# Patient Record
Sex: Female | Born: 1960 | Race: White | Hispanic: No | Marital: Married | Smoking: Never smoker
Health system: Southern US, Community
[De-identification: ages and names within clinical notes are randomized; demographics above are authoritative.]

## PROBLEM LIST (undated history)

## (undated) DIAGNOSIS — H60543 Acute eczematoid otitis externa, bilateral: Secondary | ICD-10-CM

## (undated) DIAGNOSIS — J45909 Unspecified asthma, uncomplicated: Secondary | ICD-10-CM

## (undated) DIAGNOSIS — E119 Type 2 diabetes mellitus without complications: Secondary | ICD-10-CM

## (undated) DIAGNOSIS — F419 Anxiety disorder, unspecified: Secondary | ICD-10-CM

## (undated) DIAGNOSIS — R112 Nausea with vomiting, unspecified: Secondary | ICD-10-CM

## (undated) DIAGNOSIS — H9190 Unspecified hearing loss, unspecified ear: Secondary | ICD-10-CM

## (undated) DIAGNOSIS — E039 Hypothyroidism, unspecified: Secondary | ICD-10-CM

## (undated) DIAGNOSIS — I472 Ventricular tachycardia: Secondary | ICD-10-CM

## (undated) DIAGNOSIS — E785 Hyperlipidemia, unspecified: Secondary | ICD-10-CM

## (undated) DIAGNOSIS — N3090 Cystitis, unspecified without hematuria: Secondary | ICD-10-CM

## (undated) DIAGNOSIS — R000 Tachycardia, unspecified: Secondary | ICD-10-CM

## (undated) DIAGNOSIS — J45991 Cough variant asthma: Principal | ICD-10-CM

## (undated) DIAGNOSIS — N3010 Interstitial cystitis (chronic) without hematuria: Secondary | ICD-10-CM

## (undated) DIAGNOSIS — K219 Gastro-esophageal reflux disease without esophagitis: Secondary | ICD-10-CM

## (undated) DIAGNOSIS — K559 Vascular disorder of intestine, unspecified: Secondary | ICD-10-CM

## (undated) DIAGNOSIS — F329 Major depressive disorder, single episode, unspecified: Secondary | ICD-10-CM

## (undated) DIAGNOSIS — M109 Gout, unspecified: Secondary | ICD-10-CM

## (undated) DIAGNOSIS — N809 Endometriosis, unspecified: Secondary | ICD-10-CM

## (undated) DIAGNOSIS — R0600 Dyspnea, unspecified: Secondary | ICD-10-CM

## (undated) DIAGNOSIS — M8580 Other specified disorders of bone density and structure, unspecified site: Secondary | ICD-10-CM

## (undated) DIAGNOSIS — M797 Fibromyalgia: Secondary | ICD-10-CM

## (undated) DIAGNOSIS — N939 Abnormal uterine and vaginal bleeding, unspecified: Secondary | ICD-10-CM

## (undated) DIAGNOSIS — Z9889 Other specified postprocedural states: Secondary | ICD-10-CM

## (undated) DIAGNOSIS — H269 Unspecified cataract: Secondary | ICD-10-CM

## (undated) DIAGNOSIS — M3500 Sicca syndrome, unspecified: Secondary | ICD-10-CM

## (undated) DIAGNOSIS — E7800 Pure hypercholesterolemia, unspecified: Secondary | ICD-10-CM

## (undated) DIAGNOSIS — H8109 Meniere's disease, unspecified ear: Secondary | ICD-10-CM

## (undated) DIAGNOSIS — Z974 Presence of external hearing-aid: Secondary | ICD-10-CM

## (undated) DIAGNOSIS — I493 Ventricular premature depolarization: Secondary | ICD-10-CM

## (undated) DIAGNOSIS — J189 Pneumonia, unspecified organism: Secondary | ICD-10-CM

## (undated) DIAGNOSIS — I499 Cardiac arrhythmia, unspecified: Secondary | ICD-10-CM

## (undated) DIAGNOSIS — I4729 Other ventricular tachycardia: Secondary | ICD-10-CM

## (undated) DIAGNOSIS — I10 Essential (primary) hypertension: Secondary | ICD-10-CM

## (undated) DIAGNOSIS — H9319 Tinnitus, unspecified ear: Secondary | ICD-10-CM

## (undated) DIAGNOSIS — K589 Irritable bowel syndrome without diarrhea: Secondary | ICD-10-CM

## (undated) DIAGNOSIS — E079 Disorder of thyroid, unspecified: Secondary | ICD-10-CM

## (undated) DIAGNOSIS — E240 Pituitary-dependent Cushing's disease: Secondary | ICD-10-CM

## (undated) DIAGNOSIS — M1990 Unspecified osteoarthritis, unspecified site: Secondary | ICD-10-CM

## (undated) DIAGNOSIS — J302 Other seasonal allergic rhinitis: Secondary | ICD-10-CM

## (undated) DIAGNOSIS — F32A Depression, unspecified: Secondary | ICD-10-CM

## (undated) HISTORY — DX: Disorder of thyroid, unspecified: E07.9

## (undated) HISTORY — PX: ENDOMETRIAL BIOPSY: SHX622

## (undated) HISTORY — PX: DILATION AND CURETTAGE OF UTERUS: SHX78

## (undated) HISTORY — PX: EYE SURGERY: SHX253

## (undated) HISTORY — PX: STAPEDECTOMY: SHX2435

## (undated) HISTORY — PX: OTHER SURGICAL HISTORY: SHX169

## (undated) HISTORY — PX: NASAL SEPTUM SURGERY: SHX37

## (undated) HISTORY — PX: TUBAL LIGATION: SHX77

## (undated) HISTORY — PX: CYSTOSCOPY: SUR368

## (undated) HISTORY — DX: Depression, unspecified: F32.A

## (undated) HISTORY — DX: Endometriosis, unspecified: N80.9

## (undated) HISTORY — DX: Unspecified asthma, uncomplicated: J45.909

## (undated) HISTORY — PX: TONSILLECTOMY AND ADENOIDECTOMY: SUR1326

## (undated) HISTORY — PX: DIAGNOSTIC LAPAROSCOPY: SUR761

## (undated) HISTORY — DX: Irritable bowel syndrome, unspecified: K58.9

## (undated) HISTORY — DX: Cough variant asthma: J45.991

## (undated) HISTORY — DX: Abnormal uterine and vaginal bleeding, unspecified: N93.9

## (undated) HISTORY — PX: TONSILLECTOMY: SUR1361

## (undated) HISTORY — PX: WISDOM TOOTH EXTRACTION: SHX21

## (undated) HISTORY — DX: Major depressive disorder, single episode, unspecified: F32.9

## (undated) HISTORY — DX: Unspecified osteoarthritis, unspecified site: M19.90

## (undated) HISTORY — DX: Hyperlipidemia, unspecified: E78.5

## (undated) HISTORY — DX: Morbid (severe) obesity due to excess calories: E66.01

## (undated) HISTORY — DX: Anxiety disorder, unspecified: F41.9

## (undated) HISTORY — DX: Essential (primary) hypertension: I10

---

## 1982-05-23 DIAGNOSIS — M350A Sjogren syndrome with glomerular disease: Secondary | ICD-10-CM | POA: Insufficient documentation

## 1983-12-24 HISTORY — PX: MYELOGRAM: SHX5347

## 1993-12-23 DIAGNOSIS — R000 Tachycardia, unspecified: Secondary | ICD-10-CM | POA: Insufficient documentation

## 1998-04-27 ENCOUNTER — Other Ambulatory Visit: Admission: RE | Admit: 1998-04-27 | Discharge: 1998-04-27 | Payer: Self-pay | Admitting: *Deleted

## 1999-05-10 ENCOUNTER — Other Ambulatory Visit: Admission: RE | Admit: 1999-05-10 | Discharge: 1999-05-10 | Payer: Self-pay | Admitting: *Deleted

## 2000-05-26 ENCOUNTER — Other Ambulatory Visit: Admission: RE | Admit: 2000-05-26 | Discharge: 2000-05-26 | Payer: Self-pay | Admitting: *Deleted

## 2000-10-07 ENCOUNTER — Ambulatory Visit (HOSPITAL_COMMUNITY): Admission: RE | Admit: 2000-10-07 | Discharge: 2000-10-07 | Payer: Self-pay | Admitting: Emergency Medicine

## 2001-02-13 ENCOUNTER — Encounter: Payer: Self-pay | Admitting: Internal Medicine

## 2001-02-13 ENCOUNTER — Inpatient Hospital Stay (HOSPITAL_COMMUNITY): Admission: EM | Admit: 2001-02-13 | Discharge: 2001-02-14 | Payer: Self-pay | Admitting: Emergency Medicine

## 2001-02-13 ENCOUNTER — Encounter: Payer: Self-pay | Admitting: Emergency Medicine

## 2001-05-13 ENCOUNTER — Other Ambulatory Visit: Admission: RE | Admit: 2001-05-13 | Discharge: 2001-05-13 | Payer: Self-pay | Admitting: *Deleted

## 2001-07-31 ENCOUNTER — Encounter (INDEPENDENT_AMBULATORY_CARE_PROVIDER_SITE_OTHER): Payer: Self-pay | Admitting: Specialist

## 2001-07-31 ENCOUNTER — Other Ambulatory Visit: Admission: RE | Admit: 2001-07-31 | Discharge: 2001-07-31 | Payer: Self-pay | Admitting: Otolaryngology

## 2002-01-28 ENCOUNTER — Encounter: Admission: RE | Admit: 2002-01-28 | Discharge: 2002-01-28 | Payer: Self-pay | Admitting: Emergency Medicine

## 2002-01-28 ENCOUNTER — Encounter: Payer: Self-pay | Admitting: Emergency Medicine

## 2002-04-14 ENCOUNTER — Other Ambulatory Visit: Admission: RE | Admit: 2002-04-14 | Discharge: 2002-04-14 | Payer: Self-pay | Admitting: *Deleted

## 2003-04-06 ENCOUNTER — Other Ambulatory Visit: Admission: RE | Admit: 2003-04-06 | Discharge: 2003-04-06 | Payer: Self-pay | Admitting: *Deleted

## 2004-05-03 ENCOUNTER — Other Ambulatory Visit: Admission: RE | Admit: 2004-05-03 | Discharge: 2004-05-03 | Payer: Self-pay | Admitting: *Deleted

## 2005-05-28 ENCOUNTER — Other Ambulatory Visit: Admission: RE | Admit: 2005-05-28 | Discharge: 2005-05-28 | Payer: Self-pay | Admitting: *Deleted

## 2006-07-16 ENCOUNTER — Other Ambulatory Visit: Admission: RE | Admit: 2006-07-16 | Discharge: 2006-07-16 | Payer: Self-pay | Admitting: *Deleted

## 2007-06-05 ENCOUNTER — Other Ambulatory Visit: Admission: RE | Admit: 2007-06-05 | Discharge: 2007-06-05 | Payer: Self-pay | Admitting: *Deleted

## 2008-06-06 ENCOUNTER — Other Ambulatory Visit: Admission: RE | Admit: 2008-06-06 | Discharge: 2008-06-06 | Payer: Self-pay | Admitting: Obstetrics and Gynecology

## 2008-12-23 DIAGNOSIS — H9313 Tinnitus, bilateral: Secondary | ICD-10-CM | POA: Insufficient documentation

## 2009-06-08 ENCOUNTER — Other Ambulatory Visit: Admission: RE | Admit: 2009-06-08 | Discharge: 2009-06-08 | Payer: Self-pay | Admitting: Obstetrics and Gynecology

## 2010-06-05 ENCOUNTER — Other Ambulatory Visit: Admission: RE | Admit: 2010-06-05 | Discharge: 2010-06-05 | Payer: Self-pay | Admitting: Obstetrics and Gynecology

## 2011-06-06 ENCOUNTER — Other Ambulatory Visit: Payer: Self-pay | Admitting: Obstetrics and Gynecology

## 2011-06-06 ENCOUNTER — Other Ambulatory Visit (HOSPITAL_COMMUNITY)
Admission: RE | Admit: 2011-06-06 | Discharge: 2011-06-06 | Disposition: A | Discharge status: Home / Self Care | Payer: PRIVATE HEALTH INSURANCE | Source: Ambulatory Visit | Attending: Obstetrics and Gynecology | Admitting: Obstetrics and Gynecology

## 2011-06-06 DIAGNOSIS — Z01419 Encounter for gynecological examination (general) (routine) without abnormal findings: Secondary | ICD-10-CM | POA: Insufficient documentation

## 2012-05-13 DIAGNOSIS — N3010 Interstitial cystitis (chronic) without hematuria: Secondary | ICD-10-CM | POA: Insufficient documentation

## 2012-05-13 DIAGNOSIS — R109 Unspecified abdominal pain: Secondary | ICD-10-CM | POA: Insufficient documentation

## 2012-05-13 DIAGNOSIS — R350 Frequency of micturition: Secondary | ICD-10-CM | POA: Insufficient documentation

## 2012-05-13 DIAGNOSIS — N3020 Other chronic cystitis without hematuria: Secondary | ICD-10-CM | POA: Insufficient documentation

## 2012-05-13 DIAGNOSIS — K589 Irritable bowel syndrome without diarrhea: Secondary | ICD-10-CM | POA: Insufficient documentation

## 2012-05-13 DIAGNOSIS — IMO0001 Reserved for inherently not codable concepts without codable children: Secondary | ICD-10-CM | POA: Insufficient documentation

## 2012-05-13 DIAGNOSIS — R300 Dysuria: Secondary | ICD-10-CM | POA: Insufficient documentation

## 2012-05-13 DIAGNOSIS — R3915 Urgency of urination: Secondary | ICD-10-CM | POA: Insufficient documentation

## 2012-06-08 ENCOUNTER — Other Ambulatory Visit: Payer: Self-pay | Admitting: Obstetrics and Gynecology

## 2012-06-08 ENCOUNTER — Other Ambulatory Visit (HOSPITAL_COMMUNITY)
Admission: RE | Admit: 2012-06-08 | Discharge: 2012-06-08 | Disposition: A | Discharge status: Home / Self Care | Payer: PRIVATE HEALTH INSURANCE | Source: Ambulatory Visit | Attending: Obstetrics and Gynecology | Admitting: Obstetrics and Gynecology

## 2012-06-08 DIAGNOSIS — Z1159 Encounter for screening for other viral diseases: Secondary | ICD-10-CM | POA: Insufficient documentation

## 2012-06-08 DIAGNOSIS — Z01419 Encounter for gynecological examination (general) (routine) without abnormal findings: Secondary | ICD-10-CM | POA: Insufficient documentation

## 2012-07-08 DIAGNOSIS — H109 Unspecified conjunctivitis: Secondary | ICD-10-CM | POA: Insufficient documentation

## 2012-07-08 DIAGNOSIS — IMO0002 Reserved for concepts with insufficient information to code with codable children: Secondary | ICD-10-CM | POA: Insufficient documentation

## 2012-08-12 DIAGNOSIS — H04123 Dry eye syndrome of bilateral lacrimal glands: Secondary | ICD-10-CM | POA: Insufficient documentation

## 2012-09-25 ENCOUNTER — Other Ambulatory Visit: Payer: Self-pay | Admitting: Otolaryngology

## 2012-09-25 DIAGNOSIS — H905 Unspecified sensorineural hearing loss: Secondary | ICD-10-CM

## 2012-09-25 DIAGNOSIS — H908 Mixed conductive and sensorineural hearing loss, unspecified: Secondary | ICD-10-CM

## 2012-10-06 ENCOUNTER — Ambulatory Visit
Admission: RE | Admit: 2012-10-06 | Discharge: 2012-10-06 | Disposition: A | Discharge status: Home / Self Care | Payer: BC Managed Care – PPO | Source: Ambulatory Visit | Attending: Otolaryngology | Admitting: Otolaryngology

## 2012-10-06 DIAGNOSIS — H905 Unspecified sensorineural hearing loss: Secondary | ICD-10-CM

## 2012-10-06 DIAGNOSIS — H908 Mixed conductive and sensorineural hearing loss, unspecified: Secondary | ICD-10-CM

## 2012-10-06 MED ORDER — GADOBENATE DIMEGLUMINE 529 MG/ML IV SOLN
20.0000 mL | Freq: Once | INTRAVENOUS | Status: AC | PRN
  Administered 2012-10-06: 20 mL via INTRAVENOUS

## 2012-12-23 HISTORY — PX: OTHER SURGICAL HISTORY: SHX169

## 2013-06-10 ENCOUNTER — Other Ambulatory Visit: Payer: Self-pay | Admitting: Obstetrics and Gynecology

## 2013-06-10 ENCOUNTER — Other Ambulatory Visit (HOSPITAL_COMMUNITY)
Admission: RE | Admit: 2013-06-10 | Discharge: 2013-06-10 | Disposition: A | Discharge status: Home / Self Care | Payer: BC Managed Care – PPO | Source: Ambulatory Visit | Attending: Obstetrics and Gynecology | Admitting: Obstetrics and Gynecology

## 2013-06-10 DIAGNOSIS — Z01419 Encounter for gynecological examination (general) (routine) without abnormal findings: Secondary | ICD-10-CM | POA: Insufficient documentation

## 2013-12-23 DIAGNOSIS — E119 Type 2 diabetes mellitus without complications: Secondary | ICD-10-CM | POA: Insufficient documentation

## 2014-06-07 ENCOUNTER — Encounter: Payer: BC Managed Care – PPO | Attending: Family Medicine

## 2014-06-07 VITALS — Ht 66.0 in | Wt 353.9 lb

## 2014-06-07 DIAGNOSIS — E119 Type 2 diabetes mellitus without complications: Secondary | ICD-10-CM | POA: Diagnosis present

## 2014-06-07 DIAGNOSIS — Z713 Dietary counseling and surveillance: Secondary | ICD-10-CM | POA: Diagnosis not present

## 2014-06-13 ENCOUNTER — Other Ambulatory Visit: Payer: Self-pay | Admitting: Obstetrics and Gynecology

## 2014-06-13 ENCOUNTER — Other Ambulatory Visit (HOSPITAL_COMMUNITY)
Admission: RE | Admit: 2014-06-13 | Discharge: 2014-06-13 | Disposition: A | Discharge status: Home / Self Care | Payer: BC Managed Care – PPO | Source: Ambulatory Visit | Attending: Obstetrics and Gynecology | Admitting: Obstetrics and Gynecology

## 2014-06-13 DIAGNOSIS — Z01419 Encounter for gynecological examination (general) (routine) without abnormal findings: Secondary | ICD-10-CM | POA: Insufficient documentation

## 2014-06-14 DIAGNOSIS — E119 Type 2 diabetes mellitus without complications: Secondary | ICD-10-CM | POA: Diagnosis not present

## 2014-06-14 LAB — CYTOLOGY - PAP

## 2014-06-14 NOTE — Progress Notes (Signed)
Patient was seen on 06/07/14 for the first of a series of three diabetes self-management courses at the [facility].  Current HbA1c: 6.6%  The following learning objectives were met by the patient during this class:  Describe diabetes  State some common risk factors for diabetes  Defines the role of glucose and insulin  Identifies type of diabetes and pathophysiology  Describe the relationship between diabetes and cardiovascular risk  State the members of the Healthcare Team  States the rationale for glucose monitoring  State when to test glucose  State their individual Target Range  State the importance of logging glucose readings  Describe how to interpret glucose readings  Identifies A1C target  Explain the correlation between A1c and eAG values  State symptoms and treatment of high blood glucose  State symptoms and treatment of low blood glucose  Explain proper technique for glucose testing  Identifies proper sharps disposal  Handouts given during class include:  Living Well with Diabetes book  Carb Counting and Meal Planning book  Meal Plan Card  Carbohydrate guide  Meal planning worksheet  Low Sodium Flavoring Tips  The diabetes portion plate  A1c to eAG Conversion Chart  Diabetes Medications  Diabetes Recommended Care Schedule  Support Group  Diabetes Success Plan  Core Class Satisfaction Survey  Follow-Up Plan:  Attend core 2    

## 2014-06-14 NOTE — Progress Notes (Signed)

## 2014-06-21 DIAGNOSIS — E119 Type 2 diabetes mellitus without complications: Secondary | ICD-10-CM | POA: Diagnosis not present

## 2014-06-21 NOTE — Progress Notes (Signed)
Patient was seen on 06/21/14 for the third of a series of three diabetes self-management courses at the [facility]. The following learning objectives were met by the patient during this class:    State the amount of activity recommended for healthy living   Describe activities suitable for individual needs   Identify ways to regularly incorporate activity into daily life   Identify barriers to activity and ways to over come these barriers  Identify diabetes medications being personally used and their primary action for lowering glucose and possible side effects   Describe role of stress on blood glucose and develop strategies to address psychosocial issues   Identify diabetes complications and ways to prevent them  Explain how to manage diabetes during illness   Evaluate success in meeting personal goal   Establish 2-3 goals that they will plan to diligently work on until they return for the  57-monthfollow-up visit  Goals:  Follow Diabetes Meal Plan as instructed  Aim for 15-30 mins of physical activity daily as tolerated  Bring food record and glucose log to your follow up visit  Your patient has established the following 4 month goals in their individualized success plan: I will increase my activity level at least 3 days a week I will take my diabetes medications as scheduled, Metformin after meals instead of before  Your patient has identified these potential barriers to change:  Osteoarthritis in knees is obstacle for exercise  Your patient has identified their diabetes self-care support plan as  [facility]  Doctor, co-workers and husband  Plan:  Attend Core 4 in 4 months

## 2014-10-24 ENCOUNTER — Encounter: Payer: BC Managed Care – PPO | Attending: Family Medicine

## 2014-10-24 DIAGNOSIS — Z713 Dietary counseling and surveillance: Secondary | ICD-10-CM | POA: Insufficient documentation

## 2014-10-24 DIAGNOSIS — E119 Type 2 diabetes mellitus without complications: Secondary | ICD-10-CM | POA: Diagnosis present

## 2014-10-24 NOTE — Progress Notes (Signed)
Appt start time: 1730 end time:  1830.  Patient was seen on 10/24/14 for a review of the series of three diabetes self-management courses at the [facility]. The following learning objectives were met by the patient during this class:  . Reviewed blood glucose monitoring and interpretation including the recommended target ranges and Hgb A1c.  . Reviewed on carb counting, importance of regularly scheduled meals/snacks, and meal planning.  . Reviewed the effects of physical activity on glucose levels and long-term glucose control.  Recommended goal of 150 minutes of physical activity/week. . Reviewed patient medications and discussed role of medication on blood glucose and possible side effects. . Discussed strategies to manage stress, psychosocial issues, and other obstacles to diabetes management. . Encouraged moderate weight reduction to improve glucose levels.   . Reviewed short-term complications: hyper- and hypo-glycemia.  Discussed causes, symptoms, and treatment options. . Reviewed prevention, detection, and treatment of long-term complications.  Discussed the role of prolonged elevated glucose levels on body systems.  Goals:  Follow Diabetes Meal Plan as instructed  Eat 3 meals and 2 snacks, every 3-5 hrs  Limit carbohydrate intake to 45 grams carbohydrate/meal Limit carbohydrate intake to 15 grams carbohydrate/snack Add lean protein foods to meals/snacks  Monitor glucose levels as instructed by your doctor  Aim for goal of 15-30 mins of physical activity daily as tolerated  Bring food record and glucose log to your next nutrition visit   

## 2015-06-14 ENCOUNTER — Other Ambulatory Visit: Payer: Self-pay | Admitting: Obstetrics and Gynecology

## 2015-06-14 ENCOUNTER — Other Ambulatory Visit (HOSPITAL_COMMUNITY)
Admission: RE | Admit: 2015-06-14 | Discharge: 2015-06-14 | Disposition: A | Discharge status: Home / Self Care | Payer: BLUE CROSS/BLUE SHIELD | Source: Ambulatory Visit | Attending: Obstetrics and Gynecology | Admitting: Obstetrics and Gynecology

## 2015-06-14 DIAGNOSIS — Z01419 Encounter for gynecological examination (general) (routine) without abnormal findings: Secondary | ICD-10-CM | POA: Diagnosis not present

## 2015-06-16 LAB — CYTOLOGY - PAP

## 2015-06-20 ENCOUNTER — Other Ambulatory Visit: Payer: Self-pay | Admitting: Family Medicine

## 2015-06-20 ENCOUNTER — Ambulatory Visit
Admission: RE | Admit: 2015-06-20 | Discharge: 2015-06-20 | Disposition: A | Discharge status: Home / Self Care | Payer: BLUE CROSS/BLUE SHIELD | Source: Ambulatory Visit | Attending: Family Medicine | Admitting: Family Medicine

## 2015-06-20 DIAGNOSIS — R059 Cough, unspecified: Secondary | ICD-10-CM

## 2015-06-20 DIAGNOSIS — R05 Cough: Secondary | ICD-10-CM

## 2016-05-23 ENCOUNTER — Other Ambulatory Visit: Payer: Self-pay | Admitting: Nurse Practitioner

## 2016-05-23 DIAGNOSIS — N950 Postmenopausal bleeding: Secondary | ICD-10-CM

## 2016-05-28 ENCOUNTER — Other Ambulatory Visit: Payer: BLUE CROSS/BLUE SHIELD

## 2016-05-31 ENCOUNTER — Other Ambulatory Visit: Payer: BLUE CROSS/BLUE SHIELD

## 2016-07-11 ENCOUNTER — Other Ambulatory Visit (HOSPITAL_COMMUNITY)
Admission: RE | Admit: 2016-07-11 | Discharge: 2016-07-11 | Disposition: A | Discharge status: Home / Self Care | Payer: PRIVATE HEALTH INSURANCE | Source: Ambulatory Visit | Attending: Obstetrics and Gynecology | Admitting: Obstetrics and Gynecology

## 2016-07-11 ENCOUNTER — Other Ambulatory Visit: Payer: Self-pay | Admitting: Obstetrics and Gynecology

## 2016-07-11 DIAGNOSIS — Z01419 Encounter for gynecological examination (general) (routine) without abnormal findings: Secondary | ICD-10-CM | POA: Insufficient documentation

## 2016-07-11 DIAGNOSIS — Z1151 Encounter for screening for human papillomavirus (HPV): Secondary | ICD-10-CM | POA: Diagnosis present

## 2016-07-12 LAB — CYTOLOGY - PAP

## 2016-07-25 ENCOUNTER — Ambulatory Visit
Admission: RE | Admit: 2016-07-25 | Discharge: 2016-07-25 | Disposition: A | Discharge status: Home / Self Care | Payer: PRIVATE HEALTH INSURANCE | Source: Ambulatory Visit | Attending: Nurse Practitioner | Admitting: Nurse Practitioner

## 2016-07-25 DIAGNOSIS — N950 Postmenopausal bleeding: Secondary | ICD-10-CM

## 2016-10-24 ENCOUNTER — Ambulatory Visit
Admission: RE | Admit: 2016-10-24 | Discharge: 2016-10-24 | Disposition: A | Discharge status: Home / Self Care | Payer: PRIVATE HEALTH INSURANCE | Source: Ambulatory Visit | Attending: Chiropractor | Admitting: Chiropractor

## 2016-10-24 ENCOUNTER — Other Ambulatory Visit: Payer: Self-pay | Admitting: Chiropractor

## 2016-10-24 DIAGNOSIS — M542 Cervicalgia: Secondary | ICD-10-CM

## 2016-10-24 DIAGNOSIS — M545 Low back pain: Secondary | ICD-10-CM

## 2018-01-08 ENCOUNTER — Telehealth (HOSPITAL_COMMUNITY): Payer: Self-pay | Admitting: Family Medicine

## 2018-01-09 ENCOUNTER — Other Ambulatory Visit: Payer: Self-pay | Admitting: Family Medicine

## 2018-01-09 DIAGNOSIS — R0602 Shortness of breath: Secondary | ICD-10-CM

## 2018-01-12 NOTE — Telephone Encounter (Signed)
User: Cherie Dark A Date/time: 01/08/18 3:33 PM  Comment: Called pt and lmsg for her to CB to get sch for echo.Vassie Moment  Context:  Outcome: Left Message  Phone number: [phone] Phone Type: Home Phone  Comm. type: Telephone Call type: Outgoing  Contact: Owens Shark, Israa H Relation to patient: Self

## 2018-01-16 ENCOUNTER — Ambulatory Visit (HOSPITAL_COMMUNITY): Payer: PRIVATE HEALTH INSURANCE | Attending: Internal Medicine

## 2018-01-16 ENCOUNTER — Other Ambulatory Visit: Payer: Self-pay

## 2018-01-16 DIAGNOSIS — E785 Hyperlipidemia, unspecified: Secondary | ICD-10-CM | POA: Insufficient documentation

## 2018-01-16 DIAGNOSIS — R0602 Shortness of breath: Secondary | ICD-10-CM | POA: Diagnosis present

## 2018-01-16 DIAGNOSIS — I119 Hypertensive heart disease without heart failure: Secondary | ICD-10-CM | POA: Insufficient documentation

## 2018-01-16 DIAGNOSIS — Z6841 Body Mass Index (BMI) 40.0 and over, adult: Secondary | ICD-10-CM | POA: Diagnosis not present

## 2018-01-16 DIAGNOSIS — E119 Type 2 diabetes mellitus without complications: Secondary | ICD-10-CM | POA: Insufficient documentation

## 2018-04-02 ENCOUNTER — Encounter: Payer: Self-pay | Admitting: Internal Medicine

## 2018-04-02 ENCOUNTER — Ambulatory Visit (INDEPENDENT_AMBULATORY_CARE_PROVIDER_SITE_OTHER): Payer: PRIVATE HEALTH INSURANCE | Admitting: Internal Medicine

## 2018-04-02 ENCOUNTER — Ambulatory Visit (INDEPENDENT_AMBULATORY_CARE_PROVIDER_SITE_OTHER)
Admission: RE | Admit: 2018-04-02 | Discharge: 2018-04-02 | Disposition: A | Discharge status: Home / Self Care | Payer: PRIVATE HEALTH INSURANCE | Source: Ambulatory Visit | Attending: Internal Medicine | Admitting: Internal Medicine

## 2018-04-02 VITALS — BP 126/64 | HR 100 | Ht 66.5 in | Wt 381.0 lb

## 2018-04-02 DIAGNOSIS — J45991 Cough variant asthma: Secondary | ICD-10-CM | POA: Diagnosis not present

## 2018-04-02 DIAGNOSIS — R0609 Other forms of dyspnea: Secondary | ICD-10-CM | POA: Diagnosis not present

## 2018-04-02 HISTORY — DX: Cough variant asthma: J45.991

## 2018-04-02 LAB — NITRIC OXIDE: Nitric Oxide: 11

## 2018-04-02 MED ORDER — BUDESONIDE-FORMOTEROL FUMARATE 80-4.5 MCG/ACT IN AERO: 2.0000 | INHALATION_SPRAY | Freq: Two times a day (BID) | RESPIRATORY_TRACT | 11 refills | Status: DC

## 2018-04-02 NOTE — Patient Instructions (Addendum)
Stop advair   Start Symbicort 80 Take 1  puffs first thing in am and then another 1 puffs about 12 hours later   Work on inhaler technique:  relax and gently blow all the way out then take a nice smooth deep breath back in, triggering the inhaler at same time you start breathing in.  Hold for up to 5 seconds if you can. Blow out thru nose. Rinse and gargle with water when done     Only use your albuterol as a rescue medication to be used if you can't catch your breath by resting or doing a relaxed purse lip breathing pattern.  - The less you use it, the better it will work when you need it. - Ok to use up to 2 puffs  every 4 hours if you must but call for immediate appointment if use goes up over your usual need - Don't leave home without it !!  (think of it like the spare tire for your car)    GERD (REFLUX)  is an extremely common cause of respiratory symptoms just like yours , many times with no obvious heartburn at all.    It can be treated with medication, but also with lifestyle changes including elevation of the head of your bed (ideally with 6 inch  bed blocks),  Smoking cessation, avoidance of late meals, excessive alcohol, and avoid fatty foods, chocolate, peppermint, colas, red wine, and acidic juices such as orange juice.  NO MINT OR MENTHOL PRODUCTS SO NO COUGH DROPS   USE SUGARLESS CANDY INSTEAD (Jolley ranchers or Stover's or Life Savers) or even ice chips will also do - the key is to swallow to prevent all throat clearing. NO OIL BASED VITAMINS - use powdered substitutes.    Please schedule a follow up office visit in 4 weeks, sooner if needed with pfts on returnfd

## 2018-04-02 NOTE — Progress Notes (Signed)
Subjective:     Patient ID: Kelly Thornton, female   DOB: 11/03/1961,     MRN: [id number]  HPI  57 yowf never smoker works as Glass blower/designer for [employer] with sinus problems at age 57 = unable to breath thru nose but subsequently pattern of freq sinusitis developed req rx up to 4 x years then around age 57 developed more of a chronic asthma pattern on advair around 2001 and on it ever since and starting in Sept 2018 more doe so referred to pulmonary clinic 04/02/2018 by Dr   Rosario Jacks for Sjogren's     04/02/2018 1st [facility] office visit/ Meshilem Machuca   Chief Complaint  Patient presents with  . Pulmonary Consult    Referred by Dr. [name]. Pt states she was dxed with Asthma in 1990. She has had increased SOB and wheezing x 8 months.   new onset sob assoc with throat clearing  X sept 2018 / ok sleeping on side  Also chronic dry mouth secondary to Sjogren's Using rescue inhaler once a month now avg  twice daily / worse with lysol exp  Doe = MMRC3 = can't walk 100 yards even at a slow pace at a flat grade s stopping due to sob                                                No obvious patterns in  day to day or daytime variability or assoc excess/ purulent sputum or mucus plugs or hemoptysis or cp or chest tightness,   or overt sinus or hb symptoms. No unusual exposure hx or h/o childhood pna/ asthma or knowledge of premature birth.  Sleeping  Ok flat  without nocturnal  or early am exacerbation  of respiratory  c/o's or need for noct saba. Also denies any obvious fluctuation of symptoms with weather or environmental changes or other aggravating or alleviating factors except as outlined above   Current Allergies, Complete Past Medical History, Past Surgical History, Family History, and Social History were reviewed in [facility] record.  ROS  The following are not active complaints unless bolded Hoarseness, sore throat, dysphagia, dental problems, itching,  sneezing,  nasal congestion or discharge of excess mucus or purulent secretions, ear ache,   fever, chills, sweats, unintended wt loss or wt gain, classically pleuritic or exertional cp,  orthopnea pnd or arm/hand swelling  or leg swelling, presyncope, palpitations, abdominal pain, anorexia, nausea, vomiting, diarrhea  or change in bowel habits or change in bladder habits, change in stools or change in urine, dysuria, hematuria,  rash, arthralgias, visual complaints, headache, numbness, weakness or ataxia or problems with walking or coordination,  change in mood or  memory.        Current Meds  Medication Sig  . albuterol (ACCUNEB) 0.63 MG/3ML nebulizer solution Take 1 ampule by nebulization 3 (three) times daily as needed for wheezing.  [name] albuterol (PROVENTIL HFA;VENTOLIN HFA) 108 (90 BASE) MCG/ACT inhaler Inhale 2 puffs into the lungs every 6 (six) hours as needed for wheezing or shortness of breath.  [name] amitriptyline (ELAVIL) 100 MG tablet Take 100 mg by mouth at bedtime.  . cetirizine (ZYRTEC) 10 MG tablet Take 10 mg by mouth daily.  . cholecalciferol (VITAMIN D) 1000 UNITS tablet Take 2,000 Units by mouth daily.  [name] EPINEPHrine 0.3 mg/0.3  mL IJ SOAJ injection Inject into the muscle as needed.  . Flaxseed, Linseed, (FLAXSEED OIL PO) Take 1 capsule by mouth daily.  . fluocinonide cream (LIDEX) 8.92 % Apply 1 application topically as directed.  . Fluticasone-Salmeterol (ADVAIR) 250-50 MCG/DOSE AEPB Inhale 1 puff into the lungs 2 (two) times daily.  [name] levothyroxine (SYNTHROID, LEVOTHROID) 175 MCG tablet Take 175 mcg by mouth daily before breakfast.  . metFORMIN (GLUCOPHAGE) 500 MG tablet Take 500 mg by mouth daily with breakfast.  . Multiple Vitamin (MULTIVITAMIN) capsule Take 1 capsule by mouth daily.  [name] NASAL SALINE NA Place into the nose as needed.  . pentosan polysulfate (ELMIRON) 100 MG capsule Take 200 mg by mouth 2 (two) times daily.  . rosuvastatin (CRESTOR) 5 MG tablet Take 5 mg by mouth  daily.  [name] triamterene-hydrochlorothiazide (DYAZIDE) 37.5-25 MG capsule Take 1 capsule by mouth daily.  [name] venlafaxine XR (EFFEXOR-XR) 150 MG 24 hr capsule Take 150 mg by mouth daily with breakfast.       Review of Systems     Objective:   Physical Exam   Obese very pleasant amb wf nad but freq vigorous throat clearing   Wt Readings from Last 3 Encounters:  04/02/18 (!) 381 lb (172.8 kg)  06/14/14 (!) 353 lb 14.4 oz (160.5 kg)     Vital signs reviewed - Note on arrival 02 sats  98% on RA      HEENT: nl dentition, turbinates bilaterally, and oropharynx. Nl external ear canals without cough reflex   NECK :  without JVD/Nodes/TM/ nl carotid upstrokes bilaterally   LUNGS: no acc muscle use,  Nl contour chest which is clear to A and P bilaterally without cough on insp or exp maneuvers   CV:  RRR  no s3 or murmur or increase in P2, and  Trace sym Pitting edema both LEs  ABD:  Massively obese  nontender with limited inspiratory excursion . No bruits or organomegaly appreciated, bowel sounds nl  MS:  Nl gait/ ext warm without deformities, calf tenderness, cyanosis or clubbing No obvious joint restrictions   SKIN: warm and dry without lesions    NEURO:  alert, approp, nl sensorium with  no motor or cerebellar deficits apparent.    CXR PA and Lateral:   04/02/2018 :    I personally reviewed images and impression as follows:    cardiomegaly with non-specific mild increase in lung markings.    Assessment:

## 2018-04-02 NOTE — Assessment & Plan Note (Signed)
FENO 04/02/2018  =   11 on advair 250 one bid - Spirometry 04/02/2018  FEV1 2.39 (84%)  Ratio 87 with truncation of peak flow p advair prior  - 04/02/2018  After extensive coaching inhaler device  effectiveness =    75% from a baseline of 50% so try symbicort 80  1-2 bid    Symptoms are refractory and disproportionate to objective findings with DDX of  difficult airways management almost all start with A and  include Adherence, Ace Inhibitors, Acid Reflux, Active Sinus Disease, Alpha 1 Antitripsin deficiency, Anxiety masquerading as Airways dz,  ABPA,  Allergy(esp in young), Aspiration (esp in elderly), Adverse effects of meds,  Active smokers, A bunch of PE's (a small clot burden can't cause this syndrome unless there is already severe underlying pulm or vascular dz with poor reserve) plus two Bs  = Bronchiectasis and Beta blocker use..and one C= CHF      Adherence is always the initial "prime suspect" and is a multilayered concern that requires a "trust but verify" approach in every patient - starting with knowing how to use medications, especially inhalers, correctly, keeping up with refills and understanding the fundamental difference between maintenance and prns vs those medications only taken for a very short course and then stopped and not refilled.  - see hfa teaching    ? Adverse effects of dpi > try off advair dpi and on low dose ICS  symb 80 one bid   ? ? Acid (or non-acid) GERD > always difficult to exclude as up to 75% of pts in some series report no assoc GI/ Heartburn symptoms> rec  diet restrictions/ reviewed and instructions given in writing > consider adding ppi ac  Next   ? Active sinus dz > throat clearing is way in excess of pnds/ f/u but Wilburn Cornelia planned    ? Allergy > consider allergy testing next ov    ? Anxiety / VCD > usually at the bottom of this list of usual suspects but should be much higher on this pt's based on H and P and note already on psychotropics and may  interfere with adherence and also interpretation of response or lack thereof to symptom management which can be quite subjective.    She may now have irritable larynx component to her throat clearing but that would be a dx of exclusion.  Will return p 4 weeks for pfts to complete the w/u.   Total time devoted to counseling  > 50 % of initial 60 min office visit:  review case with pt/ discussion of options/alternatives/ personally creating written customized instructions  in presence of pt  then going over those specific  Instructions directly with the pt including how to use all of the meds but in particular covering each new medication in detail and the difference between the maintenance= "automatic" meds and the prns using an action plan format for the latter (If this problem/symptom => do that organization reading Left to right).  Please see AVS from this visit for a full list of these instructions which I personally wrote for this pt and  are unique to this visit.

## 2018-04-03 ENCOUNTER — Telehealth: Payer: Self-pay | Admitting: Internal Medicine

## 2018-04-03 ENCOUNTER — Encounter: Payer: Self-pay | Admitting: Internal Medicine

## 2018-04-03 DIAGNOSIS — R0609 Other forms of dyspnea: Secondary | ICD-10-CM

## 2018-04-03 NOTE — Progress Notes (Signed)
LMTCB

## 2018-04-03 NOTE — Telephone Encounter (Signed)
Called and spoke with pt letting her know the results of the cxr.  Pt expressed understanding. Nothing further needed at this time.

## 2018-04-03 NOTE — Assessment & Plan Note (Addendum)
Left ventricle: The cavity size was normal. Wall thickness was   increased in a pattern of mild LVH. Systolic function was normal.   The estimated ejection fraction was in the range of 55% to 60%.   The study is not technically sufficient to allow evaluation of LV   diastolic function.  Suspect this is multifactorial but clearly not related to airflow obst by today's spriometry - clearly has limiting obesity and  May also  have significant diastolic dysfunction based on today's cxr and will need further eval p correct the upper airway component of her problem (see separate a/p) with full pfts

## 2018-04-03 NOTE — Assessment & Plan Note (Signed)
Body mass index is 60.57 kg/m.  -   No results found for: TSH   Contributing to gerd risk/ doe/reviewed the need and the process to achieve and maintain neg calorie balance > defer f/u primary care including intermittently monitoring thyroid status

## 2018-05-15 ENCOUNTER — Other Ambulatory Visit: Payer: Self-pay | Admitting: Internal Medicine

## 2018-05-15 DIAGNOSIS — J45991 Cough variant asthma: Secondary | ICD-10-CM

## 2018-05-19 ENCOUNTER — Ambulatory Visit (INDEPENDENT_AMBULATORY_CARE_PROVIDER_SITE_OTHER): Payer: PRIVATE HEALTH INSURANCE | Admitting: Internal Medicine

## 2018-05-19 ENCOUNTER — Other Ambulatory Visit (INDEPENDENT_AMBULATORY_CARE_PROVIDER_SITE_OTHER): Payer: PRIVATE HEALTH INSURANCE

## 2018-05-19 ENCOUNTER — Encounter: Payer: Self-pay | Admitting: Internal Medicine

## 2018-05-19 VITALS — BP 126/76 | HR 103 | Ht 67.0 in | Wt 376.0 lb

## 2018-05-19 DIAGNOSIS — R0609 Other forms of dyspnea: Secondary | ICD-10-CM

## 2018-05-19 DIAGNOSIS — J45991 Cough variant asthma: Secondary | ICD-10-CM | POA: Diagnosis not present

## 2018-05-19 LAB — CBC WITH DIFFERENTIAL/PLATELET
BASOS PCT: 0.4 % (ref 0.0–3.0)
Basophils Absolute: 0 10*3/uL (ref 0.0–0.1)
EOS PCT: 0 % (ref 0.0–5.0)
Eosinophils Absolute: 0 10*3/uL (ref 0.0–0.7)
HEMATOCRIT: 42.6 % (ref 36.0–46.0)
HEMOGLOBIN: 14.2 g/dL (ref 12.0–15.0)
LYMPHS PCT: 28.5 % (ref 12.0–46.0)
Lymphs Abs: 2.6 10*3/uL (ref 0.7–4.0)
MCHC: 33.3 g/dL (ref 30.0–36.0)
MCV: 93.4 fl (ref 78.0–100.0)
MONO ABS: 0.7 10*3/uL (ref 0.1–1.0)
MONOS PCT: 7.6 % (ref 3.0–12.0)
Neutro Abs: 5.7 10*3/uL (ref 1.4–7.7)
Neutrophils Relative %: 63.5 % (ref 43.0–77.0)
Platelets: 270 10*3/uL (ref 150.0–400.0)
RBC: 4.56 Mil/uL (ref 3.87–5.11)
RDW: 14.1 % (ref 11.5–15.5)
WBC: 9 10*3/uL (ref 4.0–10.5)

## 2018-05-19 LAB — PULMONARY FUNCTION TEST
DL/VA % PRED: 122 %
DL/VA: 6.29 ml/min/mmHg/L
DLCO unc % pred: 81 %
DLCO unc: 22.95 ml/min/mmHg
FEF 25-75 POST: 4.75 L/s
FEF 25-75 Pre: 3.21 L/sec
FEF2575-%CHANGE-POST: 48 %
FEF2575-%PRED-PRE: 124 %
FEF2575-%Pred-Post: 184 %
FEV1-%Change-Post: 17 %
FEV1-%Pred-Post: 90 %
FEV1-%Pred-Pre: 76 %
FEV1-PRE: 2.22 L
FEV1-Post: 2.6 L
FEV1FVC-%CHANGE-POST: 6 %
FEV1FVC-%Pred-Pre: 109 %
FEV6-%CHANGE-POST: 11 %
FEV6-%Pred-Post: 79 %
FEV6-%Pred-Pre: 71 %
FEV6-Post: 2.87 L
FEV6-Pre: 2.58 L
FEV6FVC-%Pred-Post: 103 %
FEV6FVC-%Pred-Pre: 103 %
FVC-%Change-Post: 10 %
FVC-%Pred-Post: 77 %
FVC-%Pred-Pre: 70 %
FVC-Post: 2.87 L
FVC-Pre: 2.6 L
POST FEV1/FVC RATIO: 91 %
POST FEV6/FVC RATIO: 100 %
PRE FEV1/FVC RATIO: 85 %
Pre FEV6/FVC Ratio: 100 %
RV % pred: 53 %
RV: 1.14 L
TLC % PRED: 81 %
TLC: 4.51 L

## 2018-05-19 MED ORDER — BUDESONIDE-FORMOTEROL FUMARATE 160-4.5 MCG/ACT IN AERO: 2.0000 | INHALATION_SPRAY | Freq: Two times a day (BID) | RESPIRATORY_TRACT | 0 refills | Status: DC

## 2018-05-19 MED ORDER — PANTOPRAZOLE SODIUM 40 MG PO TBEC: 40.0000 mg | DELAYED_RELEASE_TABLET | Freq: Every day | ORAL | 2 refills | Status: DC

## 2018-05-19 MED ORDER — FAMOTIDINE 20 MG PO TABS: ORAL_TABLET | ORAL | 11 refills | Status: DC

## 2018-05-19 MED ORDER — PREDNISONE 10 MG PO TABS: ORAL_TABLET | ORAL | 0 refills | Status: DC

## 2018-05-19 MED ORDER — BUDESONIDE-FORMOTEROL FUMARATE 160-4.5 MCG/ACT IN AERO: 2.0000 | INHALATION_SPRAY | Freq: Two times a day (BID) | RESPIRATORY_TRACT | 11 refills | Status: DC

## 2018-05-19 NOTE — Progress Notes (Signed)
Subjective:     Patient ID: Kelly Thornton, female   DOB: 1961-04-03,     MRN: [id number]    Brief patient profile:  24 yowf never smoker works as Medical laboratory scientific officer  for [employer] with sinus problems at age 57 = unable to breath thru nose but subsequently pattern of freq sinusitis developed req rx up to 4 x per year then around age 25 developed more of a chronic asthma pattern on advair around 2001 and on it ever since and starting in Sept 2018 more doe so referred to pulmonary clinic 04/02/2018 by Dr   Rosario Jacks for Sjogren's     04/02/2018 1st [facility] office visit/ Philbert Ocallaghan   Chief Complaint  Patient presents with  . Pulmonary Consult    Referred by Dr. [name]. Pt states she was dxed with Asthma in 1990. She has had increased SOB and wheezing x 8 months.   new onset sob assoc with throat clearing  X sept 2018 / ok sleeping on side  Also chronic dry mouth secondary to Sjogren's Using rescue inhaler once a month now avg  twice daily / worse with lysol exp  Doe = MMRC3 = can't walk 100 yards even at a slow pace at a flat grade s stopping due to sob rec Stop advair  Start Symbicort 80 Take 1  puffs first thing in am and then another 1 puffs about 12 hours later Work on inhaler technique:  Only use your albuterol as a rescue medication GERD diet    05/19/2018  f/u ov/Andrick Rust re: asthma f/u  Chief Complaint  Patient presents with  . Follow-up    Increased cough and SOB since 05/03/18. She is coughing up minimal green sputum. She states she gets SOB walking approx 8 steps.   She is using her albuterol inhaler 6 x per day on average and neb about once per wk.   more HB since early may then started cough / sob 05/03/18     And up to 6 x daily saba where previously still several times daily despite symb 80 2bid also assoc nasal congestion   Doe = MMRC3 = can't walk 100 yards even at a slow pace at a flat grade s stopping due to sob   Sleeps ok one pillow   No obvious day to day or  daytime variability or assoc   mucus plugs or hemoptysis or cp or chest tightness, subjective wheeze.  No unusual exposure hx or h/o childhood pna/ asthma or knowledge of premature birth.  Sleeping  Ok   without nocturnal  or early am exacerbation  of respiratory  c/o's or need for noct saba. Also denies any obvious fluctuation of symptoms with weather or environmental changes or other aggravating or alleviating factors except as outlined above   Current Allergies, Complete Past Medical History, Past Surgical History, Family History, and Social History were reviewed in [employer] [facility] record.  ROS  The following are not active complaints unless bolded Hoarseness, sore throat, dysphagia, dental problems, itching, sneezing,  nasal congestion or discharge of excess mucus or purulent secretions, ear ache,   fever, chills, sweats, unintended wt loss or wt gain, classically pleuritic or exertional cp,  orthopnea pnd or arm/hand swelling  or leg swelling, presyncope, palpitations, abdominal pain, anorexia, nausea, vomiting, diarrhea  or change in bowel habits or change in bladder habits, change in stools or change in urine, dysuria, hematuria,  rash, arthralgias, visual complaints, headache, numbness, weakness  or ataxia or problems with walking or coordination,  change in mood or  memory.        Current Meds  Medication Sig  . albuterol (ACCUNEB) 0.63 MG/3ML nebulizer solution Take 1 ampule by nebulization 3 (three) times daily as needed for wheezing.  [name] albuterol (PROVENTIL HFA;VENTOLIN HFA) 108 (90 BASE) MCG/ACT inhaler Inhale 2 puffs into the lungs every 6 (six) hours as needed for wheezing or shortness of breath.  [name] amitriptyline (ELAVIL) 100 MG tablet Take 100 mg by mouth at bedtime.  . cetirizine (ZYRTEC) 10 MG tablet Take 10 mg by mouth daily.  . cholecalciferol (VITAMIN D) 1000 UNITS tablet Take 2,000 Units by mouth daily.  [name] EPINEPHrine 0.3 mg/0.3 mL IJ SOAJ injection Inject  into the muscle as needed.  . Flaxseed, Linseed, (FLAXSEED OIL PO) Take 1 capsule by mouth daily.  . fluocinonide cream (LIDEX) 2.84 % Apply 1 application topically as directed.  . IRBESARTAN PO 1 tablet daily. Unsure of strength  . levothyroxine (SYNTHROID, LEVOTHROID) 175 MCG tablet Take 175 mcg by mouth daily before breakfast.  . metFORMIN (GLUCOPHAGE) 500 MG tablet Take 500 mg by mouth daily with breakfast.  . Multiple Vitamin (MULTIVITAMIN) capsule Take 1 capsule by mouth daily.  [name] NASAL SALINE NA Place into the nose as needed.  . pentosan polysulfate (ELMIRON) 100 MG capsule Take 200 mg by mouth 2 (two) times daily.  . rosuvastatin (CRESTOR) 5 MG tablet Take 5 mg by mouth daily.  [name] triamterene-hydrochlorothiazide (DYAZIDE) 37.5-25 MG capsule Take 1 capsule by mouth daily.  [name] venlafaxine XR (EFFEXOR-XR) 150 MG 24 hr capsule Take 150 mg by mouth daily with breakfast.  .  budesonide-formoterol (SYMBICORT) 80-4.5 MCG/ACT inhaler Inhale 2 puffs into the lungs 2 (two) times daily.                                                      Objective:   Physical Exam   amb obese wf still some throat clearing   05/19/2018           376   04/02/18 (!) 381 lb (172.8 kg)  06/14/14 (!) 353 lb 14.4 oz (160.5 kg)    Vital signs reviewed - Note on arrival 02 sats  96% on RA       HEENT: nl dentition, turbinates bilaterally, and oropharynx. Nl external ear canals without cough reflex   NECK :  without JVD/Nodes/TM/ nl carotid upstrokes bilaterally   LUNGS: no acc muscle use,  Nl contour chest which is clear to A and P bilaterally without cough on insp or exp maneuvers   CV:  RRR  no s3 or murmur or increase in P2, and trace sym  Pitting both LE's   ABD:  soft and nontender with nl inspiratory excursion in the supine position. No bruits or organomegaly appreciated, bowel sounds nl  MS:  Nl gait/ ext warm without deformities, calf tenderness, cyanosis or clubbing No obvious joint restrictions    SKIN: warm and dry without lesions    NEURO:  alert, approp, nl sensorium with  no motor or cerebellar deficits apparent.        I personally reviewed images and agree with radiology impression as follows:  CXR:   04/03/18 Enlargement of cardiac silhouette. No acute abnormalities.   Labs ordered 05/19/2018  Allergy profile  /  BNP     Assessment:

## 2018-05-19 NOTE — Assessment & Plan Note (Signed)
Body mass index is 58.89 kg/m.  -  trending down slightly, encouraged  No results found for: TSH   Contributing to gerd risk/ doe/reviewed the need and the process to achieve and maintain neg calorie balance > defer f/u primary care including intermittently monitoring thyroid status

## 2018-05-19 NOTE — Progress Notes (Signed)
PFT done today. 

## 2018-05-19 NOTE — Assessment & Plan Note (Addendum)
FENO 04/02/2018  =   11 on advair 250 one bid - Spirometry 04/02/2018  FEV1 2.39 (84%)  Ratio 87 with truncation of peak flow p advair prior  - 04/02/2018   try symbicort 80  1-2 bid   - PFT's  05/19/2018  FEV1 2.60 (90 % ) ratio 91  p 17 % improvement from saba p nothing prior to study(since symb 80 x 2 x  12h) with DLCO  81 % corrects to 122  % for alv volume   - 05/19/2018  After extensive coaching inhaler device  effectiveness =    75% from a baseline of 50%  - Allergy profile 05/19/2018 >  Eos 0. /  IgE    Having a typical flare in terms of symptoms though should have been able to completely eliminate using action plan to use saba in multiple forms up to every 4 hours max.  On the other hand, that would suggest her symptoms are indeed dificult to control  DDX of  difficult airways management almost all start with A and  include Adherence, Ace Inhibitors, Acid Reflux, Active Sinus Disease, Alpha 1 Antitripsin deficiency, Anxiety masquerading as Airways dz,  ABPA,  Allergy(esp in young), Aspiration (esp in elderly), Adverse effects of meds,  Active smokers, A bunch of PE's (a small clot burden can't cause this syndrome unless there is already severe underlying pulm or vascular dz with poor reserve) plus two Bs  = Bronchiectasis and Beta blocker use..and one C= CHF - see hfa teaching  Above   ? Allergy > send profile, Prednisone 10 mg take  4 each am x 2 days,   2 each am x 2 days,  1 each am x 2 days and stop and try higher dose symb =160 2bid though noted this may make throat clearing worse.  ? Acid (or non-acid) GERD > always difficult to exclude as up to 75% of pts in some series report no assoc GI/ Heartburn symptoms and she has overt HB > rec max (24h)  acid suppression and diet restrictions/ reviewed and instructions given in writing.    ? Active sinus dz > low threshold for sinus ct   ? Chf/ ? Cardiac asthma > send bnp    I had an extended discussion with the patient reviewing all relevant  studies completed to date and  lasting 15 to 20 minutes of a 25 minute visit    See device teaching which extended face to face time for this visit.  Each maintenance medication was reviewed in detail including emphasizing most importantly the difference between maintenance and prns and under what circumstances the prns are to be triggered using an action plan format that is not reflected in the computer generated alphabetically organized AVS which I have not found useful in most complex patients, especially with respiratory illnesses  Please see AVS for specific instructions unique to this visit that I personally wrote and verbalized to the the pt in detail and then reviewed with pt  by my nurse highlighting any  changes in therapy recommended at today's visit to their plan of care.

## 2018-05-19 NOTE — Patient Instructions (Addendum)
Symbicort 160 Take 2 puffs first thing in am and then another 2 puffs about 12 hours later and if throat clearing gets worse go back to the 80 strength  Work on inhaler technique:  relax and gently blow all the way out then take a nice smooth deep breath back in, triggering the inhaler at same time you start breathing in.  Hold for up to 5 seconds if you can. Blow out thru nose. Rinse and gargle with water when done  Pantoprazole (protonix) 40 mg   Take  30-60 min before first meal of the day and Pepcid (famotidine)  20 mg one @  bedtime until return to office - this is the best way to tell whether stomach acid is contributing to your problem.      If not better pednisone 10 mg take  4 each am x 2 days,   2 each am x 2 days,  1 each am x 2 days and stop   Please schedule a follow up visit in 3 months but call sooner if needed

## 2018-05-20 LAB — INTERPRETATION:

## 2018-05-20 LAB — RESPIRATORY ALLERGY PROFILE REGION II ~~LOC~~
Allergen, A. alternata, m6: <0.1 kU/L
Allergen, Comm Silver Birch, t9: <0.1 kU/L
Allergen, D pternoyssinus,d7: <0.1 kU/L
Allergen, Mouse Urine Protein, e78: <0.1 kU/L
Allergen, Mulberry, t76: <0.1 kU/L
Allergen, Oak,t7: <0.1 kU/L
Box Elder IgE: <0.1 kU/L
CLADOSPORIUM HERBARUM (M2) IGE: <0.1 kU/L
CLASS: 0
CLASS: 0
CLASS: 0
CLASS: 0
CLASS: 0
CLASS: 0
CLASS: 0
CLASS: 0
CLASS: 0
CLASS: 0
COMMON RAGWEED (SHORT) (W1) IGE: <0.1 kU/L
Class: 0
Class: 0
Class: 0
Class: 0
Class: 0
Class: 0
Class: 0
Class: 0
Class: 0
Class: 0
Class: 0
Class: 0
Class: 0
Class: 0
Cockroach: <0.1 kU/L
D. farinae: <0.1 kU/L
Elm IgE: <0.1 kU/L
IgE (Immunoglobulin E), Serum: <2 kU/L (ref <=114)
Johnson Grass: <0.1 kU/L
Pecan/Hickory Tree IgE: <0.1 kU/L
Timothy Grass: <0.1 kU/L

## 2018-05-21 NOTE — Progress Notes (Signed)
Spoke with pt and notified of results per Dr. Wert. Pt verbalized understanding and denied any questions. 

## 2018-06-11 ENCOUNTER — Other Ambulatory Visit: Payer: Self-pay | Admitting: Internal Medicine

## 2018-06-11 MED ORDER — BUDESONIDE-FORMOTEROL FUMARATE 160-4.5 MCG/ACT IN AERO: 2.0000 | INHALATION_SPRAY | Freq: Two times a day (BID) | RESPIRATORY_TRACT | 11 refills | Status: DC

## 2018-08-21 ENCOUNTER — Ambulatory Visit: Payer: PRIVATE HEALTH INSURANCE | Admitting: Internal Medicine

## 2018-08-26 ENCOUNTER — Encounter: Payer: Self-pay | Admitting: Internal Medicine

## 2018-08-26 ENCOUNTER — Ambulatory Visit (INDEPENDENT_AMBULATORY_CARE_PROVIDER_SITE_OTHER): Payer: PRIVATE HEALTH INSURANCE | Admitting: Internal Medicine

## 2018-08-26 VITALS — BP 130/76 | HR 99 | Ht 67.0 in | Wt 376.6 lb

## 2018-08-26 DIAGNOSIS — J45991 Cough variant asthma: Secondary | ICD-10-CM | POA: Diagnosis not present

## 2018-08-26 LAB — NITRIC OXIDE: Nitric Oxide: 13

## 2018-08-26 MED ORDER — BUDESONIDE-FORMOTEROL FUMARATE 80-4.5 MCG/ACT IN AERO: INHALATION_SPRAY | RESPIRATORY_TRACT | 11 refills | Status: DC

## 2018-08-26 NOTE — Patient Instructions (Addendum)
Next refill change to symbicort 80 Take 2 puffs first thing in am and then another 2 puffs about 12 hours later.    Work on inhaler technique:  relax and gently blow all the way out then take a nice smooth deep breath back in, triggering the inhaler at same time you start breathing in.  Hold for up to 5 seconds if you can. Blow out thru nose. Rinse and gargle with water when done     Please schedule a follow up visit in 3 months but call sooner if needed  - add spacer next ov if not better

## 2018-08-26 NOTE — Progress Notes (Signed)
Subjective:     Patient ID: Kelly Thornton, female   DOB: 1961/06/06,     MRN: [id number]    Brief patient profile:  14 yowf never smoker works as Medical laboratory scientific officer  for [employer] with sinus problems at age 56 = unable to breath thru nose but subsequently pattern of freq sinusitis developed req rx up to 4 x per year then around age 31 developed more of a chronic asthma pattern on advair around 2001 and on it ever since and starting in Sept 2018 more doe so referred to pulmonary clinic 04/02/2018 by Dr   Kelly Thornton for Sjogren's     04/02/2018 1st [facility] office visit/ Kelly Thornton   Chief Complaint  Patient presents with  . Pulmonary Consult    Referred by Dr. Dorthy Thornton. Pt states she was dxed with Asthma in 1990. She has had increased SOB and wheezing x 8 months.   new onset sob assoc with throat clearing  X sept 2018 / ok sleeping on side  Also chronic dry mouth secondary to Sjogren's Using rescue inhaler once a month now avg  twice daily / worse with lysol exp  Doe = MMRC3 = can't walk 100 yards even at a slow pace at a flat grade s stopping due to sob rec Stop advair  Start Symbicort 80 Take 1  puffs first thing in am and then another 1 puffs about 12 hours later Work on inhaler technique:  Only use your albuterol as a rescue medication GERD diet    05/19/2018  f/u ov/Kelly Thornton re: asthma f/u  Chief Complaint  Patient presents with  . Follow-up    Increased cough and SOB since 05/03/18. She is coughing up minimal green sputum. She states she gets SOB walking approx 8 steps.   She is using her albuterol inhaler 6 x per day on average and neb about once per wk.   more HB since early may then started cough / sob 05/03/18     And up to 6 x daily saba where previously still several times daily despite symb 80 2bid also assoc nasal congestion  Doe = MMRC3 = can't walk 100 yards even at a slow pace at a flat grade s stopping due to sob   Sleeps ok one pillow  rec Symbicort 160 Take 2 puffs  first thing in am and then another 2 puffs about 12 hours later and if throat clearing gets worse go back to the 80 strength Work on inhaler technique:   Pantoprazole (protonix) 40 mg   Take  30-60 min before first meal of the day and Pepcid (famotidine)  20 mg one @  bedtime until return to office - this is the best way to tell whether stomach acid is contributing to your problem.    If not better pednisone 10 mg take  4 each am x 2 days,   2 each am x 2 days,  1 each am x 2 days and stop      08/26/2018  f/u ov/Kelly Thornton re:  Chief Complaint  Patient presents with  . Follow-up    Pt states she is about the same since last visit. States she is having to use her rescue inhaler twice a day. Pt has c/o cough which is nonproductive, SOB, and chest tightness.  Dyspnea:  Can do HT shopping but needs HC parking / knee limits her from doing  steps   Cough: still some throat clearing Sleeping: ok one pillow  SABA use: twice daily   Once around 4 pm walking to car/ in am too but not clear why  02: none    No obvious day to day or daytime variability or assoc excess/ purulent sputum or mucus plugs or hemoptysis or cp or chest tightness, subjective wheeze or overt sinus or hb symptoms.   Sleeps as above  without nocturnal  or early am exacerbation  of respiratory  c/o's or need for noct saba. Also denies any obvious fluctuation of symptoms with weather or environmental changes or other aggravating or alleviating factors except as outlined above   No unusual exposure hx or h/o childhood pna/ asthma or knowledge of premature birth.  Current Allergies, Complete Past Medical History, Past Surgical History, Family History, and Social History were reviewed in [employer] record.  ROS  The following are not active complaints unless bolded Hoarseness, sore throat, dysphagia, dental problems, itching, sneezing,  nasal congestion or discharge of excess mucus or purulent secretions, ear ache,    fever, chills, sweats, unintended wt loss or wt gain, classically pleuritic or exertional cp,  orthopnea pnd or arm/hand swelling  or leg swelling, presyncope, palpitations, abdominal pain, anorexia, nausea, vomiting, diarrhea  or change in bowel habits or change in bladder habits, change in stools or change in urine, dysuria, hematuria,  rash, arthralgias, visual complaints, headache, numbness, weakness or ataxia or problems with walking or coordination,  change in mood or  memory.        Current Meds  Medication Sig  . albuterol (ACCUNEB) 0.63 MG/3ML nebulizer solution Take 1 ampule by nebulization 3 (three) times daily as needed for wheezing.  [name] albuterol (PROVENTIL HFA;VENTOLIN HFA) 108 (90 BASE) MCG/ACT inhaler Inhale 2 puffs into the lungs every 6 (six) hours as needed for wheezing or shortness of breath.  [name] amitriptyline (ELAVIL) 100 MG tablet Take 100 mg by mouth at bedtime.  . cetirizine (ZYRTEC) 10 MG tablet Take 10 mg by mouth daily.  . cholecalciferol (VITAMIN D) 1000 UNITS tablet Take 2,000 Units by mouth daily.  . Flaxseed, Linseed, (FLAXSEED OIL PO) Take 1 capsule by mouth daily.  . fluocinonide cream (LIDEX) 7.82 % Apply 1 application topically as directed.  . IRBESARTAN PO 1 tablet daily. Unsure of strength  . levothyroxine (SYNTHROID, LEVOTHROID) 175 MCG tablet Take 175 mcg by mouth daily before breakfast.  . metFORMIN (GLUCOPHAGE) 500 MG tablet Take 500 mg by mouth daily with breakfast.  . Multiple Vitamin (MULTIVITAMIN) capsule Take 1 capsule by mouth daily.  [name] NASAL SALINE NA Place into the nose as needed.  . pentosan polysulfate (ELMIRON) 100 MG capsule Take 200 mg by mouth 2 (two) times daily.  . rosuvastatin (CRESTOR) 5 MG tablet Take 5 mg by mouth daily.  [name] triamterene-hydrochlorothiazide (DYAZIDE) 37.5-25 MG capsule Take 1 capsule by mouth daily.  [name] venlafaxine XR (EFFEXOR-XR) 150 MG 24 hr capsule Take 150 mg by mouth daily with breakfast.  . [DISCONTINUED]  budesonide-formoterol (SYMBICORT) 160-4.5 MCG/ACT inhaler Inhale 2 puffs into the lungs 2 (two) times daily.               Objective:   Physical Exam   amb mod hoarse  obese pleasant wf nad occ throat clearing   08/26/2018          376  05/19/2018         376   04/02/18 (!) 381 lb (172.8 kg)  06/14/14 (!) 353 lb 14.4 oz (160.5 kg)  Vital signs reviewed - Note on arrival 02 sats  97% on RA           HEENT: nl dentition, turbinates bilaterally, and oropharynx. Nl external ear canals without cough reflex   NECK :  without JVD/Nodes/TM/ nl carotid upstrokes bilaterally   LUNGS: no acc muscle use,  Nl contour chest which is clear to A and P bilaterally without cough on insp or exp maneuvers   CV:  RRR  no s3 or murmur or increase in P2, and  Trace edema both LE's  ABD:  soft and nontender with nl inspiratory excursion in the supine position. No bruits or organomegaly appreciated, bowel sounds nl  MS:  Nl gait/ ext warm without deformities, calf tenderness, cyanosis or clubbing No obvious joint restrictions   SKIN: warm and dry without lesions    NEURO:  alert, approp, nl sensorium with  no motor or cerebellar deficits apparent.              Assessment:

## 2018-08-27 ENCOUNTER — Encounter: Payer: Self-pay | Admitting: Internal Medicine

## 2018-08-27 NOTE — Assessment & Plan Note (Addendum)
FENO 04/02/2018  =   11 on advair 250 one bid - Spirometry 04/02/2018  FEV1 2.39 (84%)  Ratio 87 with truncation of peak flow p advair prior  - 04/02/2018   try symbicort 80  1-2 bid   - PFT's  05/19/2018  FEV1 2.60 (90 % ) ratio 91  p 17 % improvement from saba p nothing prior to study(since symb 80 x 2 x  12h) with DLCO  81 % corrects to 122  % for alv volume   - 05/19/2018  After extensive coaching inhaler device  effectiveness =    75% from a baseline of 50%  - Allergy profile 05/19/2018 >  Eos 0.0 /  IgE  < 2 RAST neg   - 08/26/2018  After extensive coaching inhaler device,  effectiveness =  75% (short ti)    - FENO 08/26/2018  =  13 p symb 160 > changed to 80 2bid due to hoarseness/throat clearing    Asthma component appears to have been eliminated on the symb 160 but suspect it may be causing her uacs to flare so re try the lower dose/ work on better hfa/ add spacer next of if not better    I had an extended discussion with the patient reviewing all relevant studies completed to date and  lasting 15 to 20 minutes of a 25 minute visit    See device teaching which extended face to face time for this visit.  Each maintenance medication was reviewed in detail including emphasizing most importantly the difference between maintenance and prns and under what circumstances the prns are to be triggered using an action plan format that is not reflected in the computer generated alphabetically organized AVS which I have not found useful in most complex patients, especially with respiratory illnesses  Please see AVS for specific instructions unique to this visit that I personally wrote and verbalized to the the pt in detail and then reviewed with pt  by my nurse highlighting any  changes in therapy recommended at today's visit to their plan of care.

## 2018-08-27 NOTE — Assessment & Plan Note (Signed)
Body mass index is 58.98 kg/m.  -  trending no change No results found for: TSH   Contributing to gerd risk/ doe/reviewed the need and the process to achieve and maintain neg calorie balance > defer f/u primary care including intermittently monitoring thyroid status

## 2018-09-14 DIAGNOSIS — K148 Other diseases of tongue: Secondary | ICD-10-CM | POA: Insufficient documentation

## 2018-09-14 DIAGNOSIS — R220 Localized swelling, mass and lump, head: Secondary | ICD-10-CM

## 2018-09-22 ENCOUNTER — Other Ambulatory Visit: Payer: Self-pay | Admitting: Otolaryngology

## 2018-09-24 NOTE — Pre-Procedure Instructions (Signed)
Kelly Thornton  09/24/2018      [facility] [address], [place] [address] [place] [address] Phone: [phone] Fax: [phone]  [facility] [place], [place] - [place] [address] [place] [address] Phone: [phone] Fax: [phone]    Your procedure is scheduled on October 02, 2018.  Report to [facility] Admitting at 700 AM.  Call this number if you have problems the morning of surgery:  [phone]   Remember:  Do not eat or drink after midnight.    Take these medicines the morning of surgery with A SIP OF WATER  Effexor-XR Saline nasal spray Elmiron (pentosan polysulfate) Levothyroxine (synthroid) ceitirizine (zyrtec) Eye drops-if needed Bupropion (wellbutrin) Albuterol inhaler/nebulizer-if needed, please bring inhaler with you Symbicort inhaler   7 days prior to surgery STOP taking any Aspirin (unless otherwise instructed by your surgeon), Aleve, Naproxen, Ibuprofen, Motrin, Advil, Goody's, BC's, all herbal medications, fish oil, and all vitamins  WHAT DO I DO ABOUT MY DIABETES MEDICATION?  [name] Do not take oral diabetes medicines (pills) the morning of surgery-metformin (glucophage).   Reviewed and Endorsed by [facility] Patient Education Committee, August 2015  How to Manage Your Diabetes Before and After Surgery  Why is it important to control my blood sugar before and after surgery? . Improving blood sugar levels before and after surgery helps healing and can limit problems. . A way of improving blood sugar control is eating a healthy diet by: o  Eating less sugar and carbohydrates o  Increasing activity/exercise o  Talking with your doctor about reaching your blood sugar goals . High blood sugars (greater than 180 mg/dL) can raise your risk of infections and slow your recovery, so you will need to focus on controlling your  diabetes during the weeks before surgery. . Make sure that the doctor who takes care of your diabetes knows about your planned surgery including the date and location.  How do I manage my blood sugar before surgery? . Check your blood sugar at least 4 times a day, starting 2 days before surgery, to make sure that the level is not too high or low. o Check your blood sugar the morning of your surgery when you wake up and every 2 hours until you get to the Short Stay unit. . If your blood sugar is less than 70 mg/dL, you will need to treat for low blood sugar: o Do not take insulin. o Treat a low blood sugar (less than 70 mg/dL) with  cup of clear juice (cranberry or apple), 4 glucose tablets, OR glucose gel. Recheck blood sugar in 15 minutes after treatment (to make sure it is greater than 70 mg/dL). If your blood sugar is not greater than 70 mg/dL on recheck, call [phone] o  for further instructions. . Report your blood sugar to the short stay nurse when you get to Short Stay.  . If you are admitted to the hospital after surgery: o Your blood sugar will be checked by the staff and you will probably be given insulin after surgery (instead of oral diabetes medicines) to make sure you have good blood sugar levels. o The goal for blood sugar control after surgery is 80-180 mg/dL.   [place]- Preparing For Surgery  Before surgery, you can play an important role. Because skin is not sterile, your skin needs to be  as free of germs as possible. You can reduce the number of germs on your skin by washing with CHG (chlorahexidine gluconate) Soap before surgery.  CHG is an antiseptic cleaner which kills germs and bonds with the skin to continue killing germs even after washing.    Oral Hygiene is also important to reduce your risk of infection.  Remember - BRUSH YOUR TEETH THE MORNING OF SURGERY WITH YOUR REGULAR TOOTHPASTE  Please do not use if you have an allergy to CHG or antibacterial soaps.  If your skin becomes reddened/irritated stop using the CHG.  Do not shave (including legs and underarms) for at least 48 hours prior to first CHG shower. It is OK to shave your face.  Please follow these instructions carefully.   1. Shower the NIGHT BEFORE SURGERY and the MORNING OF SURGERY with CHG.   2. If you chose to wash your hair, wash your hair first as usual with your normal shampoo.  3. After you shampoo, rinse your hair and body thoroughly to remove the shampoo.  4. Use CHG as you would any other liquid soap. You can apply CHG directly to the skin and wash gently with a scrungie or a clean washcloth.   5. Apply the CHG Soap to your body ONLY FROM THE NECK DOWN.  Do not use on open wounds or open sores. Avoid contact with your eyes, ears, mouth and genitals (private parts). Wash Face and genitals (private parts)  with your normal soap.  6. Wash thoroughly, paying special attention to the area where your surgery will be performed.  7. Thoroughly rinse your body with warm water from the neck down.  8. DO NOT shower/wash with your normal soap after using and rinsing off the CHG Soap.  9. Pat yourself dry with a CLEAN TOWEL.  10. Wear CLEAN PAJAMAS to bed the night before surgery, wear comfortable clothes the morning of surgery  11. Place CLEAN SHEETS on your bed the night of your first shower and DO NOT SLEEP WITH PETS.  Day of Surgery:  Do not apply any deodorants/lotions.  Please wear clean clothes to the hospital/surgery center.   Remember to brush your teeth WITH YOUR REGULAR TOOTHPASTE.   Do not wear jewelry, make-up or nail polish.  Do not wear lotions, powders, or perfumes, or deodorant.  Do not shave 48 hours prior to surgery.    Do not bring valuables to the hospital.   [facility] is not responsible for any belongings or valuables.  Contacts, dentures or bridgework may not be worn into surgery.  Leave your suitcase in the car.  After surgery it may be brought  to your room.  For patients admitted to the hospital, discharge time will be determined by your treatment team.  Patients discharged the day of surgery will not be allowed to drive home.   Please read over the following fact sheets that you were given.

## 2018-09-25 ENCOUNTER — Encounter (HOSPITAL_COMMUNITY): Payer: Self-pay | Admitting: *Deleted

## 2018-09-25 ENCOUNTER — Encounter (HOSPITAL_COMMUNITY)
Admission: RE | Admit: 2018-09-25 | Discharge: 2018-09-25 | Disposition: A | Discharge status: Home / Self Care | Payer: PRIVATE HEALTH INSURANCE | Source: Ambulatory Visit | Attending: Otolaryngology | Admitting: Otolaryngology

## 2018-09-25 DIAGNOSIS — Z6841 Body Mass Index (BMI) 40.0 and over, adult: Secondary | ICD-10-CM | POA: Insufficient documentation

## 2018-09-25 DIAGNOSIS — E119 Type 2 diabetes mellitus without complications: Secondary | ICD-10-CM | POA: Insufficient documentation

## 2018-09-25 DIAGNOSIS — Z01818 Encounter for other preprocedural examination: Secondary | ICD-10-CM | POA: Insufficient documentation

## 2018-09-25 DIAGNOSIS — Z79890 Hormone replacement therapy (postmenopausal): Secondary | ICD-10-CM | POA: Diagnosis not present

## 2018-09-25 DIAGNOSIS — E785 Hyperlipidemia, unspecified: Secondary | ICD-10-CM | POA: Diagnosis not present

## 2018-09-25 DIAGNOSIS — Z7951 Long term (current) use of inhaled steroids: Secondary | ICD-10-CM | POA: Diagnosis not present

## 2018-09-25 DIAGNOSIS — F419 Anxiety disorder, unspecified: Secondary | ICD-10-CM | POA: Diagnosis not present

## 2018-09-25 DIAGNOSIS — K1370 Unspecified lesions of oral mucosa: Secondary | ICD-10-CM | POA: Diagnosis not present

## 2018-09-25 DIAGNOSIS — Z79899 Other long term (current) drug therapy: Secondary | ICD-10-CM | POA: Diagnosis not present

## 2018-09-25 DIAGNOSIS — R000 Tachycardia, unspecified: Secondary | ICD-10-CM | POA: Diagnosis not present

## 2018-09-25 DIAGNOSIS — I10 Essential (primary) hypertension: Secondary | ICD-10-CM | POA: Insufficient documentation

## 2018-09-25 DIAGNOSIS — M797 Fibromyalgia: Secondary | ICD-10-CM | POA: Insufficient documentation

## 2018-09-25 HISTORY — DX: Fibromyalgia: M79.7

## 2018-09-25 HISTORY — DX: Dyspnea, unspecified: R06.00

## 2018-09-25 HISTORY — DX: Other specified postprocedural states: Z98.890

## 2018-09-25 HISTORY — DX: Sjogren syndrome, unspecified: M35.00

## 2018-09-25 HISTORY — DX: Nausea with vomiting, unspecified: R11.2

## 2018-09-25 HISTORY — DX: Type 2 diabetes mellitus without complications: E11.9

## 2018-09-25 LAB — CBC
HEMATOCRIT: 42 % (ref 36.0–46.0)
Hemoglobin: 13.3 g/dL (ref 12.0–15.0)
MCH: 31.5 pg (ref 26.0–34.0)
MCHC: 31.7 g/dL (ref 30.0–36.0)
MCV: 99.5 fL (ref 78.0–100.0)
PLATELETS: 231 10*3/uL (ref 150–400)
RBC: 4.22 MIL/uL (ref 3.87–5.11)
RDW: 13.2 % (ref 11.5–15.5)
WBC: 6.1 10*3/uL (ref 4.0–10.5)

## 2018-09-25 LAB — BASIC METABOLIC PANEL
Anion gap: 11 (ref 5–15)
BUN: 20 mg/dL (ref 6–20)
CO2: 23 mmol/L (ref 22–32)
CREATININE: 0.95 mg/dL (ref 0.44–1.00)
Calcium: 9.4 mg/dL (ref 8.9–10.3)
Chloride: 104 mmol/L (ref 98–111)
GFR calc Af Amer: >60 mL/min (ref >60)
GLUCOSE: 115 mg/dL — AB (ref 70–99)
Potassium: 3.6 mmol/L (ref 3.5–5.1)
Sodium: 138 mmol/L (ref 135–145)

## 2018-09-25 LAB — GLUCOSE, CAPILLARY: Glucose-Capillary: 120 mg/dL — ABNORMAL HIGH (ref 70–99)

## 2018-09-25 MED ORDER — CHLORHEXIDINE GLUCONATE CLOTH 2 % EX PADS: 6.0000 | MEDICATED_PAD | Freq: Once | CUTANEOUS | Status: DC

## 2018-09-25 NOTE — Progress Notes (Signed)
Anesthesia Chart Review:  Case:  [id number] Date/Time:  10/02/18 1015   Procedure:  EXCISION OF TONGUE LESION (Left )   Anesthesia type:  General   Pre-op diagnosis:  left tongue lesion   Location:  [facility] OR ROOM 12 / [place] [facility]   Surgeon:  [name], MD      DISCUSSION: 57 yo female never smoker. Pertinent hx includes PONV, HTN, Cough variant asthma, Morbid obesity BMI 60, Anxiety, Sjogren's, DMII.  PAT EKG notable for frequent PVCs in singlets, couplets, and two runs of 3 consecutive. Pt asymptomatic. Says sometimes she feels palpitations but never associated with SOB, CP, syncope. She denies any cardiac history. She does say her baseline HR is 90-100. She is not on a Bblocker. She does have cough variant asthma and follows with Dr. Melvyn Novas. She had an Echo 12/2017 to eval SOB showing mild LVH, nl systolic function, EF 85-46%.  EKG tracings faxed to Dr. [name] for review. Discussed case with Dr. Glennon Mac. She advised that given pt's normal echo and the fact that she is asymptomatic it is okay to proceed with surgery as planned.  VS: BP 138/74   Pulse 77   Temp 36.9 C   Resp 20   Ht 5\' 6"  (1.676 m)   Wt (!) 169.1 kg   SpO2 99%   BMI 60.19 kg/m   PROVIDERS: Koirala, Dibas, MD is PCP  Christinia Gully, MD is pulmonologist  LABS: Labs reviewed: Acceptable for surgery. (all labs ordered are listed, but only abnormal results are displayed)  Labs Reviewed  GLUCOSE, CAPILLARY - Abnormal; Notable for the following components:      Result Value   Glucose-Capillary 120 (*)    All other components within normal limits  HEMOGLOBIN A1C - Abnormal; Notable for the following components:   Hgb A1c MFr Bld 6.0 (*)    All other components within normal limits  BASIC METABOLIC PANEL - Abnormal; Notable for the following components:   Glucose, Bld 115 (*)    All other components within normal limits  CBC     IMAGES: CHEST - 2 VIEW 04/02/2018  COMPARISON:  06/20/2015  FINDINGS: Enlargement  of cardiac silhouette.  Mediastinal contours and pulmonary vascularity normal.  Eventration of the anterior RIGHT diaphragm again seen.  No acute infiltrate, pleural effusion or pneumothorax.  Scattered endplate spur formation thoracic spine.  IMPRESSION: Enlargement of cardiac silhouette.  No acute abnormalities.   EKG: 09/25/2018: Sinus tachycardia with frequent and consecutive Premature ventricular complexes. Brief NSVT. Low voltage QRS. Inferior infarct , age undetermined  CV: TTE 01/16/2018: Study Conclusions  - Left ventricle: The cavity size was normal. Wall thickness was   increased in a pattern of mild LVH. Systolic function was normal.   The estimated ejection fraction was in the range of 55% to 60%.   The study is not technically sufficient to allow evaluation of LV   diastolic function.  Pulm: - PFT's  05/19/2018  FEV1 2.60 (90 % ) ratio 91  p 17 % improvement from saba p nothing prior to study(since symb 80 x 2 x  12h) with DLCO  81 % corrects to 122  % for alv volume     Past Medical History:  Diagnosis Date  . Anxiety   . Arthritis   . Asthma   . Cough variant asthma 04/02/2018   FENO 04/02/2018  =   11 on advair 250 one bid - Spirometry 04/02/2018  FEV1 2.39 (84%)  Ratio 87 with truncation of  peak flow p advair prior  - 04/02/2018  After extensive coaching inhaler device  effectiveness =    75% from a baseline of 50% so try symbicort 80  1-2 bid    . Depression   . Diabetes mellitus without complication ([place])   . Dyspnea   . Endometriosis   . Fibromyalgia   . Hyperlipidemia   . Hypertension   . Irritable bowel syndrome (IBS)   . Morbid obesity ([place])   . PONV (postoperative nausea and vomiting)   . Sjogren's disease ([place])   . Thyroid disease     Past Surgical History:  Procedure Laterality Date  . bladder stretching    . DIAGNOSTIC LAPAROSCOPY     x4  . EYE SURGERY    . NASAL SEPTUM SURGERY    . TONSILLECTOMY      MEDICATIONS: .  albuterol (ACCUNEB) 0.63 MG/3ML nebulizer solution  . albuterol (PROVENTIL HFA;VENTOLIN HFA) 108 (90 BASE) MCG/ACT inhaler  . amitriptyline (ELAVIL) 100 MG tablet  . budesonide-formoterol (SYMBICORT) 160-4.5 MCG/ACT inhaler  . buPROPion (WELLBUTRIN XL) 150 MG 24 hr tablet  . carboxymethylcellulose (REFRESH TEARS) 0.5 % SOLN  . cetirizine (ZYRTEC) 10 MG tablet  . Cholecalciferol (VITAMIN D) 2000 units tablet  . EPINEPHrine 0.3 mg/0.3 mL IJ SOAJ injection  . Eyelid Cleansers (AVENOVA) 0.01 % SOLN  . fluticasone (FLONASE) 50 MCG/ACT nasal spray  . irbesartan (AVAPRO) 150 MG tablet  . levothyroxine (SYNTHROID, LEVOTHROID) 200 MCG tablet  . metFORMIN (GLUCOPHAGE) 500 MG tablet  . Multiple Vitamin (MULTIVITAMIN) capsule  . NASAL SALINE NA  . pentosan polysulfate (ELMIRON) 100 MG capsule  . rosuvastatin (CRESTOR) 5 MG tablet  . triamterene-hydrochlorothiazide (DYAZIDE) 37.5-25 MG capsule  . venlafaxine XR (EFFEXOR-XR) 150 MG 24 hr capsule   No current facility-administered medications for this encounter.     Wynonia Musty [facility]/Anesthesiology Phone [phone] 09/28/2018 10:41 AM

## 2018-09-26 LAB — HEMOGLOBIN A1C
Hgb A1c MFr Bld: 6 % — ABNORMAL HIGH (ref 4.8–5.6)
MEAN PLASMA GLUCOSE: 126 mg/dL

## 2018-10-02 ENCOUNTER — Encounter (HOSPITAL_COMMUNITY): Payer: Self-pay | Admitting: Certified Registered Nurse Anesthetist

## 2018-10-02 ENCOUNTER — Ambulatory Visit (HOSPITAL_COMMUNITY): Payer: No Typology Code available for payment source | Admitting: Physician Assistant

## 2018-10-02 ENCOUNTER — Ambulatory Visit (HOSPITAL_COMMUNITY)
Admission: RE | Admit: 2018-10-02 | Discharge: 2018-10-02 | Disposition: A | Discharge status: Home / Self Care | Payer: No Typology Code available for payment source | Source: Ambulatory Visit | Attending: Otolaryngology | Admitting: Otolaryngology

## 2018-10-02 ENCOUNTER — Other Ambulatory Visit: Payer: Self-pay

## 2018-10-02 ENCOUNTER — Encounter (HOSPITAL_COMMUNITY)
Admission: RE | Disposition: A | Discharge status: Home / Self Care | Payer: Self-pay | Source: Ambulatory Visit | Attending: Otolaryngology

## 2018-10-02 ENCOUNTER — Ambulatory Visit (HOSPITAL_COMMUNITY): Payer: No Typology Code available for payment source | Admitting: Certified Registered Nurse Anesthetist

## 2018-10-02 DIAGNOSIS — Z91013 Allergy to seafood: Secondary | ICD-10-CM | POA: Diagnosis not present

## 2018-10-02 DIAGNOSIS — I10 Essential (primary) hypertension: Secondary | ICD-10-CM | POA: Diagnosis not present

## 2018-10-02 DIAGNOSIS — F419 Anxiety disorder, unspecified: Secondary | ICD-10-CM | POA: Diagnosis not present

## 2018-10-02 DIAGNOSIS — M3500 Sicca syndrome, unspecified: Secondary | ICD-10-CM | POA: Insufficient documentation

## 2018-10-02 DIAGNOSIS — Z880 Allergy status to penicillin: Secondary | ICD-10-CM | POA: Diagnosis not present

## 2018-10-02 DIAGNOSIS — M1990 Unspecified osteoarthritis, unspecified site: Secondary | ICD-10-CM | POA: Insufficient documentation

## 2018-10-02 DIAGNOSIS — Z8249 Family history of ischemic heart disease and other diseases of the circulatory system: Secondary | ICD-10-CM | POA: Diagnosis not present

## 2018-10-02 DIAGNOSIS — E119 Type 2 diabetes mellitus without complications: Secondary | ICD-10-CM | POA: Insufficient documentation

## 2018-10-02 DIAGNOSIS — Z7984 Long term (current) use of oral hypoglycemic drugs: Secondary | ICD-10-CM | POA: Insufficient documentation

## 2018-10-02 DIAGNOSIS — Z881 Allergy status to other antibiotic agents status: Secondary | ICD-10-CM | POA: Diagnosis not present

## 2018-10-02 DIAGNOSIS — J45991 Cough variant asthma: Secondary | ICD-10-CM | POA: Diagnosis not present

## 2018-10-02 DIAGNOSIS — Z888 Allergy status to other drugs, medicaments and biological substances status: Secondary | ICD-10-CM | POA: Diagnosis not present

## 2018-10-02 DIAGNOSIS — Z885 Allergy status to narcotic agent status: Secondary | ICD-10-CM | POA: Diagnosis not present

## 2018-10-02 DIAGNOSIS — Z79899 Other long term (current) drug therapy: Secondary | ICD-10-CM | POA: Diagnosis not present

## 2018-10-02 DIAGNOSIS — M797 Fibromyalgia: Secondary | ICD-10-CM | POA: Diagnosis not present

## 2018-10-02 DIAGNOSIS — F329 Major depressive disorder, single episode, unspecified: Secondary | ICD-10-CM | POA: Insufficient documentation

## 2018-10-02 DIAGNOSIS — K589 Irritable bowel syndrome without diarrhea: Secondary | ICD-10-CM | POA: Diagnosis not present

## 2018-10-02 DIAGNOSIS — D101 Benign neoplasm of tongue: Secondary | ICD-10-CM | POA: Diagnosis not present

## 2018-10-02 DIAGNOSIS — K149 Disease of tongue, unspecified: Secondary | ICD-10-CM | POA: Diagnosis present

## 2018-10-02 DIAGNOSIS — E785 Hyperlipidemia, unspecified: Secondary | ICD-10-CM | POA: Insufficient documentation

## 2018-10-02 DIAGNOSIS — Z6841 Body Mass Index (BMI) 40.0 and over, adult: Secondary | ICD-10-CM | POA: Diagnosis not present

## 2018-10-02 HISTORY — PX: EXCISION OF TONGUE LESION: SHX6434

## 2018-10-02 LAB — GLUCOSE, CAPILLARY
GLUCOSE-CAPILLARY: 122 mg/dL — AB (ref 70–99)
GLUCOSE-CAPILLARY: 129 mg/dL — AB (ref 70–99)
Glucose-Capillary: 135 mg/dL — ABNORMAL HIGH (ref 70–99)

## 2018-10-02 SURGERY — EXCISION, LESION, TONGUE
Anesthesia: General | Laterality: Left

## 2018-10-02 MED ORDER — DEXAMETHASONE SODIUM PHOSPHATE 10 MG/ML IJ SOLN
INTRAMUSCULAR | Status: DC | PRN
  Administered 2018-10-02: 10 mg via INTRAVENOUS

## 2018-10-02 MED ORDER — LIDOCAINE-EPINEPHRINE 1 %-1:100000 IJ SOLN
INTRAMUSCULAR | Status: AC
  Filled 2018-10-02: qty 1

## 2018-10-02 MED ORDER — PROPOFOL 10 MG/ML IV BOLUS
INTRAVENOUS | Status: DC | PRN
  Administered 2018-10-02 (×2): 50 mg via INTRAVENOUS
  Administered 2018-10-02: 150 mg via INTRAVENOUS

## 2018-10-02 MED ORDER — PROMETHAZINE HCL 25 MG/ML IJ SOLN: 6.2500 mg | INTRAMUSCULAR | Status: DC | PRN

## 2018-10-02 MED ORDER — MEPERIDINE HCL 50 MG/ML IJ SOLN: 6.2500 mg | INTRAMUSCULAR | Status: DC | PRN

## 2018-10-02 MED ORDER — FENTANYL CITRATE (PF) 250 MCG/5ML IJ SOLN
INTRAMUSCULAR | Status: AC
  Filled 2018-10-02: qty 5

## 2018-10-02 MED ORDER — ONDANSETRON HCL 4 MG/2ML IJ SOLN
INTRAMUSCULAR | Status: DC | PRN
  Administered 2018-10-02: 4 mg via INTRAVENOUS

## 2018-10-02 MED ORDER — 0.9 % SODIUM CHLORIDE (POUR BTL) OPTIME
TOPICAL | Status: DC | PRN
  Administered 2018-10-02: 1000 mL

## 2018-10-02 MED ORDER — OXYCODONE HCL 5 MG PO TABS: 5.0000 mg | ORAL_TABLET | Freq: Once | ORAL | Status: DC | PRN

## 2018-10-02 MED ORDER — MIDAZOLAM HCL 2 MG/2ML IJ SOLN
INTRAMUSCULAR | Status: AC
  Filled 2018-10-02: qty 2

## 2018-10-02 MED ORDER — FENTANYL CITRATE (PF) 100 MCG/2ML IJ SOLN
INTRAMUSCULAR | Status: DC | PRN
  Administered 2018-10-02: 25 ug via INTRAVENOUS
  Administered 2018-10-02 (×2): 50 ug via INTRAVENOUS

## 2018-10-02 MED ORDER — SCOPOLAMINE 1 MG/3DAYS TD PT72
MEDICATED_PATCH | TRANSDERMAL | Status: DC | PRN
  Administered 2018-10-02: 1 via TRANSDERMAL

## 2018-10-02 MED ORDER — MIDAZOLAM HCL 5 MG/5ML IJ SOLN
INTRAMUSCULAR | Status: DC | PRN
  Administered 2018-10-02 (×2): 1 mg via INTRAVENOUS

## 2018-10-02 MED ORDER — LIDOCAINE-EPINEPHRINE 1 %-1:100000 IJ SOLN
INTRAMUSCULAR | Status: DC | PRN
  Administered 2018-10-02: 20 mL

## 2018-10-02 MED ORDER — LIDOCAINE 2% (20 MG/ML) 5 ML SYRINGE
INTRAMUSCULAR | Status: DC | PRN
  Administered 2018-10-02: 80 mg via INTRAVENOUS

## 2018-10-02 MED ORDER — SUCCINYLCHOLINE CHLORIDE 200 MG/10ML IV SOSY
PREFILLED_SYRINGE | INTRAVENOUS | Status: DC | PRN
  Administered 2018-10-02: 120 mg via INTRAVENOUS

## 2018-10-02 MED ORDER — CIPROFLOXACIN IN D5W 400 MG/200ML IV SOLN
INTRAVENOUS | Status: AC
  Filled 2018-10-02: qty 200

## 2018-10-02 MED ORDER — OXYCODONE HCL 5 MG/5ML PO SOLN: 5.0000 mg | Freq: Once | ORAL | Status: DC | PRN

## 2018-10-02 MED ORDER — HYDROMORPHONE HCL 1 MG/ML IJ SOLN: 0.2500 mg | INTRAMUSCULAR | Status: DC | PRN

## 2018-10-02 MED ORDER — CIPROFLOXACIN IN D5W 400 MG/200ML IV SOLN
400.0000 mg | Freq: Once | INTRAVENOUS | Status: AC
  Administered 2018-10-02: 400 mg via INTRAVENOUS

## 2018-10-02 MED ORDER — LACTATED RINGERS IV SOLN
INTRAVENOUS | Status: DC
  Administered 2018-10-02: 09:00:00 via INTRAVENOUS

## 2018-10-02 SURGICAL SUPPLY — 51 items
ATTRACTOMAT 16X20 MAGNETIC DRP (DRAPES) IMPLANT
BAG DECANTER FOR FLEXI CONT (MISCELLANEOUS) ×3 IMPLANT
BLADE CLIPPER SURG (BLADE) ×3 IMPLANT
BLADE SURG 15 STRL LF DISP TIS (BLADE) IMPLANT
BLADE SURG 15 STRL SS (BLADE)
BNDG GAUZE ELAST 4 BULKY (GAUZE/BANDAGES/DRESSINGS) ×3 IMPLANT
CANISTER SUCT 3000ML PPV (MISCELLANEOUS) ×3 IMPLANT
CLEANER TIP ELECTROSURG 2X2 (MISCELLANEOUS) ×3 IMPLANT
CONT SPEC 4OZ CLIKSEAL STRL BL (MISCELLANEOUS) ×3 IMPLANT
COVER SURGICAL LIGHT HANDLE (MISCELLANEOUS) ×3 IMPLANT
COVER WAND RF STERILE (DRAPES) ×3 IMPLANT
DRAIN CHANNEL 10F 3/8 F FF (DRAIN) IMPLANT
DRAIN SNY 7 FPER (WOUND CARE) IMPLANT
DRAPE HALF SHEET 40X57 (DRAPES) IMPLANT
ELECT COATED BLADE 2.86 ST (ELECTRODE) ×3 IMPLANT
ELECT PAIRED SUBDERMAL (MISCELLANEOUS)
ELECT REM PT RETURN 9FT ADLT (ELECTROSURGICAL) ×3
ELECTRODE PAIRED SUBDERMAL (MISCELLANEOUS) IMPLANT
ELECTRODE REM PT RTRN 9FT ADLT (ELECTROSURGICAL) ×1 IMPLANT
EVACUATOR SILICONE 100CC (DRAIN) IMPLANT
GAUZE 4X4 16PLY RFD (DISPOSABLE) ×3 IMPLANT
GLOVE BIOGEL M 7.0 STRL (GLOVE) ×3 IMPLANT
GOWN STRL REUS W/ TWL LRG LVL3 (GOWN DISPOSABLE) ×2 IMPLANT
GOWN STRL REUS W/TWL LRG LVL3 (GOWN DISPOSABLE) ×6
KIT BASIN OR (CUSTOM PROCEDURE TRAY) ×3 IMPLANT
KIT TURNOVER KIT B (KITS) ×3 IMPLANT
LOCATOR NERVE 3 VOLT (DISPOSABLE) ×3 IMPLANT
NEEDLE HYPO 25GX1X1/2 BEV (NEEDLE) IMPLANT
NS IRRIG 1000ML POUR BTL (IV SOLUTION) ×3 IMPLANT
PAD ARMBOARD 7.5X6 YLW CONV (MISCELLANEOUS) ×6 IMPLANT
PENCIL BUTTON HOLSTER BLD 10FT (ELECTRODE) ×3 IMPLANT
ROLLS DENTAL (MISCELLANEOUS) IMPLANT
SPONGE INTESTINAL PEANUT (DISPOSABLE) IMPLANT
SPONGE LAP 18X18 X RAY DECT (DISPOSABLE) ×3 IMPLANT
STAPLER VISISTAT 35W (STAPLE) ×3 IMPLANT
SUT CHROMIC 3 0 SH 27 (SUTURE) ×3 IMPLANT
SUT SILK 2 0 PERMA HAND 18 BK (SUTURE) IMPLANT
SUT SILK 2 0 REEL (SUTURE) ×3 IMPLANT
SUT SILK 3 0 REEL (SUTURE) ×3 IMPLANT
SUT VIC AB 3-0 FS2 27 (SUTURE) IMPLANT
SUT VIC AB 4-0 SH 27 (SUTURE)
SUT VIC AB 4-0 SH 27XBRD (SUTURE) IMPLANT
SYR 5ML LL (SYRINGE) IMPLANT
SYR BULB 3OZ (MISCELLANEOUS) ×3 IMPLANT
TOWEL OR 17X24 6PK STRL BLUE (TOWEL DISPOSABLE) ×3 IMPLANT
TRAY ENT MC OR (CUSTOM PROCEDURE TRAY) ×3 IMPLANT
TRAY FOLEY MTR SLVR 14FR STAT (SET/KITS/TRAYS/PACK) IMPLANT
TUBE CONNECTING 12'X1/4 (SUCTIONS) ×1
TUBE CONNECTING 12X1/4 (SUCTIONS) ×2 IMPLANT
UNDERPAD 30X30 (UNDERPADS AND DIAPERS) IMPLANT
WATER STERILE IRR 1000ML POUR (IV SOLUTION) ×3 IMPLANT

## 2018-10-02 NOTE — H&P (Signed)
Kelly Thornton is an 57 y.o. female.   Chief Complaint: Left tongue mass HPI: Left lateral tongue mass  Past Medical History:  Diagnosis Date  . Anxiety   . Arthritis   . Asthma   . Cough variant asthma 04/02/2018   FENO 04/02/2018  =   11 on advair 250 one bid - Spirometry 04/02/2018  FEV1 2.39 (84%)  Ratio 87 with truncation of peak flow p advair prior  - 04/02/2018  After extensive coaching inhaler device  effectiveness =    75% from a baseline of 50% so try symbicort 80  1-2 bid    . Depression   . Diabetes mellitus without complication ([place])   . Dyspnea   . Endometriosis   . Fibromyalgia   . Hyperlipidemia   . Hypertension   . Irritable bowel syndrome (IBS)   . Morbid obesity ([place])   . PONV (postoperative nausea and vomiting)   . Sjogren's disease ([place])   . Thyroid disease     Past Surgical History:  Procedure Laterality Date  . bladder stretching    . DIAGNOSTIC LAPAROSCOPY     x4  . EYE SURGERY    . NASAL SEPTUM SURGERY    . TONSILLECTOMY      Family History  Problem Relation Age of Onset  . Cancer Other   . Hyperlipidemia Other   . Hypertension Other   . Heart disease Other   . Sleep apnea Other    Social History:  reports that she has never smoked. She has never used smokeless tobacco. She reports that she drank alcohol. She reports that she does not use drugs.  Allergies:  Allergies  Allergen Reactions  . Isopropyl Alcohol Rash    ? GASTRITIS ? GROUPED WITH OTHERS > ? ANAPHYLAXIS ?  [name] Mineral Oil Nausea And Vomiting    GASTRITIS GROUPED WITH OTHERS > ? ANAPHYLAXIS ?  [name] Monosodium Glutamate Nausea And Vomiting    GASTRITIS GROUPED WITH OTHERS > ? ANAPHYLAXIS ?  . Nickel Rash    GROUPED WITH OTHERS > ? ANAPHYLAXIS ?  . Other Anaphylaxis    Other reaction(s): Other (See Comments) gastritis redness PNEUMO VAX, RUBBING ALCOHOL, MINERAL OIL, NICKEL, MSG, SILICON- REACTION   . Pneumovax [Pneumococcal Polysaccharide Vaccine] Rash    GROUPED WITH OTHERS >  ? ANAPHYLAXIS ?  [name] Rubbing Alcohol [Alcohol] Other (See Comments)    "burns skin" GROUPED WITH OTHERS > ? ANAPHYLAXIS ?  [name] Shellfish Allergy Anaphylaxis  . Silicone Dioxide [Silica] Other (See Comments)    UNSPECIFIED REACTION  GROUPED WITH OTHERS > ? ANAPHYLAXIS ?  [name] Ace Inhibitors Swelling  . Adhesive [Tape]     redness  . Avelox [Moxifloxacin Hcl In Nacl]     gastritis  . Chocolate     sneezing  . Cocoa     Other reaction(s): Other (See Comments) sneezing  . Codeine     GI upset  . Darvon [Propoxyphene]     GI Upset  . Fluorescein-Benoxinate     Other reaction(s): Other (See Comments) Eye irritation  . Hydrocodone-Acetaminophen     Other reaction(s): GI Upset (intolerance)  . Lisinopril Swelling  . Povidone-Iodine     Other reaction(s): Other (See Comments) Rash   . Procaine     Other reaction(s): Other (See Comments) UNKNOWN  . Simvastatin     Leg cramps  . Tetracyclines & Related     GI upset  . Bextra [Valdecoxib] Rash  . Biaxin [Clarithromycin] Rash  .  Ceclor [Cefaclor] Rash  . Doxycycline Rash  . Erythromycin Rash  . Hibiclens [Chlorhexidine Gluconate] Rash  . Keflex [Cephalexin] Rash  . Melatonin Rash  . Penicillins Rash  . Thimerosal Rash    Medications Prior to Admission  Medication Sig Dispense Refill  . albuterol (PROVENTIL HFA;VENTOLIN HFA) 108 (90 BASE) MCG/ACT inhaler Inhale 2 puffs into the lungs every 6 (six) hours as needed for wheezing or shortness of breath.    [name] amitriptyline (ELAVIL) 100 MG tablet Take 100 mg by mouth at bedtime.    . budesonide-formoterol (SYMBICORT) 160-4.5 MCG/ACT inhaler Inhale 2 puffs into the lungs 2 (two) times daily.    [name] buPROPion (WELLBUTRIN XL) 150 MG 24 hr tablet Take 150 mg by mouth daily.    . carboxymethylcellulose (REFRESH TEARS) 0.5 % SOLN Place 1 drop into both eyes 3 (three) times daily as needed.    . cetirizine (ZYRTEC) 10 MG tablet Take 10 mg by mouth daily.    . Cholecalciferol (VITAMIN D) 2000  units tablet Take 2,000 Units by mouth daily.     [name] EPINEPHrine 0.3 mg/0.3 mL IJ SOAJ injection Inject 0.3 mg into the muscle as needed (seafood).     . Eyelid Cleansers (AVENOVA) 0.01 % SOLN Place 1 application into both eyes 2 (two) times daily.    . fluticasone (FLONASE) 50 MCG/ACT nasal spray Place 2 sprays into both nostrils at bedtime.    . irbesartan (AVAPRO) 150 MG tablet Take 150 mg by mouth daily. Unsure of strength     . levothyroxine (SYNTHROID, LEVOTHROID) 200 MCG tablet Take 200 mcg by mouth daily before breakfast.     . metFORMIN (GLUCOPHAGE) 500 MG tablet Take 500 mg by mouth daily.     . Multiple Vitamin (MULTIVITAMIN) capsule Take 1 capsule by mouth daily.    [name] NASAL SALINE NA Place 2 sprays into the nose every morning.     . pentosan polysulfate (ELMIRON) 100 MG capsule Take 200 mg by mouth 2 (two) times daily.    . rosuvastatin (CRESTOR) 5 MG tablet Take 5 mg by mouth daily.    [name] triamterene-hydrochlorothiazide (DYAZIDE) 37.5-25 MG capsule Take 1 capsule by mouth daily.    [name] venlafaxine XR (EFFEXOR-XR) 150 MG 24 hr capsule Take 150 mg by mouth daily with breakfast.    . albuterol (ACCUNEB) 0.63 MG/3ML nebulizer solution Take 1 ampule by nebulization 3 (three) times daily as needed for wheezing.      Results for orders placed or performed during the hospital encounter of 10/02/18 (from the past 48 hour(s))  Glucose, capillary     Status: Abnormal   Collection Time: 10/02/18  8:21 AM  Result Value Ref Range   Glucose-Capillary 135 (H) 70 - 99 mg/dL   No results found.  Review of Systems  Constitutional: Negative.   HENT: Negative.     Blood pressure (!) 166/64, pulse (!) 106, temperature 98.2 F (36.8 C), temperature source Oral, resp. rate 20, height 5\' 6"  (1.676 m), weight (!) 169.1 kg, SpO2 99 %. Physical Exam  Constitutional: She appears well-developed.  HENT:  Left tongue mass  Neck: Normal range of motion. Neck supple.  Cardiovascular: Normal rate.   Respiratory: Effort normal.  GI: Soft.     Assessment/Plan Adm for OP excision left lateral tongue mass.  [name], MD 10/02/2018, 10:25 AM

## 2018-10-02 NOTE — Op Note (Signed)
Operative Note: EXCISION TONGUE LESION  Patient: Reading record number: [id number]  Date:10/02/2018  Pre-operative Indications: Left lateral tongue lesion  Postoperative Indications: Same  Surgical Procedure: Excision left Lateral lesion  Anesthesia: GET  Surgeon: Delsa Bern, M.D.  Assist: none  Complications: None  EBL:   Brief History: The patient is a 57 y.o. female with a history of left lateral tongue lesion tongue lesion. Patient reports a symptomatic finding on recent dental examination.  Patient is a non-smoker.  She was seen in the office with concerns regarding left lateral tongue lesion.  Given the patient's history and findings I recommended excision of the lesion under general anesthesia, risks and benefits were discussed in detail with the patient and their family. They understand and agree with our plan for surgery which is scheduled at [facility] on an elective basis.  Surgical Procedure: The patient is brought to the operating room on 10/02/2018 and placed in supine position on the operating table. General endotracheal anesthesia was established without difficulty. When the patient was adequately anesthetized, surgical timeout was performed and correct identification of the patient and the surgical procedure. The patient was positioned and prepped and draped in sterile fashion.  The patient's oral cavity and oropharynx were inspected.  There was a 1 cm nonulcerated raised lesion along the left lateral tongue.  Using Bovie electrocautery, margins of normal tissue around the lesion were demarcated.  The mass was then excised in an elliptical fashion with adequate mucosal and submucosal margins to the level of the deep tongue musculature.  The entire lesion was removed and sent to pathology for gross and microscopic evaluation.  Hemostasis maintained with Bovie electrocautery.  The excision site was then closed with 0 chromic suture on a  tapered needle in a horizontal mattressing fashion reapproximating the mucosa and underlying deep muscular layer of the tongue.  Patient's oral cavity and oropharynx were then irrigated and suctioned.  An orogastric tube was passed and stomach contents were aspirated. Patient was awakened from anesthetic and transferred from the operating room to the recovery room in stable condition. There were no complications and blood loss was minimal.   Delsa Bern, M.D. [facility] 10/02/2018

## 2018-10-02 NOTE — Anesthesia Procedure Notes (Signed)
Procedure Name: Intubation Date/Time: 10/02/2018 10:51 AM Performed by: Candis Shine, CRNA Pre-anesthesia Checklist: Patient identified, Emergency Drugs available, Suction available and Patient being monitored Patient Re-evaluated:Patient Re-evaluated prior to induction Oxygen Delivery Method: Circle System Utilized Preoxygenation: Pre-oxygenation with 100% oxygen Induction Type: IV induction Ventilation: Mask ventilation without difficulty Laryngoscope Size: Mac and 3 Grade View: Grade I Tube type: Oral Tube size: 7.0 mm Number of attempts: 1 Airway Equipment and Method: Stylet Placement Confirmation: ETT inserted through vocal cords under direct vision,  positive ETCO2 and breath sounds checked- equal and bilateral Secured at: 22 cm Tube secured with: Tape Dental Injury: Teeth and Oropharynx as per pre-operative assessment

## 2018-10-02 NOTE — Transfer of Care (Signed)
Immediate Anesthesia Transfer of Care Note  Patient: Kelly Thornton  Procedure(s) Performed: EXCISION OF TONGUE LESION (Left )  Patient Location: PACU  Anesthesia Type:General  Level of Consciousness: awake, alert  and oriented  Airway & Oxygen Therapy: Patient Spontanous Breathing and Patient connected to face mask oxygen  Post-op Assessment: Report given to RN and Post -op Vital signs reviewed and stable  Post vital signs: Reviewed and stable  Last Vitals:  Vitals Value Taken Time  BP 165/83 10/02/2018 11:22 AM  Temp 36.5 C 10/02/2018 11:24 AM  Pulse 112 10/02/2018 11:26 AM  Resp 12 10/02/2018 11:26 AM  SpO2 94 % 10/02/2018 11:26 AM  Vitals shown include unvalidated device data.  Last Pain:  Vitals:   10/02/18 1124  TempSrc:   PainSc: 0-No pain      Patients Stated Pain Goal: 3 (68/08/81 1031)  Complications: No apparent anesthesia complications

## 2018-10-02 NOTE — Anesthesia Preprocedure Evaluation (Signed)
Anesthesia Evaluation  Patient identified by MRN, date of birth, ID band Patient awake    Reviewed: Allergy & Precautions, NPO status , Patient's Chart, lab work & pertinent test results  History of Anesthesia Complications (+) PONV  Airway Mallampati: II  TM Distance: >3 FB Neck ROM: Full    Dental no notable dental hx.    Pulmonary asthma ,    Pulmonary exam normal breath sounds clear to auscultation       Cardiovascular hypertension, Pt. on medications + DOE  Normal cardiovascular exam Rhythm:Regular Rate:Normal     Neuro/Psych Anxiety Depression negative neurological ROS  negative psych ROS   GI/Hepatic negative GI ROS, Neg liver ROS,   Endo/Other  diabetes, Type 2Morbid obesity  Renal/GU negative Renal ROS  negative genitourinary   Musculoskeletal  (+) Arthritis , Osteoarthritis,  Fibromyalgia -  Abdominal (+) + obese,   Peds negative pediatric ROS (+)  Hematology negative hematology ROS (+)   Anesthesia Other Findings   Reproductive/Obstetrics negative OB ROS                             Anesthesia Physical Anesthesia Plan  ASA: III  Anesthesia Plan: General   Post-op Pain Management:    Induction: Intravenous  PONV Risk Score and Plan: 4 or greater and Ondansetron, Dexamethasone, Midazolam and Scopolamine patch - Pre-op  Airway Management Planned: Oral ETT  Additional Equipment:   Intra-op Plan:   Post-operative Plan: Extubation in OR  Informed Consent: I have reviewed the patients History and Physical, chart, labs and discussed the procedure including the risks, benefits and alternatives for the proposed anesthesia with the patient or authorized representative who has indicated his/her understanding and acceptance.   Dental advisory given  Plan Discussed with: CRNA  Anesthesia Plan Comments:         Anesthesia Quick Evaluation

## 2018-10-05 ENCOUNTER — Encounter (HOSPITAL_COMMUNITY): Payer: Self-pay | Admitting: Otolaryngology

## 2018-10-05 NOTE — Anesthesia Postprocedure Evaluation (Signed)
Anesthesia Post Note  Patient: Anslie Claudie Revering  Procedure(s) Performed: EXCISION OF TONGUE LESION (Left )     Patient location during evaluation: PACU Anesthesia Type: General Level of consciousness: awake and alert Pain management: pain level controlled Vital Signs Assessment: post-procedure vital signs reviewed and stable Respiratory status: spontaneous breathing, nonlabored ventilation and respiratory function stable Cardiovascular status: blood pressure returned to baseline and stable Postop Assessment: no apparent nausea or vomiting Anesthetic complications: no    Last Vitals:  Vitals:   10/02/18 1137 10/02/18 1150  BP: (!) 152/90   Pulse: (!) 106   Resp: 11   Temp:  36.5 C  SpO2: (!) 88%     Last Pain:  Vitals:   10/02/18 1150  TempSrc:   PainSc: 0-No pain                 Lynda Rainwater

## 2018-10-12 DIAGNOSIS — J0141 Acute recurrent pansinusitis: Secondary | ICD-10-CM | POA: Insufficient documentation

## 2018-12-02 NOTE — Progress Notes (Signed)
Cardiology Office Note   Date:  12/03/2018   ID:  Kelly, Thornton 08-24-1961, MRN [id number]  PCP:  Lujean Amel, MD  Cardiologist:   Jenkins Rouge, MD   No chief complaint on file.     History of Present Illness: Kelly Thornton is a 57 y.o. female who presents for consultation for PVC;s and tachycardia. 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 Anxiety   . Arthritis   . Asthma   . Cough variant asthma 04/02/2018   FENO 04/02/2018  =   11 on advair 250 one bid - Spirometry 04/02/2018  FEV1 2.39 (84%)  Ratio 87 with truncation of peak flow p advair prior  - 04/02/2018  After extensive coaching inhaler device  effectiveness =    75% from a baseline of 50% so try symbicort 80  1-2 bid    . Depression   . Diabetes mellitus without complication ([place])   . Dyspnea   . Endometriosis   . Fibromyalgia   . Hyperlipidemia   . Hypertension   . Irritable bowel syndrome (IBS)   . Morbid obesity ([place])   . PONV (postoperative nausea and vomiting)   . Sjogren's disease ([place])   . Thyroid disease     Past Surgical History:  Procedure Laterality Date  . bladder stretching    . DIAGNOSTIC LAPAROSCOPY     x4  . EXCISION OF TONGUE LESION Left 10/02/2018   Procedure: EXCISION OF TONGUE LESION;  Surgeon: Kelly Belfast, MD;  Location: [facility];  Service: ENT;  Laterality: Left;  . EYE SURGERY    . NASAL SEPTUM SURGERY    . TONSILLECTOMY       Current Outpatient  Medications  Medication Sig Dispense Refill  . albuterol (ACCUNEB) 0.63 MG/3ML nebulizer solution Take 1 ampule by nebulization 3 (three) times daily as needed for wheezing.    [name] albuterol (PROVENTIL HFA;VENTOLIN HFA) 108 (90 BASE) MCG/ACT inhaler Inhale 2 puffs into the lungs every 6 (six) hours as needed for wheezing or shortness of breath.    [name] amitriptyline (ELAVIL) 100 MG tablet Take 100 mg by mouth at bedtime.    . budesonide-formoterol (SYMBICORT) 160-4.5 MCG/ACT inhaler Inhale 2 puffs into the lungs 2 (two) times daily.    [name] buPROPion (WELLBUTRIN XL) 150 MG 24 hr tablet Take 150 mg by mouth daily.    . carboxymethylcellulose (REFRESH TEARS) 0.5 % SOLN Place 1 drop into both eyes 3 (three) times daily as needed.    . cetirizine (ZYRTEC) 10 MG tablet Take 10 mg by mouth daily.    . Cholecalciferol (VITAMIN D) 2000 units tablet Take 2,000 Units by mouth daily.     [name] EPINEPHrine 0.3 mg/0.3 mL IJ SOAJ injection Inject 0.3 mg into the muscle as needed (seafood).     . Eyelid Cleansers (AVENOVA) 0.01 % SOLN Place 1 application into both  eyes 2 (two) times daily.    . fluticasone (FLONASE) 50 MCG/ACT nasal spray Place 2 sprays into both nostrils at bedtime.    . irbesartan (AVAPRO) 150 MG tablet Take 150 mg by mouth daily. Unsure of strength     . levothyroxine (SYNTHROID, LEVOTHROID) 200 MCG tablet Take 200 mcg by mouth daily before breakfast.     . Magnesium 400 MG TABS Take 400 mg by mouth.    . metFORMIN (GLUCOPHAGE) 500 MG tablet Take 500 mg by mouth daily.     . Multiple Vitamin (MULTIVITAMIN) capsule Take 1 capsule by mouth daily.    [name] NASAL SALINE NA Place 2 sprays into the nose every morning.     . pentosan polysulfate (ELMIRON) 100 MG capsule Take 200 mg by mouth 2 (two) times daily.    . rosuvastatin (CRESTOR) 5 MG tablet Take 5 mg by mouth daily.    . traZODone (DESYREL) 50 MG tablet Take 50 mg by mouth 3 (three) times daily.    [name] triamterene-hydrochlorothiazide (DYAZIDE) 37.5-25 MG  capsule Take 1 capsule by mouth daily.    [name] venlafaxine XR (EFFEXOR-XR) 150 MG 24 hr capsule Take 150 mg by mouth daily with breakfast.     No current facility-administered medications for this visit.     Allergies:   Isopropyl alcohol; Mineral oil; Monosodium glutamate; Nickel; Other; Pneumovax [pneumococcal polysaccharide vaccine]; Rubbing alcohol [alcohol]; Shellfish allergy; Silicone dioxide [silica]; Ace inhibitors; Adhesive [tape]; Avelox [moxifloxacin hcl in nacl]; Chocolate; Cocoa; Codeine; Darvon [propoxyphene]; Fluorescein-benoxinate; Hydrocodone-acetaminophen; Lisinopril; Povidone-iodine; Procaine; Simvastatin; Tetracyclines & related; Bextra [valdecoxib]; Biaxin [clarithromycin]; Ceclor [cefaclor]; Doxycycline; Erythromycin; Hibiclens [chlorhexidine gluconate]; Keflex [cephalexin]; Melatonin; Penicillins; and Thimerosal    Social History:  The patient  reports that she has never smoked. She has never used smokeless tobacco. She reports previous alcohol use. She reports that she does not use drugs.   Family History:  The patient's family history includes Cancer in an other family member; Heart disease in an other family member; Hyperlipidemia in an other family member; Hypertension in an other family member; Sleep apnea in an other family member.    ROS:  Please see the history of present illness.   Otherwise, review of systems are positive for none.   All other systems are reviewed and negative.    PHYSICAL EXAM: VS:  BP 126/78   Pulse (!) 101   Ht 5\' 6"  (1.676 m)   Wt (!) 370 lb (167.8 kg)   BMI 59.72 kg/m  , BMI Body mass index is 59.72 kg/m. Affect appropriate Healthy:  appears stated age 89: normal Neck supple with no adenopathy JVP normal no bruits no thyromegaly Lungs clear with no wheezing and good diaphragmatic motion Heart:  S1/S2 no murmur, no rub, gallop or click PMI normal Abdomen: benighn, BS positve, no tenderness, no AAA no bruit.  No HSM or HJR Distal  pulses intact with no bruits No edema Neuro non-focal Skin warm and dry No muscular weakness    EKG:  ST rate 107 PVC 3 beat VT positive in 2,3, F    Recent Labs: 09/25/2018: BUN 20; Creatinine, Ser 0.95; Hemoglobin 13.3; Platelets 231; Potassium 3.6; Sodium 138    Lipid Panel No results found for: CHOL, TRIG, HDL, CHOLHDL, VLDL, LDLCALC, LDLDIRECT    Wt Readings from Last 3 Encounters:  12/03/18 (!) 370 lb (167.8 kg)  10/02/18 (!) 372 lb 14.4 oz (169.1 kg)  09/25/18 (!) 372 lb 14.4 oz (169.1 kg)  Other studies Reviewed: Additional studies/ records that were reviewed today include: Notes from primary labs, ECG and TTE.    ASSESSMENT AND PLAN:  1.  Tachycardia:  Related to DM and obesity TTE normal earlier this year 48 hour holter to assess If average HR less than 100 and PVCls not that frequent would not start beta blocker  2.  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 HTN:  Well controlled.  Continue current medications and low sodium Dash type diet.   4. DM:  Discussed low carb diet.  Target hemoglobin A1c is 6.5 or less.  Continue current medications. 5. HLD:  Continue statin labs with primary  6. Asthma:  Continue inhales try to avoid beta blocker if possible given bronchospasm   Current medicines are reviewed at length with the patient today.  The patient does not have concerns regarding medicines.  The following changes have been made:  None   Labs/ tests ordered today include: 48 hour holter   Orders Placed This Encounter  Procedures  . HOLTER MONITOR - 48 HOUR  . EKG 12-Lead     Disposition:   FU with cardiology next available    Signed, Jenkins Rouge, MD  12/03/2018 4:53 PM    [facility] [address], [place], [place]  [address] Phone: [phone]; Fax: [phone]

## 2018-12-03 ENCOUNTER — Ambulatory Visit (INDEPENDENT_AMBULATORY_CARE_PROVIDER_SITE_OTHER): Payer: PRIVATE HEALTH INSURANCE | Admitting: Cardiovascular Disease

## 2018-12-03 VITALS — BP 126/78 | HR 101 | Ht 66.0 in | Wt 370.0 lb

## 2018-12-03 DIAGNOSIS — R000 Tachycardia, unspecified: Secondary | ICD-10-CM

## 2018-12-03 DIAGNOSIS — R002 Palpitations: Secondary | ICD-10-CM

## 2018-12-03 NOTE — Patient Instructions (Addendum)
Medication Instructions:   If you need a refill on your cardiac medications before your next appointment, please call your pharmacy.   Lab work:  If you have labs (blood work) drawn today and your tests are completely normal, you will receive your results only by: [name] MyChart Message (if you have MyChart) OR . A paper copy in the mail If you have any lab test that is abnormal or we need to change your treatment, we will call you to review the results.  Testing/Procedures: Your physician has recommended that you wear 48 hour holter monitor. Holter monitors are medical devices that record the heart's electrical activity. Doctors most often use these monitors to diagnose arrhythmias. Arrhythmias are problems with the speed or rhythm of the heartbeat. The monitor is a small, portable device. You can wear one while you do your normal daily activities. This is usually used to diagnose what is causing palpitations/syncope (passing out).  Follow-Up: At [facility], you and your health needs are our priority.  As part of our continuing mission to provide you with exceptional heart care, we have created designated Provider Care Teams.  These Care Teams include your primary Cardiologist (physician) and Advanced Practice Providers (APPs -  Physician Assistants and Nurse Practitioners) who all work together to provide you with the care you need, when you need it. Your physician recommends that you schedule a follow-up appointment next available with Dr. Johnsie Cancel after holter monitor has been worn and returned.

## 2018-12-04 ENCOUNTER — Encounter: Payer: Self-pay | Admitting: Internal Medicine

## 2018-12-04 ENCOUNTER — Ambulatory Visit (INDEPENDENT_AMBULATORY_CARE_PROVIDER_SITE_OTHER): Payer: PRIVATE HEALTH INSURANCE | Admitting: Internal Medicine

## 2018-12-04 VITALS — BP 106/60 | HR 97 | Ht 66.5 in | Wt 367.6 lb

## 2018-12-04 DIAGNOSIS — J45991 Cough variant asthma: Secondary | ICD-10-CM | POA: Diagnosis not present

## 2018-12-04 NOTE — Progress Notes (Signed)
Subjective:     Patient ID: Kelly Thornton, female   DOB: Apr 23, 1961,     MRN: [id number]    Brief patient profile:  57 yowf never smoker works as Medical laboratory scientific officer  for [employer] with sinus problems at age 57 = unable to breath thru nose but subsequently pattern of freq sinusitis developed req for abx  rx up to 4 x per year then around age 57 developed more of a chronic asthma pattern on advair since around 2001 and on it ever since and starting in Sept 2018 more doe so referred to pulmonary clinic 04/02/2018 by Dr  [name]    Steven's eval dust, ragweed  Luna Fuse for Sjogren's    History of Present Illness  04/02/2018 1st [facility] office visit/ Jevaeh Shams   Chief Complaint  Patient presents with  . Pulmonary Consult    Referred by Dr. [name]. Pt states she was dxed with Asthma in 1990. She has had increased SOB and wheezing x 8 months.   new onset sob assoc with throat clearing  X sept 2018 / ok sleeping on side  Also chronic dry mouth secondary to Sjogren's Using rescue inhaler once a month now avg  twice daily / worse with lysol exp  Doe = MMRC3 = can't walk 100 yards even at a slow pace at a flat grade s stopping due to sob rec Stop advair  Start Symbicort 80 Take 1  puffs first thing in am and then another 1 puffs about 12 hours later Work on inhaler technique:  Only use your albuterol as a rescue medication GERD diet    05/19/2018  f/u ov/Caralynn Gelber re: asthma f/u  Chief Complaint  Patient presents with  . Follow-up    Increased cough and SOB since 05/03/18. She is coughing up minimal green sputum. She states she gets SOB walking approx 8 steps.   She is using her albuterol inhaler 6 x per day on average and neb about once per wk.   more HB since early may then started cough / sob 05/03/18     And up to 6 x daily saba where previously still several times daily despite symb 80 2bid also assoc nasal congestion  Doe = MMRC3 = can't walk 100 yards even at a slow pace at a flat grade s  stopping due to sob   Sleeps ok one pillow  rec Symbicort 160 Take 2 puffs first thing in am and then another 2 puffs about 12 hours later and if throat clearing gets worse go back to the 80 strength Work on inhaler technique:   Pantoprazole (protonix) 40 mg   Take  30-60 min before first meal of the day and Pepcid (famotidine)  20 mg one @  bedtime until return to office - this is the best way to tell whether stomach acid is contributing to your problem.  If not better pednisone 10 mg take  4 each am x 2 days,   2 each am x 2 days,  1 each am x 2 days and stop      08/26/2018  f/u ov/Skylar Priest re: asthma  Chief Complaint  Patient presents with  . Follow-up    Pt states she is about the same since last visit. States she is having to use her rescue inhaler twice a day. Pt has c/o cough which is nonproductive, SOB, and chest tightness.  Dyspnea:  Can do HT shopping but needs HC parking / knee limits her from doing  steps   Cough: still some throat clearing Sleeping: ok one pillow  SABA use: twice daily   Once around 4 pm walking to car/ in am too but not clear why  02: none  rec Next refill change to symbicort 80 Take 2 puffs first thing in am and then another 2 puffs about 12 hours later.  Work on inhaler technique:   Please schedule a follow up visit in 3 months but call sooner if needed  - add spacer next ov if not better       12/04/2018  f/u ov/Aaralyn Kil re: asthma/ rhinitis  "Wilburn Cornelia gave up on me"/ varies symb 80 use  Chief Complaint  Patient presents with  . Follow-up    cough is better, breathing is better, wheezing about the same, O2 levels around upper 90's, lower 90's    Dyspnea:  MMRC3 = can't walk 100 yards even at a slow pace at a flat grade s stopping due to sob / both knees hurts lowest sat 94% Cough: never noct/ work > home Sleeping: on side / 1 pillow  SABA use: twice a week while symb 80 one bid  02: none   No obvious day to day or daytime variability or assoc  excess/ purulent sputum or mucus plugs or hemoptysis or cp or chest tightness, or overt sinus or hb symptoms.   Sleeping as above  without nocturnal  or early am exacerbation  of respiratory  c/o's or need for noct saba. Also denies any obvious fluctuation of symptoms with weather or environmental changes or other aggravating or alleviating factors except as outlined above   No unusual exposure hx or h/o childhood pna/ asthma or knowledge of premature birth.  Current Allergies, Complete Past Medical History, Past Surgical History, Family History, and Social History were reviewed in [facility] record.  ROS  The following are not active complaints unless bolded Hoarseness, sore throat, dysphagia, dental problems, itching, sneezing,  nasal congestion or discharge of excess mucus or purulent secretions, ear ache,   fever, chills, sweats, unintended wt loss or wt gain, classically pleuritic or exertional cp,  orthopnea pnd or arm/hand swelling  or leg swelling, presyncope, palpitations, abdominal pain, anorexia, nausea, vomiting, diarrhea  or change in bowel habits or change in bladder habits, change in stools or change in urine, dysuria, hematuria,  rash, arthralgias, visual complaints, headache, numbness, weakness or ataxia or problems with walking or coordination,  change in mood or  memory.        Current Meds  Medication Sig  . albuterol (ACCUNEB) 0.63 MG/3ML nebulizer solution Take 1 ampule by nebulization 3 (three) times daily as needed for wheezing.  [name] albuterol (PROVENTIL HFA;VENTOLIN HFA) 108 (90 BASE) MCG/ACT inhaler Inhale 2 puffs into the lungs every 6 (six) hours as needed for wheezing or shortness of breath.  [name] amitriptyline (ELAVIL) 100 MG tablet Take 100 mg by mouth at bedtime.  . budesonide-formoterol (SYMBICORT) 160-4.5 MCG/ACT inhaler Inhale 2 puffs into the lungs 2 (two) times daily.  [name] buPROPion (WELLBUTRIN XL) 150 MG 24 hr tablet Take 150 mg by mouth daily.   . carboxymethylcellulose (REFRESH TEARS) 0.5 % SOLN Place 1 drop into both eyes 3 (three) times daily as needed.  . cetirizine (ZYRTEC) 10 MG tablet Take 10 mg by mouth daily.  . Cholecalciferol (VITAMIN D) 2000 units tablet Take 2,000 Units by mouth daily.   [name] EPINEPHrine 0.3 mg/0.3 mL IJ SOAJ injection Inject 0.3 mg into the muscle as  needed (seafood).   . Eyelid Cleansers (AVENOVA) 0.01 % SOLN Place 1 application into both eyes 2 (two) times daily.  . fluticasone (FLONASE) 50 MCG/ACT nasal spray Place 2 sprays into both nostrils at bedtime.  . irbesartan (AVAPRO) 150 MG tablet Take 150 mg by mouth daily. Unsure of strength   . levothyroxine (SYNTHROID, LEVOTHROID) 200 MCG tablet Take 200 mcg by mouth daily before breakfast.   . Magnesium 400 MG TABS Take 400 mg by mouth.  . metFORMIN (GLUCOPHAGE) 500 MG tablet Take 500 mg by mouth daily.   . Multiple Vitamin (MULTIVITAMIN) capsule Take 1 capsule by mouth daily.  [name] NASAL SALINE NA Place 2 sprays into the nose every morning.   . pentosan polysulfate (ELMIRON) 100 MG capsule Take 200 mg by mouth 2 (two) times daily.  . rosuvastatin (CRESTOR) 5 MG tablet Take 5 mg by mouth daily.  . traZODone (DESYREL) 50 MG tablet Take 50 mg by mouth 3 (three) times daily.  [name] triamterene-hydrochlorothiazide (DYAZIDE) 37.5-25 MG capsule Take 1 capsule by mouth daily.  [name] venlafaxine XR (EFFEXOR-XR) 150 MG 24 hr capsule Take 150 mg by mouth daily with breakfast.                Objective:   Physical Exam    obese pleasant wf nad/ psueudowheeze resolves with plm    12/04/2018      367  08/26/2018          376  05/19/2018         376   04/02/18 (!) 381 lb (172.8 kg)  06/14/14 (!) 353 lb 14.4 oz (160.5 kg)        HEENT: nl dentition,  and oropharynx. Nl external ear canals without cough reflex - severe  bilateral non-specific turbinate edema     NECK :  without JVD/Nodes/TM/ nl carotid upstrokes bilaterally   LUNGS: no acc muscle use,  Nl contour  chest with transmitter upper airway noises only without cough on insp or exp maneuvers   CV:  RRR  no s3 or murmur or increase in P2, and no edema   ABD:  Quite obese soft and nontender with limited  inspiratory excursion in the supine position. No bruits or organomegaly appreciated, bowel sounds nl  MS:  Nl gait/ ext warm without deformities, calf tenderness, cyanosis or clubbing No obvious joint restrictions   SKIN: warm and dry without lesions    NEURO:  alert, approp, nl sensorium with  no motor or cerebellar deficits apparent.        Assessment:

## 2018-12-04 NOTE — Patient Instructions (Addendum)
symbicort 80 or 160  can be used up to 2 pffs every 12hours  Or tapered down to zero but monitor your albuterol use and use restart the symbicort if albuterol use goes up   [facility] to use beta blockers but my preference use of bisoprolol that works     If you are satisfied with your treatment plan,  let your doctor know and he/she can either refill your medications or you can return here when your prescription runs out.     If in any way you are not 100% satisfied,  please tell us.  If 100% better, tell your friends!  Pulmonary follow up is as needed

## 2018-12-06 ENCOUNTER — Encounter: Payer: Self-pay | Admitting: Internal Medicine

## 2018-12-06 NOTE — Assessment & Plan Note (Signed)
Body mass index is 58.44 kg/m.  -  trending down/ encouraged No results found for: TSH   Contributing to gerd risk/ doe/reviewed the need and the process to achieve and maintain neg calorie balance > defer f/u primary care including intermittently monitoring thyroid status     I had an extended summary final discussion with the patient reviewing all relevant studies completed to date and  lasting 15 to 20 minutes of a 25 minute visit    See device teaching which extended face to face time for this visit.  Each maintenance medication was reviewed in detail including emphasizing most importantly the difference between maintenance and prns and under what circumstances the prns are to be triggered using an action plan format that is not reflected in the computer generated alphabetically organized AVS which I have not found useful in most complex patients, especially with respiratory illnesses  Please see AVS for specific instructions unique to this visit that I personally wrote and verbalized to the the pt in detail and then reviewed with pt  by my nurse highlighting any  changes in therapy recommended at today's visit to their plan of care.      >>>  Pulmonary f/u is prn

## 2018-12-06 NOTE — Assessment & Plan Note (Addendum)
FENO 04/02/2018  =   11 on advair 250 one bid - Spirometry 04/02/2018  FEV1 2.39 (84%)  Ratio 87 with truncation of peak flow p advair prior  - 04/02/2018   try symbicort 80  1-2 bid   - PFT's  05/19/2018  FEV1 2.60 (90 % ) ratio 91  p 17 % improvement from saba p nothing prior to study(since symb 80 x 2 x  12h prior ) with DLCO  81 % corrects to 122  % for alv volume   - 05/19/2018  After extensive coaching inhaler device  effectiveness =    75% from a baseline of 50%  - Allergy profile 05/19/2018 >  Eos 0.0 /  IgE  < 2 RAST neg    - FENO 08/26/2018  =   13 p symb 160 > changed to 80 2bid due to hoarseness/throat clearing    - 12/04/2018  After extensive coaching inhaler device,  effectiveness =    75% (short Ti)   I'm not confident about the dx of asthma here but if present it is only mild and a minimum component of her problem which is mostly related to obesity.    Based on two studies from NEJM  378; 20 p 1865 (2018) and 380 : p2020-30 (2019) in pts with mild asthma it is reasonable to use low dose symbicort eg 80 2bid "prn" flare in this setting but I emphasized this was only shown with symbicort and takes advantage of the rapid onset of action but is not the same as "rescue therapy" but can be stopped once the acute symptoms have resolved and the need for rescue has been minimized (< 2 x weekly)    >>> so rec symb 80 or 160 1-2 up to q 12h "prn" and f/u for refills with pcp or here if desired (optional)   Needs ent f/u for nasal obst/ pnds contributing to cough/ pseudowheeze   Also advised: if considering Beta blocker>>>  In the setting of respiratory symptoms of unknown etiology,  It would be preferable to use bystolic, the most beta -1  selective Beta blocker available in sample form, with bisoprolol the most selective generic choice  on the market, at least on a trial basis, to make sure the spillover Beta 2 effects of the less specific Beta blockers are not contributing to this patient's  symptoms.

## 2018-12-28 ENCOUNTER — Ambulatory Visit (INDEPENDENT_AMBULATORY_CARE_PROVIDER_SITE_OTHER): Payer: PRIVATE HEALTH INSURANCE

## 2018-12-28 DIAGNOSIS — R002 Palpitations: Secondary | ICD-10-CM | POA: Diagnosis not present

## 2018-12-28 DIAGNOSIS — R000 Tachycardia, unspecified: Secondary | ICD-10-CM | POA: Diagnosis not present

## 2018-12-29 ENCOUNTER — Ambulatory Visit: Payer: PRIVATE HEALTH INSURANCE | Admitting: Cardiovascular Disease

## 2019-01-04 NOTE — Progress Notes (Signed)
Cardiology Office Note   Date:  01/05/2019   ID:  Kelly, Thornton June 10, 1961, MRN [id number]  PCP:  Lujean Amel, MD  Cardiologist:   Jenkins Rouge, MD   No chief complaint on file.     History of Present Illness: Kelly Thornton is a 58 y.o. female first seen 11/2018  for PVC;s and tachycardia. 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 Ablation or AAT. Avoid beta blocker with asthma   Past Medical History:  Diagnosis Date  . Anxiety   . Arthritis   . Asthma   . Cough variant asthma 04/02/2018   FENO 04/02/2018  =   11 on advair 250 one bid - Spirometry 04/02/2018  FEV1 2.39 (84%)  Ratio 87 with truncation of peak flow p advair prior  - 04/02/2018  After extensive coaching inhaler device  effectiveness =    75% from a baseline of 50% so try symbicort 80  1-2 bid    . Depression   . Diabetes mellitus without complication ([place])   . Dyspnea   . Endometriosis   . Fibromyalgia   . Hyperlipidemia   . Hypertension   . Irritable bowel syndrome (IBS)   . Morbid obesity ([place])   . PONV (postoperative nausea and vomiting)   . Sjogren's disease ([place])   . Thyroid disease     Past Surgical History:  Procedure Laterality Date  . bladder stretching    . DIAGNOSTIC LAPAROSCOPY     x4  . EXCISION OF TONGUE LESION Left 10/02/2018   Procedure: EXCISION OF TONGUE LESION;  Surgeon: Kelly Belfast, MD;   Location: [facility];  Service: ENT;  Laterality: Left;  . EYE SURGERY    . NASAL SEPTUM SURGERY    . TONSILLECTOMY       Current Outpatient Medications  Medication Sig Dispense Refill  . albuterol (ACCUNEB) 0.63 MG/3ML nebulizer solution Take 1 ampule by nebulization 3 (three) times daily as needed for wheezing.    [name] albuterol (PROVENTIL HFA;VENTOLIN HFA) 108 (90 BASE) MCG/ACT inhaler Inhale 2 puffs into the lungs every 6 (six) hours as needed for wheezing or shortness of breath.    [name] amitriptyline (ELAVIL) 100 MG tablet Take 100 mg by mouth at bedtime.    . budesonide-formoterol (SYMBICORT) 160-4.5 MCG/ACT inhaler Inhale 2 puffs into the lungs 2 (two) times daily.    [name] buPROPion (WELLBUTRIN XL) 150 MG 24 hr tablet Take 150 mg by mouth daily.    . carboxymethylcellulose (REFRESH TEARS) 0.5 % SOLN Place 1 drop into both eyes 3 (three) times daily as needed.    . cetirizine (ZYRTEC) 10 MG tablet Take 10 mg by mouth daily.    . Cholecalciferol (VITAMIN D) 2000 units tablet Take 2,000 Units by mouth daily.     [name]  EPINEPHrine 0.3 mg/0.3 mL IJ SOAJ injection Inject 0.3 mg into the muscle as needed (seafood).     . Eyelid Cleansers (AVENOVA) 0.01 % SOLN Place 1 application into both eyes 2 (two) times daily.    . fluticasone (FLONASE) 50 MCG/ACT nasal spray Place 2 sprays into both nostrils at bedtime.    . irbesartan (AVAPRO) 150 MG tablet Take 150 mg by mouth daily. Unsure of strength     . levothyroxine (SYNTHROID, LEVOTHROID) 200 MCG tablet Take 200 mcg by mouth daily before breakfast.     . Magnesium 400 MG TABS Take 400 mg by mouth.    . metFORMIN (GLUCOPHAGE) 500 MG tablet Take 500 mg by mouth daily.     . Multiple Vitamin (MULTIVITAMIN) capsule Take 1 capsule by mouth daily.    [name] NASAL SALINE NA Place 2 sprays into the nose every morning.     . pentosan polysulfate (ELMIRON) 100 MG capsule Take 200 mg by mouth 2 (two) times daily.    . rosuvastatin (CRESTOR) 5 MG tablet Take 5 mg by mouth  daily.    . traZODone (DESYREL) 50 MG tablet Take 50 mg by mouth 3 (three) times daily.    [name] triamterene-hydrochlorothiazide (DYAZIDE) 37.5-25 MG capsule Take 1 capsule by mouth daily.    [name] venlafaxine XR (EFFEXOR-XR) 150 MG 24 hr capsule Take 150 mg by mouth daily with breakfast.     No current facility-administered medications for this visit.     Allergies:   Isopropyl alcohol; Mineral oil; Monosodium glutamate; Nickel; Other; Pneumovax [pneumococcal polysaccharide vaccine]; Rubbing alcohol [alcohol]; Shellfish allergy; Silicone dioxide [silica]; Ace inhibitors; Adhesive [tape]; Avelox [moxifloxacin hcl in nacl]; Chocolate; Cocoa; Codeine; Darvon [propoxyphene]; Fluorescein-benoxinate; Hydrocodone-acetaminophen; Lisinopril; Povidone-iodine; Procaine; Simvastatin; Tetracyclines & related; Bextra [valdecoxib]; Biaxin [clarithromycin]; Ceclor [cefaclor]; Doxycycline; Erythromycin; Hibiclens [chlorhexidine gluconate]; Keflex [cephalexin]; Melatonin; Penicillins; and Thimerosal    Social History:  The patient  reports that she has never smoked. She has never used smokeless tobacco. She reports previous alcohol use. She reports that she does not use drugs.   Family History:  The patient's family history includes Cancer in an other family member; Heart disease in an other family member; Hyperlipidemia in an other family member; Hypertension in an other family member; Sleep apnea in an other family member.    ROS:  Please see the history of present illness.   Otherwise, review of systems are positive for none.   All other systems are reviewed and negative.    PHYSICAL EXAM: VS:  BP 128/74   Pulse (!) 112   Ht 5' 6.5" (1.689 m)   Wt (!) 366 lb (166 kg)   SpO2 97%   BMI 58.19 kg/m  , BMI Body mass index is 58.19 kg/m. Affect appropriate Healthy:  appears stated age 66: normal Neck supple with no adenopathy JVP normal no bruits no thyromegaly Lungs clear with no wheezing and good  diaphragmatic motion Heart:  S1/S2 no murmur, no rub, gallop or click PMI normal Abdomen: benighn, BS positve, no tenderness, no AAA no bruit.  No HSM or HJR Distal pulses intact with no bruits No edema Neuro non-focal Skin warm and dry No muscular weakness    EKG:  ST rate 107 PVC 3 beat VT positive in 2,3, F    Recent Labs: 09/25/2018: BUN 20; Creatinine, Ser 0.95; Hemoglobin 13.3; Platelets 231; Potassium 3.6; Sodium 138    Lipid Panel No results found for: CHOL, TRIG, HDL, CHOLHDL, VLDL, LDLCALC, LDLDIRECT  Wt Readings from Last 3 Encounters:  01/05/19 (!) 366 lb (166 kg)  12/04/18 (!) 367 lb 9.6 oz (166.7 kg)  12/03/18 (!) 370 lb (167.8 kg)      Other studies Reviewed: Additional studies/ records that were reviewed today include: Notes from primary labs, ECG and TTE.    ASSESSMENT AND PLAN:  1.  Tachycardia:  Related to DM and obesity Has foci of PVC/NSVT by holter Which is 25% of beats Beta blocker not ideal given asthma. Refer to EP for Ablative Rx alternatively AAT but I suspect she has a right sided foci of arrhythmia 2.  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 HTN:  Well controlled.  Continue current medications and low sodium Dash type diet.   4. DM:  Discussed low carb diet.  Target hemoglobin A1c is 6.5 or less.  Continue current medications. 5. HLD:  Continue statin labs with primary  6. Asthma:  Continue inhales try to avoid beta blocker if possible given bronchospasm   Current medicines are reviewed at length with the patient today.  The patient does not have concerns regarding medicines.  The following changes have been made:  None   Labs/ tests ordered today include:  None   No orders of the defined types were placed in this encounter.    Disposition:   Refer to EP    Signed, Jenkins Rouge, MD  01/05/2019 8:26 AM    [facility] [address], [place], [place]  [address] Phone:  [phone]; Fax: [phone]

## 2019-01-05 ENCOUNTER — Ambulatory Visit (INDEPENDENT_AMBULATORY_CARE_PROVIDER_SITE_OTHER): Payer: PRIVATE HEALTH INSURANCE | Admitting: Cardiovascular Disease

## 2019-01-05 ENCOUNTER — Encounter: Payer: Self-pay | Admitting: Cardiovascular Disease

## 2019-01-05 ENCOUNTER — Encounter: Payer: Self-pay | Admitting: *Deleted

## 2019-01-05 VITALS — BP 128/74 | HR 112 | Ht 66.5 in | Wt 366.0 lb

## 2019-01-05 DIAGNOSIS — R000 Tachycardia, unspecified: Secondary | ICD-10-CM | POA: Diagnosis not present

## 2019-01-05 DIAGNOSIS — I493 Ventricular premature depolarization: Secondary | ICD-10-CM | POA: Diagnosis not present

## 2019-01-05 DIAGNOSIS — E785 Hyperlipidemia, unspecified: Secondary | ICD-10-CM

## 2019-01-05 DIAGNOSIS — I10 Essential (primary) hypertension: Secondary | ICD-10-CM | POA: Diagnosis not present

## 2019-01-05 DIAGNOSIS — I4729 Other ventricular tachycardia: Secondary | ICD-10-CM

## 2019-01-05 DIAGNOSIS — I472 Ventricular tachycardia: Secondary | ICD-10-CM | POA: Diagnosis not present

## 2019-01-05 NOTE — Patient Instructions (Addendum)
Medication Instructions:   If you need a refill on your cardiac medications before your next appointment, please call your pharmacy.   Lab work:  If you have labs (blood work) drawn today and your tests are completely normal, you will receive your results only by: [name] MyChart Message (if you have MyChart) OR . A paper copy in the mail If you have any lab test that is abnormal or we need to change your treatment, we will call you to review the results.  Testing/Procedures: None ordered today.  Follow-Up: You have been referred to Dr. Curt Bears for Ablation next available.

## 2019-01-15 ENCOUNTER — Encounter: Payer: Self-pay | Admitting: Cardiology

## 2019-01-15 ENCOUNTER — Ambulatory Visit (INDEPENDENT_AMBULATORY_CARE_PROVIDER_SITE_OTHER): Payer: PRIVATE HEALTH INSURANCE | Admitting: Cardiology

## 2019-01-15 VITALS — BP 124/64 | HR 106 | Ht 66.5 in | Wt 371.0 lb

## 2019-01-15 DIAGNOSIS — I493 Ventricular premature depolarization: Secondary | ICD-10-CM

## 2019-01-15 MED ORDER — FLECAINIDE ACETATE 50 MG PO TABS: 50.0000 mg | ORAL_TABLET | Freq: Two times a day (BID) | ORAL | 3 refills | Status: DC

## 2019-01-15 MED ORDER — DILTIAZEM HCL ER COATED BEADS 180 MG PO CP24: 180.0000 mg | ORAL_CAPSULE | Freq: Every day | ORAL | 3 refills | Status: DC

## 2019-01-15 NOTE — Progress Notes (Signed)
Electrophysiology Office Note   Date:  01/15/2019   ID:  Kelly, Kelly Thornton, MRN [id number]  PCP:  Lujean Amel, MD  Cardiologist:  Johnsie Cancel Primary Electrophysiologist:  Tyson Masin Meredith Leeds, MD    No chief complaint on file.    History of Present Illness: Kelly Thornton is a 58 y.o. female who is being seen today for the evaluation of PVCs at the request of Kelly Hector, MD. Presenting today for electrophysiology evaluation.  She has a history significant for asthma, diabetes, hypertension, hyperlipidemia, morbid obesity, Sjogren's disease, and fibromyalgia.  She was referred to cardiology initially with PVCs.  She wore a cardiac monitor that showed 25% PVCs, and salvos of nonsustained VT with up to 12 beats.  She had an echocardiogram that showed a normal ejection fraction.  Today, she denies symptoms of chest pain, shortness of breath, orthopnea, PND, lower extremity edema, claudication, dizziness, presyncope, syncope, bleeding, or neurologic sequela. The patient is tolerating medications without difficulties.  She does continue to notice palpitations.  She is having palpitations most days.  There is no exacerbating or alleviating factor.   Past Medical History:  Diagnosis Date  . Anxiety   . Arthritis   . Asthma   . Cough variant asthma 04/02/2018   FENO 04/02/2018  =   11 on advair 250 one bid - Spirometry 04/02/2018  FEV1 2.39 (84%)  Ratio 87 with truncation of peak flow p advair prior  - 04/02/2018  After extensive coaching inhaler device  effectiveness =    75% from a baseline of 50% so try symbicort 80  1-2 bid    . Depression   . Diabetes mellitus without complication ([place])   . Dyspnea   . Endometriosis   . Fibromyalgia   . Hyperlipidemia   . Hypertension   . Irritable bowel syndrome (IBS)   . Morbid obesity ([place])   . PONV (postoperative nausea and vomiting)   . Sjogren's disease ([place])   . Thyroid disease    Past Surgical History:  Procedure Laterality  Date  . bladder stretching    . DIAGNOSTIC LAPAROSCOPY     x4  . EXCISION OF TONGUE LESION Left 10/02/2018   Procedure: EXCISION OF TONGUE LESION;  Surgeon: [name], MD;  Location: [facility];  Service: ENT;  Laterality: Left;  . EYE SURGERY    . NASAL SEPTUM SURGERY    . TONSILLECTOMY       Current Outpatient Medications  Medication Sig Dispense Refill  . albuterol (ACCUNEB) 0.63 MG/3ML nebulizer solution Take 1 ampule by nebulization 3 (three) times daily as needed for wheezing.    [name] albuterol (PROVENTIL HFA;VENTOLIN HFA) 108 (90 BASE) MCG/ACT inhaler Inhale 2 puffs into the lungs every 6 (six) hours as needed for wheezing or shortness of breath.    [name] amitriptyline (ELAVIL) 100 MG tablet Take 100 mg by mouth at bedtime.    . budesonide-formoterol (SYMBICORT) 160-4.5 MCG/ACT inhaler Inhale 2 puffs into the lungs 2 (two) times daily.    [name] buPROPion (WELLBUTRIN XL) 150 MG 24 hr tablet Take 150 mg by mouth daily.    . carboxymethylcellulose (REFRESH TEARS) 0.5 % SOLN Place 1 drop into both eyes 3 (three) times daily as needed.    . cetirizine (ZYRTEC) 10 MG tablet Take 10 mg by mouth daily.    . Cholecalciferol (VITAMIN D) 2000 units tablet Take 2,000 Units by mouth daily.     [name] EPINEPHrine 0.3 mg/0.3 mL IJ SOAJ injection  Inject 0.3 mg into the muscle as needed (seafood).     . Eyelid Cleansers (AVENOVA) 0.01 % SOLN Place 1 application into both eyes 2 (two) times daily.    . fluticasone (FLONASE) 50 MCG/ACT nasal spray Place 2 sprays into both nostrils at bedtime.    . irbesartan (AVAPRO) 150 MG tablet Take 150 mg by mouth daily. Unsure of strength     . levothyroxine (SYNTHROID, LEVOTHROID) 200 MCG tablet Take 200 mcg by mouth daily before breakfast.     . Magnesium 400 MG TABS Take 400 mg by mouth.    . metFORMIN (GLUCOPHAGE) 500 MG tablet Take 500 mg by mouth daily.     . Multiple Vitamin (MULTIVITAMIN) capsule Take 1 capsule by mouth daily.    [name] NASAL SALINE NA Place 2 sprays  into the nose every morning.     . pentosan polysulfate (ELMIRON) 100 MG capsule Take 200 mg by mouth 2 (two) times daily.    . rosuvastatin (CRESTOR) 5 MG tablet Take 5 mg by mouth daily.    . traZODone (DESYREL) 50 MG tablet Take 50 mg by mouth 3 (three) times daily.    [name] triamterene-hydrochlorothiazide (DYAZIDE) 37.5-25 MG capsule Take 1 capsule by mouth daily.    [name] venlafaxine XR (EFFEXOR-XR) 150 MG 24 hr capsule Take 150 mg by mouth daily with breakfast.    . diltiazem (CARDIZEM CD) 180 MG 24 hr capsule Take 1 capsule (180 mg total) by mouth daily. 30 capsule 3  . flecainide (TAMBOCOR) 50 MG tablet Take 1 tablet (50 mg total) by mouth 2 (two) times daily. 60 tablet 3   No current facility-administered medications for this visit.     Allergies:   Isopropyl alcohol; Mineral oil; Monosodium glutamate; Nickel; Other; Pneumovax [pneumococcal polysaccharide vaccine]; Rubbing alcohol [alcohol]; Shellfish allergy; Silicone dioxide [silica]; Ace inhibitors; Adhesive [tape]; Avelox [moxifloxacin hcl in nacl]; Chocolate; Cocoa; Codeine; Darvon [propoxyphene]; Fluorescein-benoxinate; Hydrocodone-acetaminophen; Lisinopril; Povidone-iodine; Procaine; Simvastatin; Tetracyclines & related; Bextra [valdecoxib]; Biaxin [clarithromycin]; Ceclor [cefaclor]; Doxycycline; Erythromycin; Hibiclens [chlorhexidine gluconate]; Keflex [cephalexin]; Melatonin; Penicillins; and Thimerosal   Social History:  The patient  reports that she has never smoked. She has never used smokeless tobacco. She reports previous alcohol use. She reports that she does not use drugs.   Family History:  The patient's family history includes Cancer in an other family member; Heart disease in an other family member; Hyperlipidemia in an other family member; Hypertension in an other family member; Sleep apnea in an other family member.    ROS:  Please see the history of present illness.   Otherwise, review of systems is positive for fatigue,  leg swelling, palpitations, cough, dizziness.   All other systems are reviewed and negative.    PHYSICAL EXAM: VS:  BP 124/64   Pulse (!) 106   Ht 5' 6.5" (1.689 m)   Wt (!) 371 lb (168.3 kg)   BMI 58.98 kg/m  , BMI Body mass index is 58.98 kg/m. GEN: Well nourished, well developed, in no acute distress  HEENT: normal  Neck: no JVD, carotid bruits, or masses Cardiac: RRR; no murmurs, rubs, or gallops,no edema  Respiratory:  clear to auscultation bilaterally, normal work of breathing GI: soft, nontender, nondistended, + BS MS: no deformity or atrophy  Skin: warm and dry Neuro:  Strength and sensation are intact Psych: euthymic mood, full affect  EKG:  EKG is ordered today. Personal review of the ekg ordered shows sinus rhythm, rate 106, PVC   Recent Labs:  09/25/2018: BUN 20; Creatinine, Ser 0.95; Hemoglobin 13.3; Platelets 231; Potassium 3.6; Sodium 138    Lipid Panel  No results found for: CHOL, TRIG, HDL, CHOLHDL, VLDL, LDLCALC, LDLDIRECT   Wt Readings from Last 3 Encounters:  01/15/19 (!) 371 lb (168.3 kg)  01/05/19 (!) 366 lb (166 kg)  12/04/18 (!) 367 lb 9.6 oz (166.7 kg)      Other studies Reviewed: Additional studies/ records that were reviewed today include: TTE 01/16/18  Review of the above records today demonstrates:  - Left ventricle: The cavity size was normal. Wall thickness was   increased in a pattern of mild LVH. Systolic function was normal.   The estimated ejection fraction was in the range of 55% to 60%.   The study is not technically sufficient to allow evaluation of LV   diastolic function.  Holter 01/04/18 - personally reviewed NSR Average HR 101 range 78-141 bpm Frequent PVCls and NSVT longest range 12 beats Ventricular ectopy 25% total beats Refer to EP for suppressive Rx   ASSESSMENT AND PLAN:  1.  PVCs: High burden at 25%.  Point, I would prefer to avoid procedures, as her risk of complications would be high due to her weight.  She  fortunately has had a normal echo.  She does have symptoms of palpitations.  I Zalmen Wrightsman thus start her on flecainide and diltiazem.  2.  Hypertension: Well-controlled today  3.  Hyperlipidemia: Statin per primary cardiology.   Current medicines are reviewed at length with the patient today.   The patient does not have concerns regarding her medicines.  The following changes were made today: Start flecainide, diltiazem  Labs/ tests ordered today include:  Orders Placed This Encounter  Procedures  . EKG 12-Lead   Discussed with primary cardiology  Disposition:   FU with Adalid Beckmann 3 months  Signed, Aleera Gilcrease Meredith Leeds, MD  01/15/2019 12:07 PM     [facility] [address] [place] [address] [phone] (office) [phone] (fax)

## 2019-01-15 NOTE — Patient Instructions (Addendum)
Medication Instructions:  Your physician has recommended you make the following change in your medication:  1. START Flecainide 50 mg twice daily 2. START Diltiazem 180 mg once daily  * If you need a refill on your cardiac medications before your next appointment, please call your pharmacy.   Labwork: None ordered  Testing/Procedures: None ordered  Follow-Up: Your physician recommends that you schedule a follow-up appointment in: 3 months with Dr. Curt Bears.   *Please note that any paperwork needing to be filled out by the provider will need to be addressed at the front desk prior to seeing the provider. Please note that any FMLA, disability or other documents regarding health condition is subject to a $25.00 charge that must be received prior to completion of paperwork in the form of a money order or check.  Thank you for choosing [facility]!!   Trinidad Curet, RN [phone]  Any Other Special Instructions Will Be Listed Below (If Applicable).   Diltiazem tablets What is this medicine? DILTIAZEM (dil TYE a zem) is a calcium-channel blocker. It affects the amount of calcium found in your heart and muscle cells. This relaxes your blood vessels, which can reduce the amount of work the heart has to do. This medicine is used to treat chest pain caused by angina. This medicine may be used for other purposes; ask your health care provider or pharmacist if you have questions. COMMON BRAND NAME(S): Cardizem What should I tell my health care provider before I take this medicine? They need to know if you have any of these conditions: -heart problems, low blood pressure, irregular heartbeat -liver disease -previous heart attack -an unusual or allergic reaction to diltiazem, other medicines, foods, dyes, or preservatives -pregnant or trying to get pregnant -breast-feeding How should I use this medicine? Take this medicine by mouth with a glass of water. Follow the directions on the  prescription label. Do not cut, crush or chew this medicine. This medicine is usually taken before meals and at bedtime. Take your doses at regular intervals. Do not take your medicine more often then directed. Do not stop taking except on the advice of your doctor or health care professional. Talk to your pediatrician regarding the use of this medicine in children. Special care may be needed. Overdosage: If you think you have taken too much of this medicine contact a poison control center or emergency room at once. NOTE: This medicine is only for you. Do not share this medicine with others. What if I miss a dose? If you miss a dose, take it as soon as you can. If it is almost time for your next dose, take only that dose. Do not take double or extra doses. What may interact with this medicine? Do not take this medicine with any of the following: -cisapride -hawthorn -pimozide -ranolazine -red yeast rice This medicine may also interact with the following medications: -buspirone -carbamazepine -cimetidine -cyclosporine -digoxin -local anesthetics or general anesthetics -lovastatin -medicines for anxiety or difficulty sleeping like midazolam and triazolam -medicines for high blood pressure or heart problems -quinidine -rifampin, rifabutin, or rifapentine This list may not describe all possible interactions. Give your health care provider a list of all the medicines, herbs, non-prescription drugs, or dietary supplements you use. Also tell them if you smoke, drink alcohol, or use illegal drugs. Some items may interact with your medicine. What should I watch for while using this medicine? Check your blood pressure and pulse rate regularly. Ask your doctor or health care  professional what your blood pressure and pulse rate should be and when you should contact him or her. You may feel dizzy or lightheaded. Do not drive, use machinery, or do anything that needs mental alertness until you know how  this medicine affects you. To reduce the risk of dizzy or fainting spells, do not sit or stand up quickly, especially if you are an older patient. Alcohol can make you more dizzy or increase flushing and rapid heartbeats. Avoid alcoholic drinks. What side effects may I notice from receiving this medicine? Side effects that you should report to your doctor or health care professional as soon as possible: -allergic reactions like skin rash, itching or hives, swelling of the face, lips, or tongue -confusion, mental depression -feeling faint or lightheaded, falls -pinpoint red spots on the skin -redness, blistering, peeling or loosening of the skin, including inside the mouth -slow, irregular heartbeat -swelling of the ankles, feet -unusual bleeding or bruising Side effects that usually do not require medical attention (report to your doctor or health care professional if they continue or are bothersome): -change in sex drive or performance -constipation or diarrhea -flushing of the face -headache -nausea, vomiting -tired or weak -trouble sleeping This list may not describe all possible side effects. Call your doctor for medical advice about side effects. You may report side effects to FDA at 1-800-FDA-1088. Where should I keep my medicine? Keep out of the reach of children. Store at room temperature between 20 and 25 degrees C (68 and 77 degrees F). Protect from light. Keep container tightly closed. Throw away any unused medicine after the expiration date. NOTE: This sheet is a summary. It may not cover all possible information. If you have questions about this medicine, talk to your doctor, pharmacist, or health care provider.  2019 Elsevier/Gold Standard (2013-11-22 10:54:31)     Flecainide tablets What is this medicine? FLECAINIDE (FLEK a nide) is an antiarrhythmic drug. This medicine is used to prevent irregular heart rhythm. It can also slow down fast heartbeats called  tachycardia. This medicine may be used for other purposes; ask your health care provider or pharmacist if you have questions. COMMON BRAND NAME(S): Tambocor What should I tell my health care provider before I take this medicine? They need to know if you have any of these conditions: -abnormal levels of potassium in the blood -heart disease including heart rhythm and heart rate problems -kidney or liver disease -recent heart attack -an unusual or allergic reaction to flecainide, local anesthetics, other medicines, foods, dyes, or preservatives -pregnant or trying to get pregnant -breast-feeding How should I use this medicine? Take this medicine by mouth with a glass of water. Follow the directions on the prescription label. You can take this medicine with or without food. Take your doses at regular intervals. Do not take your medicine more often than directed. Do not stop taking this medicine suddenly. This may cause serious, heart-related side effects. If your doctor wants you to stop the medicine, the dose may be slowly lowered over time to avoid any side effects. Talk to your pediatrician regarding the use of this medicine in children. While this drug may be prescribed for children as young as 1 year of age for selected conditions, precautions do apply. Overdosage: If you think you have taken too much of this medicine contact a poison control center or emergency room at once. NOTE: This medicine is only for you. Do not share this medicine with others. What if I miss a dose?  If you miss a dose, take it as soon as you can. If it is almost time for your next dose, take only that dose. Do not take double or extra doses. What may interact with this medicine? Do not take this medicine with any of the following medications: -amoxapine -arsenic trioxide -certain antibiotics like clarithromycin, erythromycin, gatifloxacin, gemifloxacin, levofloxacin, moxifloxacin, sparfloxacin, or  troleandomycin -certain antidepressants called tricyclic antidepressants like amitriptyline, imipramine, or nortriptyline -certain medicines to control heart rhythm like disopyramide, dofetilide, encainide, moricizine, procainamide, propafenone, and quinidine -cisapride -cyclobenzaprine -delavirdine -droperidol -haloperidol -hawthorn -imatinib -levomethadyl -maprotiline -medicines for malaria like chloroquine and halofantrine -pentamidine -phenothiazines like chlorpromazine, mesoridazine, prochlorperazine, thioridazine -pimozide -quinine -ranolazine -ritonavir -sertindole -ziprasidone This medicine may also interact with the following medications: -cimetidine -medicines for angina or high blood pressure -medicines to control heart rhythm like amiodarone and digoxin This list may not describe all possible interactions. Give your health care provider a list of all the medicines, herbs, non-prescription drugs, or dietary supplements you use. Also tell them if you smoke, drink alcohol, or use illegal drugs. Some items may interact with your medicine. What should I watch for while using this medicine? Visit your doctor or health care professional for regular checks on your progress. Because your condition and the use of this medicine carries some risk, it is a good idea to carry an identification card, necklace or bracelet with details of your condition, medications and doctor or health care professional. Check your blood pressure and pulse rate regularly. Ask your health care professional what your blood pressure and pulse rate should be, and when you should contact him or her. Your doctor or health care professional also may schedule regular blood tests and electrocardiograms to check your progress. You may get drowsy or dizzy. Do not drive, use machinery, or do anything that needs mental alertness until you know how this medicine affects you. Do not stand or sit up quickly, especially if  you are an older patient. This reduces the risk of dizzy or fainting spells. Alcohol can make you more dizzy, increase flushing and rapid heartbeats. Avoid alcoholic drinks. What side effects may I notice from receiving this medicine? Side effects that you should report to your doctor or health care professional as soon as possible: -chest pain, continued irregular heartbeats -difficulty breathing -swelling of the legs or feet -trembling, shaking -unusually weak or tired Side effects that usually do not require medical attention (report to your doctor or health care professional if they continue or are bothersome): -blurred vision -constipation -headache -nausea, vomiting -stomach pain This list may not describe all possible side effects. Call your doctor for medical advice about side effects. You may report side effects to FDA at 1-800-FDA-1088. Where should I keep my medicine? Keep out of the reach of children. Store at room temperature between 15 and 30 degrees C (59 and 86 degrees F). Protect from light. Keep container tightly closed. Throw away any unused medicine after the expiration date. NOTE: This sheet is a summary. It may not cover all possible information. If you have questions about this medicine, talk to your doctor, pharmacist, or health care provider.  2019 Elsevier/Gold Standard (2008-04-13 16:46:09)

## 2019-02-05 ENCOUNTER — Emergency Department (HOSPITAL_COMMUNITY)
Admission: EM | Admit: 2019-02-05 | Discharge: 2019-02-06 | Disposition: A | Discharge status: Home / Self Care | Payer: No Typology Code available for payment source | Attending: Emergency Medicine | Admitting: Emergency Medicine

## 2019-02-05 ENCOUNTER — Encounter (HOSPITAL_COMMUNITY): Payer: Self-pay | Admitting: Emergency Medicine

## 2019-02-05 DIAGNOSIS — J4541 Moderate persistent asthma with (acute) exacerbation: Secondary | ICD-10-CM

## 2019-02-05 DIAGNOSIS — I10 Essential (primary) hypertension: Secondary | ICD-10-CM | POA: Insufficient documentation

## 2019-02-05 DIAGNOSIS — J111 Influenza due to unidentified influenza virus with other respiratory manifestations: Secondary | ICD-10-CM | POA: Insufficient documentation

## 2019-02-05 DIAGNOSIS — E119 Type 2 diabetes mellitus without complications: Secondary | ICD-10-CM | POA: Insufficient documentation

## 2019-02-05 DIAGNOSIS — R0602 Shortness of breath: Secondary | ICD-10-CM | POA: Diagnosis present

## 2019-02-05 DIAGNOSIS — Z7984 Long term (current) use of oral hypoglycemic drugs: Secondary | ICD-10-CM | POA: Insufficient documentation

## 2019-02-05 DIAGNOSIS — J45909 Unspecified asthma, uncomplicated: Secondary | ICD-10-CM | POA: Insufficient documentation

## 2019-02-05 DIAGNOSIS — Z79899 Other long term (current) drug therapy: Secondary | ICD-10-CM | POA: Insufficient documentation

## 2019-02-05 LAB — CBC WITH DIFFERENTIAL/PLATELET
Abs Immature Granulocytes: 0.02 10*3/uL (ref 0.00–0.07)
BASOS ABS: 0 10*3/uL (ref 0.0–0.1)
Basophils Relative: 0 %
EOS PCT: 0 %
Eosinophils Absolute: 0 10*3/uL (ref 0.0–0.5)
HCT: 43.5 % (ref 36.0–46.0)
HEMOGLOBIN: 13.6 g/dL (ref 12.0–15.0)
Immature Granulocytes: 0 %
LYMPHS PCT: 11 %
Lymphs Abs: 0.8 10*3/uL (ref 0.7–4.0)
MCH: 30.4 pg (ref 26.0–34.0)
MCHC: 31.3 g/dL (ref 30.0–36.0)
MCV: 97.1 fL (ref 80.0–100.0)
Monocytes Absolute: 0.4 10*3/uL (ref 0.1–1.0)
Monocytes Relative: 5 %
NRBC: 0 % (ref 0.0–0.2)
Neutro Abs: 6.1 10*3/uL (ref 1.7–7.7)
Neutrophils Relative %: 84 %
Platelets: 191 10*3/uL (ref 150–400)
RBC: 4.48 MIL/uL (ref 3.87–5.11)
RDW: 13.5 % (ref 11.5–15.5)
WBC: 7.3 10*3/uL (ref 4.0–10.5)

## 2019-02-05 LAB — COMPREHENSIVE METABOLIC PANEL
ALT: 32 U/L (ref 0–44)
ANION GAP: 13 (ref 5–15)
AST: 37 U/L (ref 15–41)
Albumin: 3.8 g/dL (ref 3.5–5.0)
Alkaline Phosphatase: 73 U/L (ref 38–126)
BUN: 20 mg/dL (ref 6–20)
CHLORIDE: 105 mmol/L (ref 98–111)
CO2: 20 mmol/L — ABNORMAL LOW (ref 22–32)
CREATININE: 1.25 mg/dL — AB (ref 0.44–1.00)
Calcium: 9 mg/dL (ref 8.9–10.3)
GFR, EST AFRICAN AMERICAN: 55 mL/min — AB (ref >60)
GFR, EST NON AFRICAN AMERICAN: 47 mL/min — AB (ref >60)
Glucose, Bld: 157 mg/dL — ABNORMAL HIGH (ref 70–99)
POTASSIUM: 4.5 mmol/L (ref 3.5–5.1)
Sodium: 138 mmol/L (ref 135–145)
Total Bilirubin: 0.5 mg/dL (ref 0.3–1.2)
Total Protein: 6.4 g/dL — ABNORMAL LOW (ref 6.5–8.1)

## 2019-02-05 LAB — I-STAT BETA HCG BLOOD, ED (MC, WL, AP ONLY)

## 2019-02-05 MED ORDER — ALBUTEROL SULFATE (2.5 MG/3ML) 0.083% IN NEBU
INHALATION_SOLUTION | RESPIRATORY_TRACT | Status: AC
  Administered 2019-02-05: 5 mg
  Filled 2019-02-05: qty 6

## 2019-02-05 MED ORDER — SODIUM CHLORIDE 0.9% FLUSH: 3.0000 mL | Freq: Once | INTRAVENOUS | Status: DC

## 2019-02-05 NOTE — ED Provider Notes (Signed)
[place] EMERGENCY DEPARTMENT Provider Note   CSN: [id number] Arrival date & time: 02/05/19  1755     History   Chief Complaint Chief Complaint  Patient presents with  . Shortness of Breath    HPI Kelly Thornton is a 58 y.o. female.  Patient presents to the emergency department for evaluation of worsening shortness of breath.  Patient reports that she has been sick for several days.  She was seen by her doctor 2 days ago and had a positive flu test.  She was started on Tamiflu.  Patient has a history of asthma, has been on prednisone for 2 days and has been using her nebulizer every 4 hours.  She reports that she was told to come to the ER for oxygen dropped below 90%.  She does check her pulse ox at home, today oxygen saturations were 88%.  She does not have oxygen at home.  She presented to the ER for further evaluation.     Past Medical History:  Diagnosis Date  . Anxiety   . Arthritis   . Asthma   . Cough variant asthma 04/02/2018   FENO 04/02/2018  =   11 on advair 250 one bid - Spirometry 04/02/2018  FEV1 2.39 (84%)  Ratio 87 with truncation of peak flow p advair prior  - 04/02/2018  After extensive coaching inhaler device  effectiveness =    75% from a baseline of 50% so try symbicort 80  1-2 bid    . Depression   . Diabetes mellitus without complication ([place])   . Dyspnea   . Endometriosis   . Fibromyalgia   . Hyperlipidemia   . Hypertension   . Irritable bowel syndrome (IBS)   . Morbid obesity ([place])   . PONV (postoperative nausea and vomiting)   . Sjogren's disease ([place])   . Thyroid disease     Patient Active Problem List   Diagnosis Date Noted  . Acute recurrent pansinusitis 10/12/2018  . Tongue mass 09/14/2018  . DOE (dyspnea on exertion) 04/03/2018  . Morbid obesity due to excess calories ([place]) 04/03/2018  . Cough variant asthma  vs UACS/ VCD 04/02/2018  . Dry eyes 08/12/2012  . Conjunctivitis 07/08/2012  . Nuclear cataract 07/08/2012    . Chronic interstitial cystitis 05/13/2012  . Other chronic cystitis 05/13/2012  . Abdominal pain 05/13/2012  . Dysuria 05/13/2012  . Increased frequency of urination 05/13/2012  . Irritable bowel syndrome 05/13/2012  . Myalgia and myositis 05/13/2012  . Urinary urgency 05/13/2012    Past Surgical History:  Procedure Laterality Date  . bladder stretching    . DIAGNOSTIC LAPAROSCOPY     x4  . EXCISION OF TONGUE LESION Left 10/02/2018   Procedure: EXCISION OF TONGUE LESION;  Surgeon: [name], MD;  Location: [place];  Service: ENT;  Laterality: Left;  . EYE SURGERY    . NASAL SEPTUM SURGERY    . TONSILLECTOMY       OB History   No obstetric history on file.      Home Medications    Prior to Admission medications   Medication Sig Start Date End Date Taking? Authorizing Provider  albuterol (PROVENTIL HFA;VENTOLIN HFA) 108 (90 BASE) MCG/ACT inhaler Inhale 2 puffs into the lungs every 6 (six) hours as needed for wheezing or shortness of breath.   Yes [provider]  albuterol (PROVENTIL) (2.5 MG/3ML) 0.083% nebulizer solution Take 2.5 mg by nebulization every 6 (six) hours as needed for wheezing  or shortness of breath.  01/20/19  Yes [provider]  amitriptyline (ELAVIL) 100 MG tablet Take 100 mg by mouth at bedtime.   Yes [provider]  budesonide-formoterol (SYMBICORT) 160-4.5 MCG/ACT inhaler Inhale 2 puffs into the lungs 2 (two) times daily.   Yes [provider]  buPROPion (WELLBUTRIN XL) 150 MG 24 hr tablet Take 150 mg by mouth daily.   Yes [provider]  carboxymethylcellulose (REFRESH TEARS) 0.5 % SOLN Place 1 drop into both eyes 3 (three) times daily as needed (for dryness).    Yes [provider]  cetirizine (ZYRTEC) 10 MG tablet Take 10 mg by mouth daily.   Yes [provider]  Cholecalciferol (VITAMIN D) 2000 units tablet Take 2,000 Units by mouth daily.    Yes [provider]  diltiazem  (CARDIZEM CD) 180 MG 24 hr capsule Take 1 capsule (180 mg total) by mouth daily. 01/15/19 04/15/19 Yes Camnitz, Will Hassell Done, MD  EPINEPHrine 0.3 mg/0.3 mL IJ SOAJ injection Inject 0.3 mg into the muscle as needed for anaphylaxis ("from seafood").    Yes [provider]  Eyelid Cleansers (AVENOVA) 0.01 % SOLN Place 1 application into both eyes 2 (two) times daily.   Yes [provider]  flecainide (TAMBOCOR) 50 MG tablet Take 1 tablet (50 mg total) by mouth 2 (two) times daily. 01/15/19  Yes Camnitz, Will Hassell Done, MD  fluocinonide (LIDEX) 0.05 % external solution Apply 1-2 application topically daily. As directed 02/02/19  Yes [provider]  fluticasone (FLONASE) 50 MCG/ACT nasal spray Place 2 sprays into both nostrils at bedtime.   Yes [provider]  irbesartan (AVAPRO) 150 MG tablet Take 150 mg by mouth daily.    Yes [provider]  levothyroxine (SYNTHROID, LEVOTHROID) 200 MCG tablet Take 200 mcg by mouth daily before breakfast.    Yes [provider]  Magnesium 400 MG TABS Take 400 mg by mouth daily.    Yes [provider]  meclizine (ANTIVERT) 25 MG tablet Take 25 mg by mouth every 8 (eight) hours as needed for dizziness.  12/03/18  Yes [provider]  metFORMIN (GLUCOPHAGE-XR) 500 MG 24 hr tablet Take 500 mg by mouth See admin instructions. Take 500 mg by mouth once a day at 4 PM 01/11/19  Yes [provider]  Multiple Vitamin (MULTIVITAMIN) capsule Take 1 capsule by mouth daily.   Yes [provider]  NASAL SALINE NA Place 2 sprays into both nostrils as needed (for dryness or congestion).    Yes [provider]  oseltamivir (TAMIFLU) 75 MG capsule Take 75 mg by mouth 2 (two) times daily. FOR 5 DAYS 02/03/19 02/07/19 Yes [provider]  pentosan polysulfate (ELMIRON) 100 MG capsule Take 200 mg by mouth 2 (two) times daily.   Yes [provider]  rosuvastatin (CRESTOR) 5 MG tablet  Take 5 mg by mouth at bedtime.    Yes [provider]  traZODone (DESYREL) 50 MG tablet Take 50 mg by mouth 3 (three) times daily.   Yes [provider]  triamterene-hydrochlorothiazide (MAXZIDE-25) 37.5-25 MG tablet Take 1 tablet by mouth daily.   Yes [provider]  valsartan (DIOVAN) 160 MG tablet Take 160 mg by mouth daily. 12/05/18  Yes [provider]  venlafaxine XR (EFFEXOR-XR) 150 MG 24 hr capsule Take 150 mg by mouth daily with breakfast.   Yes [provider]  predniSONE (DELTASONE) 20 MG tablet 3 tabs po daily x 3 days, then  2 tabs x 3 days, then 1.5 tabs x 3 days, then 1 tab x 3 days, then 0.5 tabs x 3 days 02/06/19   Orpah Greek, MD    Family History Family History  Problem Relation Age of Onset  . Cancer Other   . Hyperlipidemia Other   . Hypertension Other   . Heart disease Other   . Sleep apnea Other     Social History Social History   Tobacco Use  . Smoking status: Never Smoker  . Smokeless tobacco: Never Used  Substance Use Topics  . Alcohol use: Not Currently  . Drug use: Never     Allergies   Isopropyl alcohol; Mineral oil; Monosodium glutamate; Nickel; Pneumovax [pneumococcal polysaccharide vaccine]; Procaine; Rubbing alcohol [alcohol]; Shellfish allergy; Silicone dioxide [silica]; Ace inhibitors; Adhesive [tape]; Avelox [moxifloxacin hcl in nacl]; Chocolate; Cocoa; Codeine; Darvon [propoxyphene]; Fluorescein-benoxinate; Hydrocodone-acetaminophen; Lisinopril; Simvastatin; Tetracyclines & related; Bextra [valdecoxib]; Biaxin [clarithromycin]; Ceclor [cefaclor]; Doxycycline; Erythromycin; Hibiclens [chlorhexidine gluconate]; Keflex [cephalexin]; Melatonin; Penicillins; Povidone-iodine; and Thimerosal   Review of Systems Review of Systems  Respiratory: Positive for shortness of breath and wheezing.   All other systems reviewed and are negative.    Physical Exam Updated Vital Signs BP 136/65   Pulse  84   Temp 98.4 F (36.9 C) (Oral)   Resp 15   SpO2 96%   Physical Exam   ED Treatments / Results  Labs (all labs ordered are listed, but only abnormal results are displayed) Labs Reviewed  COMPREHENSIVE METABOLIC PANEL - Abnormal; Notable for the following components:      Result Value   CO2 20 (*)    Glucose, Bld 157 (*)    Creatinine, Ser 1.25 (*)    Total Protein 6.4 (*)    GFR calc non Af Amer 47 (*)    GFR calc Af Amer 55 (*)    All other components within normal limits  CBC WITH DIFFERENTIAL/PLATELET  URINALYSIS, ROUTINE W REFLEX MICROSCOPIC  I-STAT BETA HCG BLOOD, ED ([facility], [facility], [facility] ONLY)    EKG EKG Interpretation  Date/Time:  Friday February 05 2019 17:58:54 EST Ventricular Rate:  105 PR Interval:  172 QRS Duration: 100 QT Interval:  354 QTC Calculation: 467 R Axis:   157 Text Interpretation:  Sinus tachycardia Low voltage QRS Left posterior fascicular block Cannot rule out Inferior infarct , age undetermined Cannot rule out Anterior infarct , age undetermined Abnormal ECG No significant change since last tracing Confirmed by Orpah Greek [phone]) on 02/06/2019 12:34:45 AM   Radiology Dg Chest 2 View  Result Date: 02/06/2019 CLINICAL DATA:  Shortness of breath EXAM: CHEST - 2 VIEW COMPARISON:  April 02, 2018 FINDINGS: The heart size and mediastinal contours are stable. Both lungs are clear. The visualized skeletal structures are stable. IMPRESSION: No active cardiopulmonary disease. Electronically Signed   By: Abelardo Diesel M.D.   On: 02/06/2019 00:41    Procedures Procedures (including critical care time)  Medications Ordered in ED Medications  sodium chloride flush (NS) 0.9 % injection 3 mL (has no administration in time range)  predniSONE (DELTASONE) tablet 60 mg (has no administration in time range)  albuterol (PROVENTIL) (2.5 MG/3ML) 0.083% nebulizer solution (5 mg  Given 02/05/19 1812)     Initial Impression / Assessment and Plan / ED Course    I have reviewed the triage vital signs and the nursing notes.  Pertinent labs & imaging results that were available during my care of the patient were reviewed by me  and considered in my medical decision making (see chart for details).     Patient presents to the emergency department for evaluation of shortness of breath.  Patient has a history of asthma.  She is currently being treated for influenza.  She has been on prednisone for 2 days but worsened tonight.  She did have some wheezing and rhonchi present on initial examination.  There is no bronchodilator therapy has been provided.  She feels improved.  She has been ambulatory here in ER, able to walk down the entire length of the hallway without shortness of breath or hypoxia.  Vital signs are stable.  She does not have a fever.  Tachycardia has resolved that she is breathing better.  Oxygen saturations 96% on room air.  She will be appropriate for continued outpatient treatment.  Continue bronchodilators as she has been, every 4 hours.  Will re-dose prednisone to 60 mg and taper down.  She is currently taking a 40 mg burst.  Final Clinical Impressions(s) / ED Diagnoses   Final diagnoses:  Moderate persistent asthma with exacerbation  Influenza    ED Discharge Orders         Ordered    predniSONE (DELTASONE) 20 MG tablet     02/06/19 0350           Orpah Greek, MD 02/06/19 [phone]

## 2019-02-05 NOTE — ED Notes (Signed)
Pt to xray via stretcher

## 2019-02-05 NOTE — ED Triage Notes (Signed)
Patient to ED for worsening URI symptoms - was diagnosed with flu Wednesday - has been taking tamiflu and OTC medications. Hx asthma - told to seek further treatment if oxygen levels dropped below 90% on RA. Patient states it got down to 88% today. Afebrile in triage. Patient reports chest discomfort, productive cough, fevers/chills, and shortness of breath. Sent for possible pneumonia.

## 2019-02-06 ENCOUNTER — Emergency Department (HOSPITAL_COMMUNITY): Payer: No Typology Code available for payment source

## 2019-02-06 DIAGNOSIS — J111 Influenza due to unidentified influenza virus with other respiratory manifestations: Secondary | ICD-10-CM | POA: Diagnosis not present

## 2019-02-06 MED ORDER — PREDNISONE 20 MG PO TABS
60.0000 mg | ORAL_TABLET | Freq: Once | ORAL | Status: AC
  Administered 2019-02-06: 60 mg via ORAL
  Filled 2019-02-06: qty 3

## 2019-02-06 MED ORDER — PREDNISONE 20 MG PO TABS: ORAL_TABLET | ORAL | 0 refills | Status: DC

## 2019-02-06 NOTE — ED Notes (Signed)
Pts oxygen turned off for trial. Pt is not on home 02

## 2019-02-06 NOTE — ED Notes (Signed)
Pt ambulated down the hall from room 31 in front of the nurses station to Trauma [facility] with a pulse ox sensor. Pt O2 stats remained at 95% and 96%. Pt denies any sob and states she has some tightness across her chest due to her cough. Pt ambulated back to her room without any complaint.

## 2019-02-08 LAB — CBG MONITORING, ED: Glucose-Capillary: 84 mg/dL (ref 70–99)

## 2019-04-14 ENCOUNTER — Telehealth: Payer: Self-pay | Admitting: *Deleted

## 2019-04-14 NOTE — Telephone Encounter (Signed)

## 2019-04-20 ENCOUNTER — Other Ambulatory Visit: Payer: Self-pay

## 2019-04-20 ENCOUNTER — Telehealth (INDEPENDENT_AMBULATORY_CARE_PROVIDER_SITE_OTHER): Payer: PRIVATE HEALTH INSURANCE | Admitting: Cardiology

## 2019-04-20 DIAGNOSIS — I493 Ventricular premature depolarization: Secondary | ICD-10-CM

## 2019-04-20 MED ORDER — DILTIAZEM HCL ER COATED BEADS 180 MG PO CP24: 180.0000 mg | ORAL_CAPSULE | Freq: Every day | ORAL | 2 refills | Status: DC

## 2019-04-20 MED ORDER — MEXILETINE HCL 150 MG PO CAPS: 150.0000 mg | ORAL_CAPSULE | Freq: Two times a day (BID) | ORAL | 3 refills | Status: DC

## 2019-04-20 NOTE — Addendum Note (Signed)
Addended by: Stanton Kidney on: 04/20/2019 12:58 PM   Modules accepted: Orders

## 2019-04-20 NOTE — Progress Notes (Signed)
Electrophysiology TeleHealth Note   Due to national recommendations of social distancing due to COVID 19, an audio/video telehealth visit is felt to be most appropriate for this patient at this time.  See Epic message for the patient's consent to telehealth for [facility].   Date:  04/20/2019   ID:  Kelly Thornton, Kelly Thornton 02/11/1961, MRN [id number]  Location: patient's home  Provider location: [address] [place]  Evaluation Performed: Follow-up visit  PCP:  Lujean Amel, MD  Cardiologist:  No primary care provider on file.  Electrophysiologist:  Dr Curt Bears  Chief Complaint:  PVC  History of Present Illness:    Kelly Thornton is a 58 y.o. female who presents via audio/video conferencing for a telehealth visit today.  Since last being seen in our clinic, the patient reports doing very well.  Today, she denies symptoms of palpitations, chest pain, shortness of breath,  lower extremity edema, dizziness, presyncope, or syncope.  The patient is otherwise without complaint today.  The patient denies symptoms of fevers, chills, cough, or new SOB worrisome for COVID 19.  She has a history of asthma, diabetes, hypertension, hyperlipidemia, morbid obesity, Sjogren's syndrome, and fibromyalgia.  She wore a heart monitor that showed 25% PVCs and salvos of nonsustained VT.  Today, denies symptoms of palpitations, chest pain, shortness of breath, orthopnea, PND, lower extremity edema, claudication, dizziness, presyncope, syncope, bleeding, or neurologic sequela. The patient is tolerating medications without difficulties.  Unfortunately she continues to have symptoms of weakness and fatigue.  She is noted no change in her overall wellbeing since starting the flecainide.  She has been working with a weight loss program and has lost up to 30 pounds since January.  Overall though, she does continue to feel weak and fatigued.  She does say that she has dyslexia and feels that flecainide  is contributed to worsening her dyslexia.  Past Medical History:  Diagnosis Date   Anxiety    Arthritis    Asthma    Cough variant asthma 04/02/2018   FENO 04/02/2018  =   11 on advair 250 one bid - Spirometry 04/02/2018  FEV1 2.39 (84%)  Ratio 87 with truncation of peak flow p advair prior  - 04/02/2018  After extensive coaching inhaler device  effectiveness =    75% from a baseline of 50% so try symbicort 80  1-2 bid     Depression    Diabetes mellitus without complication (HCC)    Dyspnea    Endometriosis    Fibromyalgia    Hyperlipidemia    Hypertension    Irritable bowel syndrome (IBS)    Morbid obesity (HCC)    PONV (postoperative nausea and vomiting)    Sjogren's disease ([place])    Thyroid disease     Past Surgical History:  Procedure Laterality Date   bladder stretching     DIAGNOSTIC LAPAROSCOPY     x4   EXCISION OF TONGUE LESION Left 10/02/2018   Procedure: EXCISION OF TONGUE LESION;  Surgeon: [name], MD;  Location: [place];  Service: ENT;  Laterality: Left;   EYE SURGERY     NASAL SEPTUM SURGERY     TONSILLECTOMY      Current Outpatient Medications  Medication Sig Dispense Refill   albuterol (PROVENTIL HFA;VENTOLIN HFA) 108 (90 BASE) MCG/ACT inhaler Inhale 2 puffs into the lungs every 6 (six) hours as needed for wheezing or shortness of breath.     albuterol (PROVENTIL) (2.5 MG/3ML) 0.083%  nebulizer solution Take 2.5 mg by nebulization every 6 (six) hours as needed for wheezing or shortness of breath.      amitriptyline (ELAVIL) 100 MG tablet Take 100 mg by mouth at bedtime.     budesonide-formoterol (SYMBICORT) 160-4.5 MCG/ACT inhaler Inhale 2 puffs into the lungs 2 (two) times daily.     buPROPion (WELLBUTRIN XL) 150 MG 24 hr tablet Take 150 mg by mouth daily.     carboxymethylcellulose (REFRESH TEARS) 0.5 % SOLN Place 1 drop into both eyes 3 (three) times daily as needed (for dryness).      cetirizine (ZYRTEC) 10 MG tablet Take  10 mg by mouth daily.     Cholecalciferol (VITAMIN D) 2000 units tablet Take 2,000 Units by mouth daily.      EPINEPHrine 0.3 mg/0.3 mL IJ SOAJ injection Inject 0.3 mg into the muscle as needed for anaphylaxis ("from seafood").      Eyelid Cleansers (AVENOVA) 0.01 % SOLN Place 1 application into both eyes 2 (two) times daily.     flecainide (TAMBOCOR) 50 MG tablet Take 1 tablet (50 mg total) by mouth 2 (two) times daily. 60 tablet 3   fluocinonide (LIDEX) 0.05 % external solution Apply 1-2 application topically daily. As directed     fluticasone (FLONASE) 50 MCG/ACT nasal spray Place 2 sprays into both nostrils at bedtime.     irbesartan (AVAPRO) 150 MG tablet Take 150 mg by mouth daily.      levothyroxine (SYNTHROID, LEVOTHROID) 200 MCG tablet Take 200 mcg by mouth daily before breakfast.      Magnesium 400 MG TABS Take 400 mg by mouth daily.      meclizine (ANTIVERT) 25 MG tablet Take 25 mg by mouth every 8 (eight) hours as needed for dizziness.      metFORMIN (GLUCOPHAGE-XR) 500 MG 24 hr tablet Take 500 mg by mouth See admin instructions. Take 500 mg by mouth once a day at 4 PM     Multiple Vitamin (MULTIVITAMIN) capsule Take 1 capsule by mouth daily.     NASAL SALINE NA Place 2 sprays into both nostrils as needed (for dryness or congestion).      pentosan polysulfate (ELMIRON) 100 MG capsule Take 200 mg by mouth 2 (two) times daily.     predniSONE (DELTASONE) 20 MG tablet 3 tabs po daily x 3 days, then 2 tabs x 3 days, then 1.5 tabs x 3 days, then 1 tab x 3 days, then 0.5 tabs x 3 days 27 tablet 0   rosuvastatin (CRESTOR) 5 MG tablet Take 5 mg by mouth at bedtime.      traZODone (DESYREL) 50 MG tablet Take 50 mg by mouth 3 (three) times daily.     triamterene-hydrochlorothiazide (MAXZIDE-25) 37.5-25 MG tablet Take 1 tablet by mouth daily.     valsartan (DIOVAN) 160 MG tablet Take 160 mg by mouth daily.     venlafaxine XR (EFFEXOR-XR) 150 MG 24 hr capsule Take 150 mg by  mouth daily with breakfast.     diltiazem (CARDIZEM CD) 180 MG 24 hr capsule Take 1 capsule (180 mg total) by mouth daily. 30 capsule 3   No current facility-administered medications for this visit.     Allergies:   Isopropyl alcohol; Mineral oil; Monosodium glutamate; Nickel; Pneumovax [pneumococcal polysaccharide vaccine]; Procaine; Rubbing alcohol [alcohol]; Shellfish allergy; Silicone dioxide [silica]; Ace inhibitors; Adhesive [tape]; Avelox [moxifloxacin hcl in nacl]; Chocolate; Cocoa; Codeine; Darvon [propoxyphene]; Fluorescein-benoxinate; Hydrocodone-acetaminophen; Lisinopril; Simvastatin; Tetracyclines & related; Bextra [valdecoxib]; Biaxin [clarithromycin];  Ceclor [cefaclor]; Doxycycline; Erythromycin; Hibiclens [chlorhexidine gluconate]; Keflex [cephalexin]; Melatonin; Penicillins; Povidone-iodine; and Thimerosal   Social History:  The patient  reports that she has never smoked. She has never used smokeless tobacco. She reports previous alcohol use. She reports that she does not use drugs.   Family History:  The patient's  family history includes Cancer in an other family member; Heart disease in an other family member; Hyperlipidemia in an other family member; Hypertension in an other family member; Sleep apnea in an other family member.   ROS:  Please see the history of present illness.   All other systems are personally reviewed and negative.    Exam:    Vital Signs:  BP 113/68    Pulse 98    Wt (!) 344 lb (156 kg)    BMI 54.69 kg/m   Well appearing, alert and conversant, regular work of breathing,  good skin color Eyes- anicteric, neuro- grossly intact, skin- no apparent rash or lesions or cyanosis, mouth- oral mucosa is pink   Labs/Other Tests and Data Reviewed:    Recent Labs: 02/05/2019: ALT 32; BUN 20; Creatinine, Ser 1.25; Hemoglobin 13.6; Platelets 191; Potassium 4.5; Sodium 138   Wt Readings from Last 3 Encounters:  04/20/19 (!) 344 lb (156 kg)  01/15/19 (!) 371 lb  (168.3 kg)  01/05/19 (!) 366 lb (166 kg)     Other studies personally reviewed: Additional studies/ records that were reviewed today include: ECG 02/05/2019 personally reviewed Review of the above records today demonstrates:   Sinus tachycardia   ASSESSMENT & PLAN:    1.  PVCs: 25% on most recent monitor.  High risk of procedural complications due to her weight.  Currently on flecainide and diltiazem.  Unfortunately, she is continued to have weakness and fatigue.  She does not think that things have changed since being on the flecainide.  She is also noted some dyslexia.  Due to that, we Bartt Gonzaga stop flecainide and start her on mexiletine 150 mg twice a day.  She may need higher doses of this medication.  I am concerned that if the medications do not work, she may need either sotalol admission versus ablation.  She Brekyn Huntoon need a refill of her diltiazem.  2.  Hypertension: Currently well controlled  3.  Hyperlipidemia: Statin per primary cardiology.  4.  Morbid obesity: Has been losing weight.  Is using an online service.  COVID 19 screen The patient denies symptoms of COVID 19 at this time.  The importance of social distancing was discussed today.  Follow-up: 3 months  Current medicines are reviewed at length with the patient today.   The patient does not have concerns regarding her medicines.  The following changes were made today: Stop flecainide, start mexiletine  Labs/ tests ordered today include:  No orders of the defined types were placed in this encounter.    Patient Risk:  after full review of this patients clinical status, I feel that they are at moderate risk at this time.  Today, I have spent 12 minutes with the patient with telehealth technology discussing PVCs.    Signed, Takima Encina Meredith Leeds, MD  04/20/2019 10:45 AM     [facility] [address] [place] [address] [phone] (office) [phone] (fax)

## 2019-05-27 MED ORDER — MEXILETINE HCL 150 MG PO CAPS: 150.0000 mg | ORAL_CAPSULE | Freq: Two times a day (BID) | ORAL | 3 refills | Status: DC

## 2019-05-27 NOTE — Telephone Encounter (Signed)
Pt's medication was sent to pt's pharmacy as requested. Confirmation received.  °

## 2019-06-02 ENCOUNTER — Telehealth: Payer: Self-pay | Admitting: Cardiology

## 2019-06-02 NOTE — Telephone Encounter (Signed)
[facility] mail order pharmacy stating that pt's medication Mexiletine 150 mg capsule if out of stock and pharmacy is requesting a alternative Rx be sent to [facility]. Ph# [phone]. Order# [id number]. Please address

## 2019-06-03 NOTE — Telephone Encounter (Signed)
Pt has 1 week supply of medication left. Pt lives near [employer] and works near [facility].  Pt aware I will try and find pharmacy with medication in stock.  Aware I will follow up with her soon.

## 2019-06-04 MED ORDER — MEXILETINE HCL 150 MG PO CAPS: 150.0000 mg | ORAL_CAPSULE | Freq: Two times a day (BID) | ORAL | 3 refills | Status: DC

## 2019-06-04 NOTE — Telephone Encounter (Signed)
Found possibly a month at [facility].  Sent 30 day Rx in, pt aware. Will attempt to find another pharmacy w/ stock for refills. Will f/u w/ pt next week. Pt agreeable to plan.

## 2019-06-07 DIAGNOSIS — H90A21 Sensorineural hearing loss, unilateral, right ear, with restricted hearing on the contralateral side: Secondary | ICD-10-CM | POA: Insufficient documentation

## 2019-06-07 DIAGNOSIS — G253 Myoclonus: Secondary | ICD-10-CM | POA: Insufficient documentation

## 2019-06-30 NOTE — Telephone Encounter (Signed)
Pt reports that she picked up another month at [facility] yesterday.  I will follow up w/ pharmacy about supply, pt aware. Will follow up w/i next month in reference to this.

## 2019-07-19 ENCOUNTER — Telehealth: Payer: Self-pay | Admitting: Cardiology

## 2019-07-19 NOTE — Telephone Encounter (Signed)

## 2019-07-20 ENCOUNTER — Ambulatory Visit (INDEPENDENT_AMBULATORY_CARE_PROVIDER_SITE_OTHER): Payer: PRIVATE HEALTH INSURANCE | Admitting: Cardiology

## 2019-07-20 ENCOUNTER — Other Ambulatory Visit: Payer: Self-pay

## 2019-07-20 ENCOUNTER — Encounter: Payer: Self-pay | Admitting: Cardiology

## 2019-07-20 VITALS — BP 130/72 | HR 92 | Ht 66.5 in | Wt 344.6 lb

## 2019-07-20 DIAGNOSIS — I493 Ventricular premature depolarization: Secondary | ICD-10-CM | POA: Diagnosis not present

## 2019-07-20 MED ORDER — MEXILETINE HCL 250 MG PO CAPS: 250.0000 mg | ORAL_CAPSULE | Freq: Three times a day (TID) | ORAL | 3 refills | Status: DC

## 2019-07-20 NOTE — Progress Notes (Signed)
Electrophysiology Office Note   Date:  07/20/2019   ID:  Kelly Thornton, Kelly Thornton Sep 24, 1961, MRN [id number]  PCP:  Lujean Amel, MD  Cardiologist:  Johnsie Cancel Primary Electrophysiologist:  Takiah Maiden Meredith Leeds, MD    No chief complaint on file.    History of Present Illness: Kelly Thornton is a 58 y.o. female who is being seen today for the evaluation of PVCs at the request of Koirala, Dibas, MD. Presenting today for electrophysiology evaluation.  She has a history significant for asthma, diabetes, hypertension, hyperlipidemia, morbid obesity, Sjogren's disease, and fibromyalgia.  She was referred to cardiology initially with PVCs.  She wore a cardiac monitor that showed 25% PVCs, and salvos of nonsustained VT with up to 12 beats.  She had an echocardiogram that showed a normal ejection fraction.  Today, denies symptoms of palpitations, chest pain, shortness of breath, orthopnea, PND, lower extremity edema, claudication, dizziness, presyncope, syncope, bleeding, or neurologic sequela. The patient is tolerating medications without difficulties.  Overall she feels improved.  She is no longer having number dyslexia as she feels like the flecainide was causing this.  She feels that her energy is improved on the mexiletine.   Past Medical History:  Diagnosis Date  . Anxiety   . Arthritis   . Asthma   . Cough variant asthma 04/02/2018   FENO 04/02/2018  =   11 on advair 250 one bid - Spirometry 04/02/2018  FEV1 2.39 (84%)  Ratio 87 with truncation of peak flow p advair prior  - 04/02/2018  After extensive coaching inhaler device  effectiveness =    75% from a baseline of 50% so try symbicort 80  1-2 bid    . Depression   . Diabetes mellitus without complication ([place])   . Dyspnea   . Endometriosis   . Fibromyalgia   . Hyperlipidemia   . Hypertension   . Irritable bowel syndrome (IBS)   . Morbid obesity ([place])   . PONV (postoperative nausea and vomiting)   . Sjogren's disease ([place])   . Thyroid  disease    Past Surgical History:  Procedure Laterality Date  . bladder stretching    . DIAGNOSTIC LAPAROSCOPY     x4  . EXCISION OF TONGUE LESION Left 10/02/2018   Procedure: EXCISION OF TONGUE LESION;  Surgeon: [name], MD;  Location: [place];  Service: ENT;  Laterality: Left;  . EYE SURGERY    . NASAL SEPTUM SURGERY    . TONSILLECTOMY       Current Outpatient Medications  Medication Sig Dispense Refill  . albuterol (PROVENTIL HFA;VENTOLIN HFA) 108 (90 BASE) MCG/ACT inhaler Inhale 2 puffs into the lungs every 6 (six) hours as needed for wheezing or shortness of breath.    [name] albuterol (PROVENTIL) (2.5 MG/3ML) 0.083% nebulizer solution Take 2.5 mg by nebulization every 6 (six) hours as needed for wheezing or shortness of breath.     [name] amitriptyline (ELAVIL) 100 MG tablet Take 100 mg by mouth at bedtime.    . budesonide-formoterol (SYMBICORT) 160-4.5 MCG/ACT inhaler Inhale 2 puffs into the lungs 2 (two) times daily.    [name] buPROPion (WELLBUTRIN XL) 150 MG 24 hr tablet Take 150 mg by mouth daily.    . carboxymethylcellulose (REFRESH TEARS) 0.5 % SOLN Place 1 drop into both eyes 3 (three) times daily as needed (for dryness).     . cetirizine (ZYRTEC) 10 MG tablet Take 10 mg by mouth daily.    . Cholecalciferol (VITAMIN D) 2000 units  tablet Take 2,000 Units by mouth daily.     [name] diltiazem (CARDIZEM CD) 180 MG 24 hr capsule Take 1 capsule (180 mg total) by mouth daily. 90 capsule 2  . EPINEPHrine 0.3 mg/0.3 mL IJ SOAJ injection Inject 0.3 mg into the muscle as needed for anaphylaxis ("from seafood").     . Eyelid Cleansers (AVENOVA) 0.01 % SOLN Place 1 application into both eyes 2 (two) times daily.    . fluocinonide (LIDEX) 0.05 % external solution Apply 1-2 application topically daily. As directed    . fluticasone (FLONASE) 50 MCG/ACT nasal spray Place 2 sprays into both nostrils at bedtime.    . irbesartan (AVAPRO) 150 MG tablet Take 150 mg by mouth daily.     [name] levothyroxine  (SYNTHROID, LEVOTHROID) 200 MCG tablet Take 200 mcg by mouth daily before breakfast.     . Magnesium 400 MG TABS Take 400 mg by mouth daily.     . meclizine (ANTIVERT) 25 MG tablet Take 25 mg by mouth every 8 (eight) hours as needed for dizziness.     . metFORMIN (GLUCOPHAGE-XR) 500 MG 24 hr tablet Take 500 mg by mouth See admin instructions. Take 500 mg by mouth once a day at 4 PM    . Multiple Vitamin (MULTIVITAMIN) capsule Take 1 capsule by mouth daily.    [name] NASAL SALINE NA Place 2 sprays into both nostrils as needed (for dryness or congestion).     . pentosan polysulfate (ELMIRON) 100 MG capsule Take 200 mg by mouth 2 (two) times daily.    . rosuvastatin (CRESTOR) 5 MG tablet Take 5 mg by mouth at bedtime.     . traZODone (DESYREL) 50 MG tablet Take 50 mg by mouth 3 (three) times daily.    [name] triamterene-hydrochlorothiazide (MAXZIDE-25) 37.5-25 MG tablet Take 1 tablet by mouth daily.    . valsartan (DIOVAN) 160 MG tablet Take 160 mg by mouth daily.    [name] venlafaxine XR (EFFEXOR-XR) 150 MG 24 hr capsule Take 150 mg by mouth daily with breakfast.    . mexiletine (MEXITIL) 250 MG capsule Take 1 capsule (250 mg total) by mouth 3 (three) times daily. 180 capsule 3   No current facility-administered medications for this visit.     Allergies:   Isopropyl alcohol, Mineral oil, Monosodium glutamate, Nickel, Other, Pneumovax [pneumococcal polysaccharide vaccine], Procaine, Rubbing alcohol [alcohol], Shellfish allergy, Silicone dioxide [silica], Ace inhibitors, Adhesive [tape], Avelox [moxifloxacin hcl in nacl], Chocolate, Cocoa, Codeine, Darvon [propoxyphene], Fluorescein-benoxinate, Hydrocodone-acetaminophen, Lisinopril, Simvastatin, Tetracyclines & related, Bextra [valdecoxib], Biaxin [clarithromycin], Ceclor [cefaclor], Doxycycline, Erythromycin, Hibiclens [chlorhexidine gluconate], Keflex [cephalexin], Melatonin, Penicillins, Povidone-iodine, and Thimerosal   Social History:  The patient  reports that  she has never smoked. She has never used smokeless tobacco. She reports previous alcohol use. She reports that she does not use drugs.   Family History:  The patient's family history includes Cancer in an other family member; Heart disease in an other family member; Hyperlipidemia in an other family member; Hypertension in an other family member; Sleep apnea in an other family member.    ROS:  Please see the history of present illness.   Otherwise, review of systems is positive for none.   All other systems are reviewed and negative.   PHYSICAL EXAM: VS:  BP 130/72   Pulse 92   Ht 5' 6.5" (1.689 m)   Wt (!) 344 lb 9.6 oz (156.3 kg)   SpO2 97%   BMI 54.79 kg/m  , BMI Body  mass index is 54.79 kg/m. GEN: Well nourished, well developed, in no acute distress  HEENT: normal  Neck: no JVD, carotid bruits, or masses Cardiac: RRR; no murmurs, rubs, or gallops,no edema  Respiratory:  clear to auscultation bilaterally, normal work of breathing GI: soft, nontender, nondistended, + BS MS: no deformity or atrophy  Skin: warm and dry Neuro:  Strength and sensation are intact Psych: euthymic mood, full affect  EKG:  EKG is ordered today. Personal review of the ekg ordered shows sinus rhythm, PVCs  Recent Labs: 02/05/2019: ALT 32; BUN 20; Creatinine, Ser 1.25; Hemoglobin 13.6; Platelets 191; Potassium 4.5; Sodium 138    Lipid Panel  No results found for: CHOL, TRIG, HDL, CHOLHDL, VLDL, LDLCALC, LDLDIRECT   Wt Readings from Last 3 Encounters:  07/20/19 (!) 344 lb 9.6 oz (156.3 kg)  04/20/19 (!) 344 lb (156 kg)  01/15/19 (!) 371 lb (168.3 kg)      Other studies Reviewed: Additional studies/ records that were reviewed today include: TTE 01/16/18  Review of the above records today demonstrates:  - Left ventricle: The cavity size was normal. Wall thickness was   increased in a pattern of mild LVH. Systolic function was normal.   The estimated ejection fraction was in the range of 55% to  60%.   The study is not technically sufficient to allow evaluation of LV   diastolic function.  Holter 01/04/18 - personally reviewed NSR Average HR 101 range 78-141 bpm Frequent PVCls and NSVT longest range 12 beats Ventricular ectopy 25% total beats Refer to EP for suppressive Rx   ASSESSMENT AND PLAN:  1.  PVCs: Previous pertinent 25%.  Has been started on flecainide and diltiazem.  She did not tolerate the flecainide and has since been switched to mexiletine.  Her PVCs appear to be improved but she is continued to have symptoms.  We Keyarra Rendall thus increase mexiletine to 250 mg twice a day.  She could certainly increase this to 300 mg twice a day if necessary.  2.  Hypertension: Currently well controlled  3.  Hyperlipidemia: Statin per primary cardiology   Current medicines are reviewed at length with the patient today.   The patient does not have concerns regarding her medicines.  The following changes were made today: Start flecainide, diltiazem  Labs/ tests ordered today include:  No orders of the defined types were placed in this encounter.   Disposition:   FU with Brent Taillon 6 months  Signed, Corbin Hott Meredith Leeds, MD  07/20/2019 8:20 AM     [facility] [address] [place] [address] [phone] (office) [phone] (fax)

## 2019-07-20 NOTE — Patient Instructions (Addendum)
Medication Instructions:  Your physician has recommended you make the following change in your medication:  1. INCREASE Mexiletine to 250 mg twice daily  * If you need a refill on your cardiac medications before your next appointment, please call your pharmacy.   Labwork: None ordered  Testing/Procedures: None ordered  Follow-Up: Your physician recommends that you schedule a follow-up appointment in: 3 months with Oda Kilts, PA.  Your physician wants you to follow-up in: 6 months with Dr. Curt Bears.  You will receive a reminder letter in the mail two months in advance. If you don't receive a letter, please call our office to schedule the follow-up appointment.  Thank you for choosing [facility]!!   Trinidad Curet, RN [phone]

## 2019-09-24 ENCOUNTER — Other Ambulatory Visit: Payer: Self-pay | Admitting: Cardiology

## 2019-10-10 ENCOUNTER — Other Ambulatory Visit: Payer: Self-pay | Admitting: Internal Medicine

## 2019-10-12 ENCOUNTER — Ambulatory Visit: Payer: PRIVATE HEALTH INSURANCE | Admitting: Student

## 2019-10-12 NOTE — Progress Notes (Signed)
PCP:  Lujean Amel, MD Primary Cardiologist: No primary care provider on file. Electrophysiologist: Will Meredith Leeds, MD   Kelly Thornton is a 58 y.o. female with past medical history of PVCs, HTN, and HLD who presents today for routine electrophysiology followup. They are seen for Dr. Curt Bears.   Since last being seen in our clinic, the patient reports doing about the same.  She hasn't felt much difference on an increased dose of mexitil. She has PVCs daily, with no clear inciting or relieving factors. She says they are worse during the day.  She drinks an average of 3 caffeinated sodas daily. Denies ETOH use. Her PVCs do not affect her ability to do her ADLs or the things she wants to do. She denies snoring. She has lightheadedness associated with Meniere's disease, takes meclizine. She denies symptoms of chest pain, shortness of breath, orthopnea, PND, lower extremity edema, claudication, presyncope, syncope, bleeding, or neurologic sequela.   Past Medical History:  Diagnosis Date  . Anxiety   . Arthritis   . Asthma   . Cough variant asthma 04/02/2018   FENO 04/02/2018  =   11 on advair 250 one bid - Spirometry 04/02/2018  FEV1 2.39 (84%)  Ratio 87 with truncation of peak flow p advair prior  - 04/02/2018  After extensive coaching inhaler device  effectiveness =    75% from a baseline of 50% so try symbicort 80  1-2 bid    . Depression   . Diabetes mellitus without complication ([place])   . Dyspnea   . Endometriosis   . Fibromyalgia   . Hyperlipidemia   . Hypertension   . Irritable bowel syndrome (IBS)   . Morbid obesity ([place])   . PONV (postoperative nausea and vomiting)   . Sjogren's disease ([place])   . Thyroid disease    Past Surgical History:  Procedure Laterality Date  . bladder stretching    . DIAGNOSTIC LAPAROSCOPY     x4  . EXCISION OF TONGUE LESION Left 10/02/2018   Procedure: EXCISION OF TONGUE LESION;  Surgeon: [name], MD;  Location: [facility];  Service: ENT;   Laterality: Left;  . EYE SURGERY    . NASAL SEPTUM SURGERY    . TONSILLECTOMY      Current Outpatient Medications  Medication Sig Dispense Refill  . albuterol (PROVENTIL HFA;VENTOLIN HFA) 108 (90 BASE) MCG/ACT inhaler Inhale 2 puffs into the lungs every 6 (six) hours as needed for wheezing or shortness of breath.    [name] albuterol (PROVENTIL) (2.5 MG/3ML) 0.083% nebulizer solution Take 2.5 mg by nebulization every 6 (six) hours as needed for wheezing or shortness of breath.     [name] amitriptyline (ELAVIL) 100 MG tablet Take 100 mg by mouth at bedtime.    . budesonide-formoterol (SYMBICORT) 160-4.5 MCG/ACT inhaler Inhale 2 puffs into the lungs 2 (two) times daily.    [name] buPROPion (WELLBUTRIN XL) 150 MG 24 hr tablet Take 150 mg by mouth daily.    . carboxymethylcellulose (REFRESH TEARS) 0.5 % SOLN Place 1 drop into both eyes 3 (three) times daily as needed (for dryness).     . cetirizine (ZYRTEC) 10 MG tablet Take 10 mg by mouth daily.    . Cholecalciferol (VITAMIN D) 2000 units tablet Take 2,000 Units by mouth daily.     [name] diltiazem (CARDIZEM CD) 180 MG 24 hr capsule Take 1 capsule (180 mg total) by mouth daily. 90 capsule 2  . EPINEPHrine 0.3 mg/0.3 mL IJ SOAJ injection Inject  0.3 mg into the muscle as needed for anaphylaxis ("from seafood").     . Eyelid Cleansers (AVENOVA) 0.01 % SOLN Place 1 application into both eyes 2 (two) times daily.    . fluocinonide (LIDEX) 0.05 % external solution Apply 1-2 application topically daily. As directed    . fluticasone (FLONASE) 50 MCG/ACT nasal spray Place 2 sprays into both nostrils at bedtime.    . irbesartan (AVAPRO) 150 MG tablet Take 150 mg by mouth daily.     [name] levothyroxine (SYNTHROID, LEVOTHROID) 200 MCG tablet Take 200 mcg by mouth daily before breakfast.     . Magnesium 400 MG TABS Take 400 mg by mouth daily.     . meclizine (ANTIVERT) 25 MG tablet Take 25 mg by mouth every 8 (eight) hours as needed for dizziness.     . metFORMIN (GLUCOPHAGE-XR)  500 MG 24 hr tablet Take 500 mg by mouth See admin instructions. Take 500 mg by mouth once a day at 4 PM    . mexiletine (MEXITIL) 250 MG capsule Take 1 capsule (250 mg total) by mouth 3 (three) times daily. 180 capsule 3  . Multiple Vitamin (MULTIVITAMIN) capsule Take 1 capsule by mouth daily.    [name] NASAL SALINE NA Place 2 sprays into both nostrils as needed (for dryness or congestion).     . pentosan polysulfate (ELMIRON) 100 MG capsule Take 200 mg by mouth 2 (two) times daily.    . rosuvastatin (CRESTOR) 5 MG tablet Take 5 mg by mouth at bedtime.     . SYMBICORT 80-4.5 MCG/ACT inhaler INHALE TWO PUFFS BY MOUTH TWICE A DAY 10.2 g 0  . traZODone (DESYREL) 50 MG tablet Take 50 mg by mouth 3 (three) times daily.    [name] triamterene-hydrochlorothiazide (MAXZIDE-25) 37.5-25 MG tablet Take 1 tablet by mouth daily.    . valsartan (DIOVAN) 160 MG tablet Take 160 mg by mouth daily.    [name] venlafaxine XR (EFFEXOR-XR) 150 MG 24 hr capsule Take 150 mg by mouth daily with breakfast.     No current facility-administered medications for this visit.     Allergies  Allergen Reactions  . Isopropyl Alcohol Rash  . Mineral Oil Rash  . Monosodium Glutamate Nausea And Vomiting  . Nickel Rash  . Other Anaphylaxis and Other (See Comments)    Other reaction(s): Other (See Comments) gastritis redness PNEUMO VAX, RUBBING ALCOHOL, MINERAL OIL, NICKEL, MSG, SILICON- REACTION  gastritis redness PNEUMO VAX, RUBBING ALCOHOL, MINERAL OIL, NICKEL, MSG, SILICON- REACTION  GI upset  . Pneumovax [Pneumococcal Polysaccharide Vaccine] Rash  . Procaine Shortness Of Breath and Rash  . Rubbing Alcohol [Alcohol] Other (See Comments)    "burns skin"   . Shellfish Allergy Anaphylaxis  . Silicone Dioxide [Silica] Anaphylaxis and Rash  . Ace Inhibitors Swelling    Lips swell  . Adhesive [Tape] Other (See Comments)    Redness  . Avelox [Moxifloxacin Hcl In Nacl] Other (See Comments)    Gastritis  . Chocolate Other (See  Comments)    Sneezing  . Cocoa Other (See Comments)    Sneezing  sneezing Sneezing  . Codeine Nausea Only and Other (See Comments)    GI upset  . Darvon [Propoxyphene] Other (See Comments)    GI Upset  . Fluorescein-Benoxinate Other (See Comments)    Eye irritation  . Hydrocodone-Acetaminophen Other (See Comments)    GI upset  . Lisinopril Swelling    Tongue and lips became swollen  . Simvastatin Other (See Comments)  Leg cramps  . Tetracyclines & Related Other (See Comments)    GI upset  . Bextra [Valdecoxib] Rash  . Biaxin [Clarithromycin] Rash  . Ceclor [Cefaclor] Rash  . Doxycycline Rash  . Erythromycin Rash  . Hibiclens [Chlorhexidine Gluconate] Rash  . Keflex [Cephalexin] Rash  . Melatonin Rash  . Penicillins Rash    Did it involve swelling of the face/tongue/throat, SOB, or low BP? Yes Did it involve sudden or severe rash/hives, skin peeling, or any reaction on the inside of your mouth or nose? No Did you need to seek medical attention at a hospital or doctor's office? No When did it last happen? "I was a kid" If all above answers are "NO", may proceed with cephalosporin use.   . Povidone-Iodine Rash       . Thimerosal Rash    Social History   Socioeconomic History  . Marital status: Married    Spouse name: Not on file  . Number of children: Not on file  . Years of education: Not on file  . Highest education level: Not on file  Occupational History  . Not on file  Social Needs  . Financial resource strain: Not on file  . Food insecurity    Worry: Not on file    Inability: Not on file  . Transportation needs    Medical: Not on file    Non-medical: Not on file  Tobacco Use  . Smoking status: Never Smoker  . Smokeless tobacco: Never Used  Substance and Sexual Activity  . Alcohol use: Not Currently  . Drug use: Never  . Sexual activity: Not on file  Lifestyle  . Physical activity    Days per week: Not on file    Minutes per session: Not on  file  . Stress: Not on file  Relationships  . Social Herbalist on phone: Not on file    Gets together: Not on file    Attends religious service: Not on file    Active member of club or organization: Not on file    Attends meetings of clubs or organizations: Not on file    Relationship status: Not on file  . Intimate partner violence    Fear of current or ex partner: Not on file    Emotionally abused: Not on file    Physically abused: Not on file    Forced sexual activity: Not on file  Other Topics Concern  . Not on file  Social History Narrative  . Not on file   Review of Systems: General: No chills, fever, night sweats or weight changes  Cardiovascular:  No chest pain, dyspnea on exertion, edema, orthopnea, paroxysmal nocturnal dyspnea Dermatological: No rash, lesions or masses Respiratory: No cough, dyspnea Urologic: No hematuria, dysuria Abdominal: No nausea, vomiting, diarrhea, bright red blood per rectum, melena, or hematemesis Neurologic: No visual changes, weakness, changes in mental status All other systems reviewed and are otherwise negative except as noted above.  Physical Exam: Vitals:   10/14/19 0757  BP: 136/74  Pulse: (!) 104  Weight: (!) 345 lb (156.5 kg)  Height: 5\' 6"  (1.676 m)   GEN- The patient is well appearing, alert and oriented x 3 today.   HEENT: normocephalic, atraumatic; sclera clear, conjunctiva pink; hearing intact; oropharynx clear; neck supple, no JVP Lymph- no cervical lymphadenopathy Lungs- Clear to ausculation bilaterally, normal work of breathing.  No wheezes, rales, rhonchi Heart- Regular rate and rhythm, no murmurs, rubs or gallops, PMI not  laterally displaced GI- Obese, soft, non-tender, non-distended, bowel sounds present, no hepatosplenomegaly Extremities- no clubbing, cyanosis, or edema; DP/PT/radial pulses 2+ bilaterally MS- no significant deformity or atrophy Skin- warm and dry, no rash or lesion Psych- euthymic mood,  full affect Neuro- strength and sensation are intact  EKG is not ordered. Personal review of EKG from 07/20/19 shows NSR at 92 bpm with  PVCs  Assessment and Plan:  1. PVCs Previously 25% on monitor Didn't tolerate flecainide. Increase mexiletine to 300 TID (Confirmed that she has been taking 250 mg TID as ordered on 07/20/2019) Will request recent labwork from her PCP.   2. HTN Continue current regimen  3. HLD Per primary/[facility]  Increase mexiletine to max dose as above. Plan to see Dr. Curt Bears in 3-4 months as scheduled (in for recall in January). Will request recent labs from PCP.   Shirley Friar, PA-C  10/14/19 7:59 AM

## 2019-10-14 ENCOUNTER — Ambulatory Visit (INDEPENDENT_AMBULATORY_CARE_PROVIDER_SITE_OTHER): Payer: PRIVATE HEALTH INSURANCE | Admitting: Student

## 2019-10-14 ENCOUNTER — Other Ambulatory Visit: Payer: Self-pay

## 2019-10-14 VITALS — BP 136/74 | HR 104 | Ht 66.0 in | Wt 345.0 lb

## 2019-10-14 DIAGNOSIS — E785 Hyperlipidemia, unspecified: Secondary | ICD-10-CM | POA: Diagnosis not present

## 2019-10-14 DIAGNOSIS — I10 Essential (primary) hypertension: Secondary | ICD-10-CM | POA: Diagnosis not present

## 2019-10-14 DIAGNOSIS — I493 Ventricular premature depolarization: Secondary | ICD-10-CM

## 2019-10-14 MED ORDER — MEXILETINE HCL 150 MG PO CAPS: 300.0000 mg | ORAL_CAPSULE | Freq: Three times a day (TID) | ORAL | 6 refills | Status: DC

## 2019-10-14 NOTE — Patient Instructions (Addendum)
Medication Instructions:  START TAKING   MEXILETINE 300 MG THREE TIMES A DAY    *If you need a refill on your cardiac medications before your next appointment, please call your pharmacy*  Lab Work: NONE ORDERED  TODAY   If you have labs (blood work) drawn today and your tests are completely normal, you will receive your results only by: [name] MyChart Message (if you have MyChart) OR . A paper copy in the mail If you have any lab test that is abnormal or we need to change your treatment, we will call you to review the results.  Testing/Procedures: NONE ORDERED  TODAY    Follow-Up: At [facility], you and your health needs are our priority.  As part of our continuing mission to provide you with exceptional heart care, we have created designated Provider Care Teams.  These Care Teams include your primary Cardiologist (physician) and Advanced Practice Providers (APPs -  Physician Assistants and Nurse Practitioners) who all work together to provide you with the care you need, when you need it.  Your next appointment:   3 months  The format for your next appointment:   In Person  Provider:   Allegra Lai, MD  Other Instructions

## 2020-01-17 ENCOUNTER — Other Ambulatory Visit: Payer: Self-pay

## 2020-01-17 ENCOUNTER — Ambulatory Visit (INDEPENDENT_AMBULATORY_CARE_PROVIDER_SITE_OTHER): Payer: PRIVATE HEALTH INSURANCE | Admitting: Cardiology

## 2020-01-17 ENCOUNTER — Encounter: Payer: Self-pay | Admitting: Cardiology

## 2020-01-17 VITALS — BP 120/74 | HR 106 | Ht 66.0 in | Wt 355.8 lb

## 2020-01-17 DIAGNOSIS — I493 Ventricular premature depolarization: Secondary | ICD-10-CM

## 2020-01-17 MED ORDER — MEXILETINE HCL 150 MG PO CAPS: 150.0000 mg | ORAL_CAPSULE | Freq: Two times a day (BID) | ORAL | 3 refills | Status: DC

## 2020-01-17 MED ORDER — DILTIAZEM HCL ER COATED BEADS 180 MG PO CP24: 180.0000 mg | ORAL_CAPSULE | Freq: Every day | ORAL | 3 refills | Status: DC

## 2020-01-17 NOTE — Progress Notes (Signed)
Electrophysiology Office Note   Date:  01/17/2020   ID:  Kelly Thornton, Kelly Thornton [birth year], MRN [id number]  PCP:  Lujean Amel, MD  Cardiologist:  Johnsie Cancel Primary Electrophysiologist:  Adnan Vanvoorhis Meredith Leeds, MD    No chief complaint on file.    History of Present Illness: Kelly MIREL Thornton is a 59 y.o. female who is being seen today for the evaluation of PVCs at the request of Koirala, Dibas, MD. Presenting today for electrophysiology evaluation.  She has a history significant for asthma, diabetes, hypertension, hyperlipidemia, morbid obesity, Sjogren's disease, and fibromyalgia.  She was referred to cardiology initially with PVCs.  She wore a cardiac monitor that showed 25% PVCs, and salvos of nonsustained VT with up to 12 beats.  She had an echocardiogram that showed a normal ejection fraction.  Today, denies symptoms of palpitations, chest pain, shortness of breath, orthopnea, PND, lower extremity edema, claudication, dizziness, presyncope, syncope, bleeding, or neurologic sequela. The patient is tolerating medications without difficulties.  Overall she is doing well.  She has noted no further episodes of PVCs.  She is tolerating mexiletine without issue.   Past Medical History:  Diagnosis Date  . Anxiety   . Arthritis   . Asthma   . Cough variant asthma 04/02/2018   FENO 04/02/2018  =   11 on advair 250 one bid - Spirometry 04/02/2018  FEV1 2.39 (84%)  Ratio 87 with truncation of peak flow p advair prior  - 04/02/2018  After extensive coaching inhaler device  effectiveness =    75% from a baseline of 50% so try symbicort 80  1-2 bid    . Depression   . Diabetes mellitus without complication ([place])   . Dyspnea   . Endometriosis   . Fibromyalgia   . Hyperlipidemia   . Hypertension   . Irritable bowel syndrome (IBS)   . Morbid obesity ([place])   . PONV (postoperative nausea and vomiting)   . Sjogren's disease ([place])   . Thyroid disease    Past Surgical History:  Procedure Laterality Date   . bladder stretching    . DIAGNOSTIC LAPAROSCOPY     x4  . EXCISION OF TONGUE LESION Left 10/02/2018   Procedure: EXCISION OF TONGUE LESION;  Surgeon: [name], MD;  Location: [facility];  Service: ENT;  Laterality: Left;  . EYE SURGERY    . NASAL SEPTUM SURGERY    . TONSILLECTOMY       Current Outpatient Medications  Medication Sig Dispense Refill  . albuterol (PROVENTIL HFA;VENTOLIN HFA) 108 (90 BASE) MCG/ACT inhaler Inhale 2 puffs into the lungs every 6 (six) hours as needed for wheezing or shortness of breath.    [name] albuterol (PROVENTIL) (2.5 MG/3ML) 0.083% nebulizer solution Take 2.5 mg by nebulization every 6 (six) hours as needed for wheezing or shortness of breath.     [name] amitriptyline (ELAVIL) 100 MG tablet Take 100 mg by mouth at bedtime.    . budesonide-formoterol (SYMBICORT) 160-4.5 MCG/ACT inhaler Inhale 2 puffs into the lungs 2 (two) times daily.    [name] buPROPion (WELLBUTRIN XL) 150 MG 24 hr tablet Take 150 mg by mouth daily.    . carboxymethylcellulose (REFRESH TEARS) 0.5 % SOLN Place 1 drop into both eyes 3 (three) times daily as needed (for dryness).     . cetirizine (ZYRTEC) 10 MG tablet Take 10 mg by mouth daily.    . Cholecalciferol (VITAMIN D) 2000 units tablet Take 2,000 Units by mouth daily.     [name]  diltiazem (CARDIZEM CD) 180 MG 24 hr capsule Take 1 capsule (180 mg total) by mouth daily. 90 capsule 3  . EPINEPHrine 0.3 mg/0.3 mL IJ SOAJ injection Inject 0.3 mg into the muscle as needed for anaphylaxis ("from seafood").     . Eyelid Cleansers (AVENOVA) 0.01 % SOLN Place 1 application into both eyes 2 (two) times daily.    . fluocinonide (LIDEX) 0.05 % external solution Apply 1-2 application topically daily. As directed    . fluticasone (FLONASE) 50 MCG/ACT nasal spray Place 2 sprays into both nostrils at bedtime.    . irbesartan (AVAPRO) 150 MG tablet Take 150 mg by mouth daily.     [name] levothyroxine (SYNTHROID, LEVOTHROID) 200 MCG tablet Take 200 mcg by mouth daily  before breakfast.     . Magnesium 400 MG TABS Take 400 mg by mouth daily.     . meclizine (ANTIVERT) 25 MG tablet Take 25 mg by mouth every 8 (eight) hours as needed for dizziness.     . metFORMIN (GLUCOPHAGE-XR) 500 MG 24 hr tablet Take 500 mg by mouth See admin instructions. Take 500 mg by mouth once a day at 4 PM    . mexiletine (MEXITIL) 150 MG capsule Take 1 capsule (150 mg total) by mouth 2 (two) times daily. 90 capsule 3  . Multiple Vitamin (MULTIVITAMIN) capsule Take 1 capsule by mouth daily.    [name] NASAL SALINE NA Place 2 sprays into both nostrils as needed (for dryness or congestion).     . pentosan polysulfate (ELMIRON) 100 MG capsule Take 200 mg by mouth 2 (two) times daily.    . rosuvastatin (CRESTOR) 5 MG tablet Take 5 mg by mouth at bedtime.     . SYMBICORT 80-4.5 MCG/ACT inhaler INHALE TWO PUFFS BY MOUTH TWICE A DAY 10.2 g 0  . triamterene-hydrochlorothiazide (MAXZIDE-25) 37.5-25 MG tablet Take 1 tablet by mouth daily.    . valsartan (DIOVAN) 160 MG tablet Take 160 mg by mouth daily.    [name] venlafaxine XR (EFFEXOR-XR) 150 MG 24 hr capsule Take 150 mg by mouth daily with breakfast.     No current facility-administered medications for this visit.    Allergies:   Isopropyl alcohol, Mineral oil, Monosodium glutamate, Nickel, Other, Pneumovax [pneumococcal polysaccharide vaccine], Procaine, Rubbing alcohol [alcohol], Shellfish allergy, Silicone dioxide [silica], Ace inhibitors, Adhesive [tape], Avelox [moxifloxacin hcl in nacl], Chocolate, Cocoa, Codeine, Darvon [propoxyphene], Fluorescein-benoxinate, Hydrocodone-acetaminophen, Lisinopril, Simvastatin, Tetracyclines & related, Bextra [valdecoxib], Biaxin [clarithromycin], Ceclor [cefaclor], Doxycycline, Erythromycin, Hibiclens [chlorhexidine gluconate], Keflex [cephalexin], Melatonin, Penicillins, Povidone-iodine, and Thimerosal   Social History:  The patient  reports that she has never smoked. She has never used smokeless tobacco. She  reports previous alcohol use. She reports that she does not use drugs.   Family History:  The patient's family history includes Cancer in an other family member; Heart disease in an other family member; Hyperlipidemia in an other family member; Hypertension in an other family member; Sleep apnea in an other family member.    ROS:  Please see the history of present illness.   Otherwise, review of systems is positive for none.   All other systems are reviewed and negative.   PHYSICAL EXAM: VS:  BP 120/74   Pulse (!) 106   Ht 5\' 6"  (1.676 m)   Wt (!) 355 lb 12.8 oz (161.4 kg)   SpO2 96%   BMI 57.43 kg/m  , BMI Body mass index is 57.43 kg/m. GEN: Well nourished, well developed, in no acute  distress  HEENT: normal  Neck: no JVD, carotid bruits, or masses Cardiac: RRR; no murmurs, rubs, or gallops,no edema  Respiratory:  clear to auscultation bilaterally, normal work of breathing GI: soft, nontender, nondistended, + BS MS: no deformity or atrophy  Skin: warm and dry Neuro:  Strength and sensation are intact Psych: euthymic mood, full affect  EKG:  EKG is ordered today. Personal review of the ekg ordered shows sinus rhythm, rate 97  Recent Labs: 02/05/2019: ALT 32; BUN 20; Creatinine, Ser 1.25; Hemoglobin 13.6; Platelets 191; Potassium 4.5; Sodium 138    Lipid Panel  No results found for: CHOL, TRIG, HDL, CHOLHDL, VLDL, LDLCALC, LDLDIRECT   Wt Readings from Last 3 Encounters:  01/17/20 (!) 355 lb 12.8 oz (161.4 kg)  10/14/19 (!) 345 lb (156.5 kg)  07/20/19 (!) 344 lb 9.6 oz (156.3 kg)      Other studies Reviewed: Additional studies/ records that were reviewed today include: TTE 01/16/18  Review of the above records today demonstrates:  - Left ventricle: The cavity size was normal. Wall thickness was   increased in a pattern of mild LVH. Systolic function was normal.   The estimated ejection fraction was in the range of 55% to 60%.   The study is not technically sufficient to  allow evaluation of LV   diastolic function.  Holter 01/04/18 - personally reviewed NSR Average HR 101 range 78-141 bpm Frequent PVCls and NSVT longest range 12 beats Ventricular ectopy 25% total beats Refer to EP for suppressive Rx   ASSESSMENT AND PLAN:  1.  PVCs: Previous burden 25%.  Currently on the mexiletine.  Did not tolerate flecainide in the past.  He is doing on her mexiletine feeling without complaints.  No changes.  2.  Hypertension: Currently well controlled  3.  Hyperlipidemia: Statin per primary cardiology   Current medicines are reviewed at length with the patient today.   The patient does not have concerns regarding her medicines.  The following changes were made today: None  Labs/ tests ordered today include:  Orders Placed This Encounter  Procedures  . EKG 12-Lead    Disposition:   FU with Macoy Rodwell 12 months  Signed, Keyuana Wank Meredith Leeds, MD  01/17/2020 4:21 PM     [place] [id number] [phone] (office) [phone] (fax)

## 2020-04-29 ENCOUNTER — Other Ambulatory Visit: Payer: Self-pay | Admitting: Internal Medicine

## 2020-05-26 DIAGNOSIS — H6060 Unspecified chronic otitis externa, unspecified ear: Secondary | ICD-10-CM | POA: Insufficient documentation

## 2020-06-01 ENCOUNTER — Other Ambulatory Visit: Payer: Self-pay | Admitting: Gastroenterology

## 2020-06-14 ENCOUNTER — Other Ambulatory Visit: Payer: Self-pay | Admitting: Cardiology

## 2020-08-07 ENCOUNTER — Other Ambulatory Visit: Payer: Self-pay | Admitting: Gastroenterology

## 2020-08-09 ENCOUNTER — Other Ambulatory Visit: Payer: Self-pay

## 2020-08-09 ENCOUNTER — Encounter (HOSPITAL_COMMUNITY): Payer: Self-pay | Admitting: Gastroenterology

## 2020-08-09 NOTE — Progress Notes (Signed)
Attempted to obtain medical history via telephone, unable to reach at this time. I left a voicemail to return pre surgical testing department's phone call.  

## 2020-08-11 ENCOUNTER — Other Ambulatory Visit (HOSPITAL_COMMUNITY)
Admission: RE | Admit: 2020-08-11 | Discharge: 2020-08-11 | Disposition: A | Discharge status: Home / Self Care | Payer: PRIVATE HEALTH INSURANCE | Source: Ambulatory Visit | Attending: Gastroenterology | Admitting: Gastroenterology

## 2020-08-11 DIAGNOSIS — Z01812 Encounter for preprocedural laboratory examination: Secondary | ICD-10-CM | POA: Diagnosis present

## 2020-08-11 DIAGNOSIS — Z20822 Contact with and (suspected) exposure to covid-19: Secondary | ICD-10-CM | POA: Insufficient documentation

## 2020-08-11 LAB — SARS CORONAVIRUS 2 (TAT 6-24 HRS): SARS Coronavirus 2: NEGATIVE

## 2020-08-15 ENCOUNTER — Encounter (HOSPITAL_COMMUNITY): Payer: Self-pay | Admitting: Gastroenterology

## 2020-08-15 ENCOUNTER — Ambulatory Visit (HOSPITAL_COMMUNITY)
Admission: RE | Admit: 2020-08-15 | Discharge: 2020-08-15 | Disposition: A | Discharge status: Home / Self Care | Payer: No Typology Code available for payment source | Attending: Gastroenterology | Admitting: Gastroenterology

## 2020-08-15 ENCOUNTER — Ambulatory Visit (HOSPITAL_COMMUNITY): Payer: No Typology Code available for payment source | Admitting: Anesthesiology

## 2020-08-15 ENCOUNTER — Other Ambulatory Visit: Payer: Self-pay

## 2020-08-15 ENCOUNTER — Encounter (HOSPITAL_COMMUNITY)
Admission: RE | Disposition: A | Discharge status: Home / Self Care | Payer: Self-pay | Source: Home / Self Care | Attending: Gastroenterology

## 2020-08-15 DIAGNOSIS — F329 Major depressive disorder, single episode, unspecified: Secondary | ICD-10-CM | POA: Insufficient documentation

## 2020-08-15 DIAGNOSIS — Z6841 Body Mass Index (BMI) 40.0 and over, adult: Secondary | ICD-10-CM | POA: Diagnosis not present

## 2020-08-15 DIAGNOSIS — E039 Hypothyroidism, unspecified: Secondary | ICD-10-CM | POA: Diagnosis not present

## 2020-08-15 DIAGNOSIS — K219 Gastro-esophageal reflux disease without esophagitis: Secondary | ICD-10-CM | POA: Insufficient documentation

## 2020-08-15 DIAGNOSIS — M797 Fibromyalgia: Secondary | ICD-10-CM | POA: Diagnosis not present

## 2020-08-15 DIAGNOSIS — I10 Essential (primary) hypertension: Secondary | ICD-10-CM | POA: Diagnosis not present

## 2020-08-15 DIAGNOSIS — K559 Vascular disorder of intestine, unspecified: Secondary | ICD-10-CM | POA: Diagnosis not present

## 2020-08-15 DIAGNOSIS — M19071 Primary osteoarthritis, right ankle and foot: Secondary | ICD-10-CM | POA: Diagnosis not present

## 2020-08-15 DIAGNOSIS — Z841 Family history of disorders of kidney and ureter: Secondary | ICD-10-CM | POA: Insufficient documentation

## 2020-08-15 DIAGNOSIS — M170 Bilateral primary osteoarthritis of knee: Secondary | ICD-10-CM | POA: Diagnosis not present

## 2020-08-15 DIAGNOSIS — G709 Myoneural disorder, unspecified: Secondary | ICD-10-CM | POA: Diagnosis not present

## 2020-08-15 DIAGNOSIS — I493 Ventricular premature depolarization: Secondary | ICD-10-CM | POA: Insufficient documentation

## 2020-08-15 DIAGNOSIS — M3500 Sicca syndrome, unspecified: Secondary | ICD-10-CM | POA: Diagnosis not present

## 2020-08-15 DIAGNOSIS — Z91048 Other nonmedicinal substance allergy status: Secondary | ICD-10-CM | POA: Insufficient documentation

## 2020-08-15 DIAGNOSIS — Z883 Allergy status to other anti-infective agents status: Secondary | ICD-10-CM | POA: Insufficient documentation

## 2020-08-15 DIAGNOSIS — Z880 Allergy status to penicillin: Secondary | ICD-10-CM | POA: Insufficient documentation

## 2020-08-15 DIAGNOSIS — M47817 Spondylosis without myelopathy or radiculopathy, lumbosacral region: Secondary | ICD-10-CM | POA: Diagnosis not present

## 2020-08-15 DIAGNOSIS — K640 First degree hemorrhoids: Secondary | ICD-10-CM | POA: Diagnosis not present

## 2020-08-15 DIAGNOSIS — E669 Obesity, unspecified: Secondary | ICD-10-CM | POA: Diagnosis not present

## 2020-08-15 DIAGNOSIS — K589 Irritable bowel syndrome without diarrhea: Secondary | ICD-10-CM | POA: Diagnosis not present

## 2020-08-15 DIAGNOSIS — Z885 Allergy status to narcotic agent status: Secondary | ICD-10-CM | POA: Insufficient documentation

## 2020-08-15 DIAGNOSIS — Z888 Allergy status to other drugs, medicaments and biological substances status: Secondary | ICD-10-CM | POA: Insufficient documentation

## 2020-08-15 DIAGNOSIS — M160 Bilateral primary osteoarthritis of hip: Secondary | ICD-10-CM | POA: Diagnosis not present

## 2020-08-15 DIAGNOSIS — J45909 Unspecified asthma, uncomplicated: Secondary | ICD-10-CM | POA: Insufficient documentation

## 2020-08-15 DIAGNOSIS — M19072 Primary osteoarthritis, left ankle and foot: Secondary | ICD-10-CM | POA: Insufficient documentation

## 2020-08-15 DIAGNOSIS — E7800 Pure hypercholesterolemia, unspecified: Secondary | ICD-10-CM | POA: Insufficient documentation

## 2020-08-15 DIAGNOSIS — Z803 Family history of malignant neoplasm of breast: Secondary | ICD-10-CM | POA: Insufficient documentation

## 2020-08-15 DIAGNOSIS — Z1211 Encounter for screening for malignant neoplasm of colon: Secondary | ICD-10-CM | POA: Diagnosis present

## 2020-08-15 DIAGNOSIS — F419 Anxiety disorder, unspecified: Secondary | ICD-10-CM | POA: Diagnosis not present

## 2020-08-15 DIAGNOSIS — Z8249 Family history of ischemic heart disease and other diseases of the circulatory system: Secondary | ICD-10-CM | POA: Insufficient documentation

## 2020-08-15 DIAGNOSIS — Z818 Family history of other mental and behavioral disorders: Secondary | ICD-10-CM | POA: Insufficient documentation

## 2020-08-15 DIAGNOSIS — M1990 Unspecified osteoarthritis, unspecified site: Secondary | ICD-10-CM | POA: Diagnosis not present

## 2020-08-15 DIAGNOSIS — Z91013 Allergy to seafood: Secondary | ICD-10-CM | POA: Insufficient documentation

## 2020-08-15 DIAGNOSIS — Z881 Allergy status to other antibiotic agents status: Secondary | ICD-10-CM | POA: Insufficient documentation

## 2020-08-15 DIAGNOSIS — Z884 Allergy status to anesthetic agent status: Secondary | ICD-10-CM | POA: Insufficient documentation

## 2020-08-15 HISTORY — DX: Meniere's disease, unspecified ear: H81.09

## 2020-08-15 HISTORY — DX: Unspecified cataract: H26.9

## 2020-08-15 HISTORY — DX: Gastro-esophageal reflux disease without esophagitis: K21.9

## 2020-08-15 HISTORY — DX: Other seasonal allergic rhinitis: J30.2

## 2020-08-15 HISTORY — DX: Interstitial cystitis (chronic) without hematuria: N30.10

## 2020-08-15 HISTORY — PX: BIOPSY: SHX5522

## 2020-08-15 HISTORY — DX: Ventricular premature depolarization: I49.3

## 2020-08-15 HISTORY — PX: COLONOSCOPY WITH PROPOFOL: SHX5780

## 2020-08-15 HISTORY — DX: Hypothyroidism, unspecified: E03.9

## 2020-08-15 HISTORY — DX: Other ventricular tachycardia: I47.29

## 2020-08-15 HISTORY — DX: Unspecified hearing loss, unspecified ear: H91.90

## 2020-08-15 HISTORY — DX: Ventricular tachycardia: I47.2

## 2020-08-15 HISTORY — DX: Presence of external hearing-aid: Z97.4

## 2020-08-15 LAB — GLUCOSE, CAPILLARY: Glucose-Capillary: 128 mg/dL — ABNORMAL HIGH (ref 70–99)

## 2020-08-15 SURGERY — COLONOSCOPY WITH PROPOFOL
Anesthesia: Monitor Anesthesia Care

## 2020-08-15 MED ORDER — PROPOFOL 1000 MG/100ML IV EMUL
INTRAVENOUS | Status: AC
  Filled 2020-08-15: qty 100

## 2020-08-15 MED ORDER — LACTATED RINGERS IV SOLN: INTRAVENOUS | Status: DC | PRN

## 2020-08-15 MED ORDER — PROPOFOL 500 MG/50ML IV EMUL
INTRAVENOUS | Status: AC
  Filled 2020-08-15: qty 50

## 2020-08-15 MED ORDER — SODIUM CHLORIDE 0.9 % IV SOLN: INTRAVENOUS | Status: DC

## 2020-08-15 MED ORDER — PHENYLEPHRINE 40 MCG/ML (10ML) SYRINGE FOR IV PUSH (FOR BLOOD PRESSURE SUPPORT)
PREFILLED_SYRINGE | INTRAVENOUS | Status: DC | PRN
  Administered 2020-08-15 (×7): 80 ug via INTRAVENOUS

## 2020-08-15 MED ORDER — PROPOFOL 1000 MG/100ML IV EMUL
INTRAVENOUS | Status: AC
  Filled 2020-08-15: qty 200

## 2020-08-15 MED ORDER — PROPOFOL 10 MG/ML IV BOLUS
INTRAVENOUS | Status: DC | PRN
  Administered 2020-08-15: 30 mg via INTRAVENOUS

## 2020-08-15 MED ORDER — PROPOFOL 500 MG/50ML IV EMUL
INTRAVENOUS | Status: DC | PRN
  Administered 2020-08-15: 150 ug/kg/min via INTRAVENOUS

## 2020-08-15 MED ORDER — LIDOCAINE 2% (20 MG/ML) 5 ML SYRINGE
INTRAMUSCULAR | Status: DC | PRN
  Administered 2020-08-15: 60 mg via INTRAVENOUS

## 2020-08-15 MED ORDER — LACTATED RINGERS IV SOLN
INTRAVENOUS | Status: DC
  Administered 2020-08-15: 1000 mL via INTRAVENOUS

## 2020-08-15 SURGICAL SUPPLY — 21 items

## 2020-08-15 NOTE — Anesthesia Preprocedure Evaluation (Addendum)
Anesthesia Evaluation  Patient identified by MRN, date of birth, ID band Patient awake    Reviewed: Allergy & Precautions, NPO status , Patient's Chart, lab work & pertinent test results  History of Anesthesia Complications (+) PONV and history of anesthetic complications  Airway Mallampati: III  TM Distance: >3 FB Neck ROM: Full    Dental  (+) Teeth Intact, Dental Advisory Given   Pulmonary asthma ,    breath sounds clear to auscultation       Cardiovascular hypertension,  Rhythm:Regular Rate:Normal     Neuro/Psych PSYCHIATRIC DISORDERS Anxiety Depression  Neuromuscular disease    GI/Hepatic Neg liver ROS, GERD  ,  Endo/Other  diabetesHypothyroidism   Renal/GU negative Renal ROS     Musculoskeletal  (+) Arthritis , Fibromyalgia -  Abdominal (+) + obese,   Peds  Hematology   Anesthesia Other Findings   Reproductive/Obstetrics                            Anesthesia Physical Anesthesia Plan  ASA: III  Anesthesia Plan: MAC   Post-op Pain Management:    Induction: Intravenous  PONV Risk Score and Plan: 0 and Propofol infusion  Airway Management Planned: Natural Airway and Simple Face Mask  Additional Equipment: None  Intra-op Plan:   Post-operative Plan:   Informed Consent: I have reviewed the patients History and Physical, chart, labs and discussed the procedure including the risks, benefits and alternatives for the proposed anesthesia with the patient or authorized representative who has indicated his/her understanding and acceptance.       Plan Discussed with: CRNA  Anesthesia Plan Comments: (EKG: normal sinus rhythm. )       Anesthesia Quick Evaluation

## 2020-08-15 NOTE — Anesthesia Postprocedure Evaluation (Signed)
Anesthesia Post Note  Patient: Kelly Thornton  Procedure(s) Performed: COLONOSCOPY WITH PROPOFOL (N/A ) BIOPSY     Patient location during evaluation: PACU Anesthesia Type: MAC Level of consciousness: awake and alert Pain management: pain level controlled Vital Signs Assessment: post-procedure vital signs reviewed and stable Respiratory status: spontaneous breathing, nonlabored ventilation, respiratory function stable and patient connected to nasal cannula oxygen Cardiovascular status: stable and blood pressure returned to baseline Postop Assessment: no apparent nausea or vomiting Anesthetic complications: no   No complications documented.  Last Vitals:  Vitals:   08/15/20 0950 08/15/20 1000  BP: 124/66 140/78  Pulse: (!) 103 91  Resp: 14 13  Temp:    SpO2: 100% 100%    Last Pain:  Vitals:   08/15/20 1000  TempSrc:   PainSc: 0-No pain                 Effie Berkshire

## 2020-08-15 NOTE — Transfer of Care (Signed)
Immediate Anesthesia Transfer of Care Note  Patient: Kelly Thornton  Procedure(s) Performed: COLONOSCOPY WITH PROPOFOL (N/A ) BIOPSY  Patient Location: Endoscopy Unit  Anesthesia Type:MAC  Level of Consciousness: awake, alert , oriented and patient cooperative  Airway & Oxygen Therapy: Patient Spontanous Breathing and Patient connected to face mask oxygen  Post-op Assessment: Report given to RN, Post -op Vital signs reviewed and stable and Patient moving all extremities  Post vital signs: Reviewed and stable  Last Vitals:  Vitals Value Taken Time  BP 113/61 08/15/20 0941  Temp    Pulse 101 08/15/20 0941  Resp 14 08/15/20 0942  SpO2 94 % 08/15/20 0941  Vitals shown include unvalidated device data.  Last Pain:  Vitals:   08/15/20 0746  TempSrc: Oral  PainSc: 0-No pain         Complications: No complications documented.

## 2020-08-15 NOTE — Op Note (Signed)
[facility] Patient Name: Kelly Thornton Procedure Date: 08/15/2020 MRN: [id number] Attending MD: Lear Ng , MD Date of Birth: Jul 18, 1961 CSN: [id number] Age: 59 Admit Type: Outpatient Procedure:                Colonoscopy Indications:              Screening for colorectal malignant neoplasm, Last                            colonoscopy: December 2010 Providers:                Lear Ng, MD, Cleda Daub, RN, Cherylynn Ridges, Technician, Caryl Pina CRNA Referring MD:             Lujean Amel, MD Medicines:                Propofol per Anesthesia, Monitored Anesthesia Care Complications:            No immediate complications. Estimated Blood Loss:     Estimated blood loss: none. Procedure:                Pre-Anesthesia Assessment:                           - Prior to the procedure, a History and Physical                            was performed, and patient medications and                            allergies were reviewed. The patient's tolerance of                            previous anesthesia was also reviewed. The risks                            and benefits of the procedure and the sedation                            options and risks were discussed with the patient.                            All questions were answered, and informed consent                            was obtained. Prior Anticoagulants: The patient has                            taken no previous anticoagulant or antiplatelet                            agents. ASA Grade Assessment: III - A patient with  severe systemic disease. After reviewing the risks                            and benefits, the patient was deemed in                            satisfactory condition to undergo the procedure.                           After obtaining informed consent, the colonoscope                            was passed under direct  vision. Throughout the                            procedure, the patient's blood pressure, pulse, and                            oxygen saturations were monitored continuously. The                            [id number]-[id number] ([id number] Olympus pediatric colonscope                            was introduced through the anus and advanced to the                            the cecum, identified by appendiceal orifice and                            ileocecal valve. The colonoscopy was performed                            without difficulty. The patient tolerated the                            procedure well. The quality of the bowel                            preparation was fair. The ileocecal valve,                            appendiceal orifice, and rectum were photographed. Scope In: 9:02:53 AM Scope Out: 9:34:44 AM Scope Withdrawal Time: 0 hours 20 minutes 27 seconds  Total Procedure Duration: 0 hours 31 minutes 51 seconds  Findings:      The perianal and digital rectal examinations were normal.      A segmental area of moderately congested, erythematous, inflamed and       vascular-pattern-decreased mucosa was found in the sigmoid colon.       Biopsies were taken with a cold forceps for histology. Estimated blood       loss was minimal.      Internal hemorrhoids were found during retroflexion. The hemorrhoids       were small and Grade I (internal hemorrhoids that do  not prolapse).      The exam was otherwise normal throughout the examined colon. Impression:               - Preparation of the colon was fair.                           - Congested, erythematous, inflamed and                            vascular-pattern-decreased mucosa in the sigmoid                            colon. Biopsied.                           - Internal hemorrhoids. Moderate Sedation:      Not Applicable - Patient had care per Anesthesia. Recommendation:           - Patient has a contact number available for                             emergencies. The signs and symptoms of potential                            delayed complications were discussed with the                            patient. Return to normal activities tomorrow.                            Written discharge instructions were provided to the                            patient.                           - High fiber diet.                           - Await pathology results.                           - Repeat colonoscopy for surveillance based on                            pathology results. Procedure Code(s):        --- Professional ---                           [phone], Colonoscopy, flexible; with biopsy, single                            or multiple Diagnosis Code(s):        --- Professional ---                           Z12.11, Encounter for screening for malignant  neoplasm of colon                           K52.9, Noninfective gastroenteritis and colitis,                            unspecified                           K64.0, First degree hemorrhoids CPT copyright 2019 American Medical Association. All rights reserved. The codes documented in this report are preliminary and upon coder review may  be revised to meet current compliance requirements. Lear Ng, MD 08/15/2020 9:44:36 AM This report has been signed electronically. Number of Addenda: 0

## 2020-08-15 NOTE — Interval H&P Note (Signed)
History and Physical Interval Note:  08/15/2020 8:50 AM  Kelly Thornton  has presented today for surgery, with the diagnosis of Screening.  The various methods of treatment have been discussed with the patient and family. After consideration of risks, benefits and other options for treatment, the patient has consented to  Procedure(s): COLONOSCOPY WITH PROPOFOL (N/A) as a surgical intervention.  The patient's history has been reviewed, patient examined, no change in status, stable for surgery.  I have reviewed the patient's chart and labs.  Questions were answered to the patient's satisfaction.     Lear Ng

## 2020-08-15 NOTE — Discharge Instructions (Signed)

## 2020-08-15 NOTE — H&P (Signed)
Date of Initial H&P: 08/04/20  History reviewed, patient examined, no change in status, stable for surgery.

## 2020-08-16 ENCOUNTER — Encounter (HOSPITAL_COMMUNITY): Payer: Self-pay | Admitting: Gastroenterology

## 2020-08-16 LAB — SURGICAL PATHOLOGY

## 2021-01-10 ENCOUNTER — Other Ambulatory Visit: Payer: Self-pay

## 2021-01-10 MED ORDER — DILTIAZEM HCL ER COATED BEADS 180 MG PO CP24: 180.0000 mg | ORAL_CAPSULE | Freq: Every day | ORAL | 0 refills | Status: DC

## 2021-03-20 ENCOUNTER — Other Ambulatory Visit: Payer: Self-pay

## 2021-03-20 ENCOUNTER — Encounter: Payer: Self-pay | Admitting: Cardiology

## 2021-03-20 ENCOUNTER — Ambulatory Visit (INDEPENDENT_AMBULATORY_CARE_PROVIDER_SITE_OTHER): Payer: PRIVATE HEALTH INSURANCE | Admitting: Cardiology

## 2021-03-20 VITALS — BP 138/64 | HR 112 | Ht 66.5 in | Wt 367.0 lb

## 2021-03-20 DIAGNOSIS — I493 Ventricular premature depolarization: Secondary | ICD-10-CM

## 2021-03-20 DIAGNOSIS — Z79899 Other long term (current) drug therapy: Secondary | ICD-10-CM | POA: Diagnosis not present

## 2021-03-20 DIAGNOSIS — I10 Essential (primary) hypertension: Secondary | ICD-10-CM

## 2021-03-20 MED ORDER — CARVEDILOL 12.5 MG PO TABS: 12.5000 mg | ORAL_TABLET | Freq: Two times a day (BID) | ORAL | 6 refills | Status: DC

## 2021-03-20 MED ORDER — IRBESARTAN 300 MG PO TABS: 300.0000 mg | ORAL_TABLET | Freq: Every day | ORAL | 1 refills | Status: DC

## 2021-03-20 NOTE — Progress Notes (Signed)
Electrophysiology Office Note   Date:  03/20/2021   ID:  Kelly, Thornton 07/19/1961, MRN [id number]  PCP:  Lujean Amel, MD  Cardiologist:  Johnsie Cancel Primary Electrophysiologist:  Cashmere Dingley Meredith Leeds, MD    No chief complaint on file.    History of Present Illness: Kelly Thornton is a 60 y.o. female who is being seen today for the evaluation of PVCs at the request of Koirala, Dibas, MD. Presenting today for electrophysiology evaluation.    She has a history significant for asthma, diabetes, hypertension, hyperlipidemia, morbid obesity, Sjogren's syndrome, and fibromyalgia.  She was initially referred to cardiology clinic with PVCs.  She was found to have a 25% burden and episodes of nonsustained VT for up to 12 beats.  She had an echo that showed normal ejection fraction.  She was initially put on flecainide but did not tolerate the medication.  She is now on mexiletine with improved symptoms.  Today, denies symptoms of palpitations, chest pain, shortness of breath, orthopnea, PND, lower extremity edema, claudication, dizziness, presyncope, syncope, bleeding, or neurologic sequela. The patient is tolerating medications without difficulties.  Overall she is feeling well.  She has no chest pain or shortness of breath.  She has no awareness of her PVCs.  Unfortunately she states that her blood pressure has been elevated at home, generally in the mid 140s.  At times it is in the 130s and also goes up into the 160s.  She would like improved control of her blood pressure.   Past Medical History:  Diagnosis Date  . Anxiety   . Arthritis   . Asthma   . Cataract   . Chronic interstitial cystitis   . Cough variant asthma 04/02/2018   FENO 04/02/2018  =   11 on advair 250 one bid - Spirometry 04/02/2018  FEV1 2.39 (84%)  Ratio 87 with truncation of peak flow p advair prior  - 04/02/2018  After extensive coaching inhaler device  effectiveness =    75% from a baseline of 50% so try symbicort 80   1-2 bid    . Depression   . Diabetes mellitus without complication ([place])   . Dyspnea   . Endometriosis   . Fibromyalgia   . GERD (gastroesophageal reflux disease)   . Hearing loss   . Hyperlipidemia   . Hypertension   . Hypothyroidism   . Irritable bowel syndrome (IBS)   . Meniere disease    Bilateral  . Morbid obesity ([place])   . Nonsustained ventricular tachycardia ([place])   . PONV (postoperative nausea and vomiting)   . Premature ventricular contractions (PVCs) ([facility])   . Seasonal allergies   . Sjogren's disease ([place])   . Thyroid disease   . Wears hearing aid in both ears    Past Surgical History:  Procedure Laterality Date  . BIOPSY  08/15/2020   Procedure: BIOPSY;  Surgeon: Wilford Corner, MD;  Location: [facility];  Service: Endoscopy;;  . bladder stretching    . COLONOSCOPY WITH PROPOFOL N/A 08/15/2020   Procedure: COLONOSCOPY WITH PROPOFOL;  Surgeon: Wilford Corner, MD;  Location: [facility];  Service: Endoscopy;  Laterality: N/A;  . CYSTOSCOPY    . DIAGNOSTIC LAPAROSCOPY     x4  . DILATION AND CURETTAGE OF UTERUS    . ENDOMETRIAL BIOPSY    . EXCISION OF TONGUE LESION Left 10/02/2018   Procedure: EXCISION OF TONGUE LESION;  Surgeon: [name], MD;  Location: [place];  Service: ENT;  Laterality: Left;  .  EYE SURGERY    . NASAL SEPTUM SURGERY    . STAPEDECTOMY    . TONSILLECTOMY AND ADENOIDECTOMY    . TUBAL LIGATION    . WISDOM TOOTH EXTRACTION       Current Outpatient Medications  Medication Sig Dispense Refill  . albuterol (PROVENTIL HFA;VENTOLIN HFA) 108 (90 BASE) MCG/ACT inhaler Inhale 2 puffs into the lungs every 6 (six) hours as needed for wheezing or shortness of breath.    [name] albuterol (PROVENTIL) (2.5 MG/3ML) 0.083% nebulizer solution Take 2.5 mg by nebulization every 6 (six) hours as needed for wheezing or shortness of breath.     [name] amitriptyline (ELAVIL) 100 MG tablet Take 100 mg by mouth at bedtime.    [name] buPROPion (WELLBUTRIN XL) 150 MG 24  hr tablet Take 150 mg by mouth daily.    . carvedilol (COREG) 12.5 MG tablet Take 1 tablet (12.5 mg total) by mouth 2 (two) times daily. 60 tablet 6  . cetirizine (ZYRTEC) 10 MG tablet Take 10 mg by mouth daily.    . Cholecalciferol (VITAMIN D) 2000 units tablet Take 2,000 Units by mouth daily.     [name] EPINEPHrine 0.3 mg/0.3 mL IJ SOAJ injection Inject 0.3 mg into the muscle as needed for anaphylaxis ("from seafood").     . Eyelid Cleansers (AVENOVA) 0.01 % SOLN Place 1 application into both eyes 2 (two) times daily.    . fluocinonide (LIDEX) 0.05 % external solution Apply 1-2 application topically daily as needed (Scalp). As directed    . fluticasone (FLONASE) 50 MCG/ACT nasal spray Place 2 sprays into both nostrils at bedtime.    . irbesartan (AVAPRO) 300 MG tablet Take 1 tablet (300 mg total) by mouth daily. 90 tablet 1  . levothyroxine (SYNTHROID, LEVOTHROID) 200 MCG tablet Take 200 mcg by mouth daily before breakfast.     . Magnesium 400 MG TABS Take 400 mg by mouth daily.     . metFORMIN (GLUCOPHAGE-XR) 500 MG 24 hr tablet Take 500 mg by mouth at bedtime.     . mexiletine (MEXITIL) 150 MG capsule TAKE 1 CAPSULE BY MOUTH  TWICE DAILY (Patient taking differently: Take 150 mg by mouth 2 (two) times daily.) 180 capsule 3  . Multiple Vitamin (MULTIVITAMIN) capsule Take 1 capsule by mouth daily.    [name] NASAL SALINE NA Place 2 sprays into both nostrils daily as needed (for dryness or congestion).     . pentosan polysulfate (ELMIRON) 100 MG capsule Take 200 mg by mouth 2 (two) times daily.    . rosuvastatin (CRESTOR) 5 MG tablet Take 5 mg by mouth at bedtime.     . SYMBICORT 80-4.5 MCG/ACT inhaler INHALE TWO PUFFS BY MOUTH TWICE A DAY (Patient taking differently: Inhale 2 puffs into the lungs in the morning and at bedtime.) 10.2 g 11  . triamterene-hydrochlorothiazide (MAXZIDE-25) 37.5-25 MG tablet Take 1 tablet by mouth daily.    . valsartan (DIOVAN) 160 MG tablet Take 160 mg by mouth daily.    [name]  venlafaxine XR (EFFEXOR-XR) 150 MG 24 hr capsule Take 150 mg by mouth daily with breakfast.     No current facility-administered medications for this visit.    Allergies:   Isopropyl alcohol, Mineral oil, Monosodium glutamate, Nickel, Other, Pneumovax [pneumococcal polysaccharide vaccine], Procaine, Rubbing alcohol [alcohol], Shellfish allergy, Silicone dioxide [silica], Ace inhibitors, Adhesive [tape], Avelox [moxifloxacin hcl in nacl], Chocolate, Cocoa, Codeine, Darvon [propoxyphene], Fluorescein-benoxinate, Hydrocodone-acetaminophen, Lisinopril, Simvastatin, Tetracyclines & related, Bextra [valdecoxib], Biaxin [clarithromycin], Ceclor [cefaclor], Doxycycline, Erythromycin,  Hibiclens [chlorhexidine gluconate], Keflex [cephalexin], Penicillins, Povidone-iodine, and Thimerosal   Social History:  The patient  reports that she has never smoked. She has never used smokeless tobacco. She reports previous alcohol use. She reports that she does not use drugs.   Family History:  The patient's family history includes Cancer in an other family member; Heart disease in an other family member; Hyperlipidemia in an other family member; Hypertension in an other family member; Sleep apnea in an other family member.   ROS:  Please see the history of present illness.   Otherwise, review of systems is positive for none.   All other systems are reviewed and negative.   PHYSICAL EXAM: VS:  BP 138/64   Pulse (!) 112   Ht 5' 6.5" (1.689 m)   Wt (!) 367 lb (166.5 kg)   BMI 58.35 kg/m  , BMI Body mass index is 58.35 kg/m. GEN: Well nourished, well developed, in no acute distress  HEENT: normal  Neck: no JVD, carotid bruits, or masses Cardiac: RRR; no murmurs, rubs, or gallops,no edema  Respiratory:  clear to auscultation bilaterally, normal work of breathing GI: soft, nontender, nondistended, + BS MS: no deformity or atrophy  Skin: warm and dry Neuro:  Strength and sensation are intact Psych: euthymic mood,  full affect  EKG:  EKG is ordered today. Personal review of the ekg ordered shows sinus rhythm, PVCs, rate 112  Recent Labs: No results found for requested labs within last 8760 hours.    Lipid Panel  No results found for: CHOL, TRIG, HDL, CHOLHDL, VLDL, LDLCALC, LDLDIRECT   Wt Readings from Last 3 Encounters:  03/20/21 (!) 367 lb (166.5 kg)  08/15/20 (!) 370 lb 1.4 oz (167.9 kg)  01/17/20 (!) 355 lb 12.8 oz (161.4 kg)      Other studies Reviewed: Additional studies/ records that were reviewed today include: TTE 01/16/18  Review of the above records today demonstrates:  - Left ventricle: The cavity size was normal. Wall thickness was   increased in a pattern of mild LVH. Systolic function was normal.   The estimated ejection fraction was in the range of 55% to 60%.   The study is not technically sufficient to allow evaluation of LV   diastolic function.  Holter 01/04/18 - personally reviewed NSR Average HR 101 range 78-141 bpm Frequent PVCls and NSVT longest range 12 beats Ventricular ectopy 25% total beats Refer to EP for suppressive Rx   ASSESSMENT AND PLAN:  1.  PVCs: Previous burden of 25%.  Currently on mexiletine.  Did not tolerate flecainide.  High risk medication monitoring.  She has a few PVCs on her ECG today, though her burden is significantly reduced.  We Juliahna Wiswell continue with current management.  2.  Hypertension: Blood pressure has been significantly elevated at home, averaging in the 140s.  Due to that, we Joffrey Kerce increase her Avapro to 300 mg, stop diltiazem, and start carvedilol 12.5 mg.  She has also had issues with sinus tachycardia.  3.  Hyperlipidemia: Continue statin per primary cardiology   Current medicines are reviewed at length with the patient today.   The patient does not have concerns regarding her medicines.  The following changes were made today: Increase irbesartan, stop diltiazem, start carvedilol  Labs/ tests ordered today include:  Orders  Placed This Encounter  Procedures  . Basic metabolic panel  . EKG 12-Lead    Disposition:   FU with Jerri Hargadon 6 months  Signed, Maryruth Apple Meredith Leeds, MD  03/20/2021 8:40 AM     [facility] [address] [place] [address] [phone] (office) [phone] (fax)

## 2021-03-20 NOTE — Patient Instructions (Addendum)
Medication Instructions:  Your physician has recommended you make the following change in your medication:  1. STOP Diltiazem 2. START Carvedilol 12.5 mg TWICE a day 3. INCREASE Irbesartan to 300 mg daily  *If you need a refill on your cardiac medications before your next appointment, please call your pharmacy*   Lab Work: BMET in a couple of weeks.  Send me a MyChart message and let me know what day you will stop by in 1-2 weeks.  If you have labs (blood work) drawn today and your tests are completely normal, you will receive your results only by: [name] MyChart Message (if you have MyChart) OR . A paper copy in the mail If you have any lab test that is abnormal or we need to change your treatment, we will call you to review the results.   Testing/Procedures: None ordered   Follow-Up: At [facility], you and your health needs are our priority.  As part of our continuing mission to provide you with exceptional heart care, we have created designated Provider Care Teams.  These Care Teams include your primary Cardiologist (physician) and Advanced Practice Providers (APPs -  Physician Assistants and Nurse Practitioners) who all work together to provide you with the care you need, when you need it.  Your next appointment:   6 month(s)  The format for your next appointment:   In Person  Provider:   You may see  one of the following Advanced Practice Providers on your designated Care Team:    Chanetta Marshall, NP  Tommye Standard, PA-C  Legrand Como "[place]" [place], [place]     Thank you for choosing [facility]!!   Trinidad Curet, RN [phone]   Other Instructions  Carvedilol Tablets What is this medicine? CARVEDILOL (KAR ve dil ol) is a beta blocker. It decreases the amount of work your heart has to do and helps your heart beat regularly. It treats high blood pressure. This medicine may be used for other purposes; ask your health care provider or pharmacist if you have  questions. COMMON BRAND NAME(S): Coreg What should I tell my health care provider before I take this medicine? They need to know if you have any of these conditions:  circulation problems  diabetes  history of heart attack or heart disease  liver disease  lung or breathing disease, like asthma or emphysema  pheochromocytoma  slow or irregular heartbeat  thyroid disease  an unusual or allergic reaction to carvedilol, other beta-blockers, medicines, foods, dyes, or preservatives  pregnant or trying to get pregnant  breast-feeding How should I use this medicine? Take this drug by mouth. Take it as directed on the prescription label at the same time every day. Take it with food. Keep taking it unless your health care provider tells you to stop. Talk to your health care provider about the use of this drug in children. Special care may be needed. Overdosage: If you think you have taken too much of this medicine contact a poison control center or emergency room at once. NOTE: This medicine is only for you. Do not share this medicine with others. What if I miss a dose? If you miss a dose, take it as soon as you can. If it is almost time for your next dose, take only that dose. Do not take double or extra doses. What may interact with this medicine? This medicine may interact with the following medications:  certain medicines for blood pressure, heart disease, irregular heart beat  certain  medicines for depression, like fluoxetine or paroxetine  certain medicines for diabetes, like glipizide or glyburide  cimetidine  clonidine  cyclosporine  digoxin  MAOIs like Carbex, Eldepryl, Marplan, Nardil, and Parnate  reserpine  rifampin This list may not describe all possible interactions. Give your health care provider a list of all the medicines, herbs, non-prescription drugs, or dietary supplements you use. Also tell them if you smoke, drink alcohol, or use illegal drugs. Some  items may interact with your medicine. What should I watch for while using this medicine? Check your heart rate and blood pressure regularly while you are taking this medicine. Ask your doctor or health care professional what your heart rate and blood pressure should be, and when you should contact him or her. Do not stop taking this medicine suddenly. This could lead to serious heart-related effects. Contact your doctor or health care professional if you have difficulty breathing while taking this drug. Check your weight daily. Ask your doctor or health care professional when you should notify him/her of any weight gain. You may get drowsy or dizzy. Do not drive, use machinery, or do anything that requires mental alertness until you know how this medicine affects you. To reduce the risk of dizzy or fainting spells, do not sit or stand up quickly. Alcohol can make you more drowsy, and increase flushing and rapid heartbeats. Avoid alcoholic drinks. This medicine may increase blood sugar. Ask your healthcare provider if changes in diet or medicines are needed if you have diabetes. If you are going to have surgery, tell your doctor or health care professional that you are taking this medicine. What side effects may I notice from receiving this medicine? Side effects that you should report to your doctor or health care professional as soon as possible:  allergic reactions like skin rash, itching or hives, swelling of the face, lips, or tongue  breathing problems  dark urine  irregular heartbeat   signs and symptoms of high blood sugar such as being more thirsty or hungry or having to urinate more than normal. You may also feel very tired or have blurry vision.  swollen legs or ankles  vomiting  yellowing of the eyes or skin Side effects that usually do not require medical attention (report to your doctor or health care professional if they continue or are bothersome):  change in sex drive or  performance  diarrhea  dry eyes (especially if wearing contact lenses)  dry, itching skin  headache  nausea  unusually tired This list may not describe all possible side effects. Call your doctor for medical advice about side effects. You may report side effects to FDA at 1-800-FDA-1088. Where should I keep my medicine? Keep out of the reach of children and pets. Store at room temperature between 20 and 25 degrees C (68 and 77 degrees F). Protect from moisture. Keep the container tightly closed. Throw away any unused drug after the expiration date. NOTE: This sheet is a summary. It may not cover all possible information. If you have questions about this medicine, talk to your doctor, pharmacist, or health care provider.  2021 Elsevier/Gold Standard (2019-07-16 17:42:09)

## 2021-03-27 ENCOUNTER — Other Ambulatory Visit: Payer: Self-pay

## 2021-03-27 ENCOUNTER — Ambulatory Visit (INDEPENDENT_AMBULATORY_CARE_PROVIDER_SITE_OTHER): Payer: PRIVATE HEALTH INSURANCE | Admitting: Physician Assistant

## 2021-03-27 ENCOUNTER — Encounter: Payer: Self-pay | Admitting: Physician Assistant

## 2021-03-27 DIAGNOSIS — L738 Other specified follicular disorders: Secondary | ICD-10-CM

## 2021-03-27 DIAGNOSIS — L219 Seborrheic dermatitis, unspecified: Secondary | ICD-10-CM | POA: Diagnosis not present

## 2021-03-27 DIAGNOSIS — L659 Nonscarring hair loss, unspecified: Secondary | ICD-10-CM

## 2021-03-27 MED ORDER — ALCLOMETASONE DIPROPIONATE 0.05 % EX CREA: TOPICAL_CREAM | Freq: Two times a day (BID) | CUTANEOUS | 3 refills | Status: DC | PRN

## 2021-03-27 MED ORDER — KETOCONAZOLE 2 % EX CREA: 1.0000 "application " | TOPICAL_CREAM | Freq: Two times a day (BID) | CUTANEOUS | 10 refills | Status: AC

## 2021-03-27 MED ORDER — KETOCONAZOLE 2 % EX SHAM: MEDICATED_SHAMPOO | CUTANEOUS | 9 refills | Status: DC

## 2021-03-27 NOTE — Progress Notes (Signed)
   Follow-Up Visit   Subjective  Kelly Thornton is a 60 y.o. female who presents for the following: Hair/Scalp Problem (Bald patches in scalp rash like areas for over 6  months, patient stated she had all 3 covid shots but this started before injections. No home changes per patient no medication chagnes per patient No known previous bald patches per patient. Patches are frontal and crown of scalp- mostly thinning. No history of PCOS or menstrual cycle abnormalities. She experienced Menopause at 60 years old.  Forehead and Ears crusty and itchy for 3 months or more. No treatment attempted. Chronic and flaring significantly at this time.   No history of melanoma or nonmole skin CA per chart review.    The following portions of the chart were reviewed this encounter and updated as appropriate:  Negative ROS, reviewed chart for significant medical and surgical history.     Objective  Well appearing patient in no apparent distress; mood and affect are within normal limits.  A focused examination was performed including head, including the scalp, face, neck, nose, ears, eyelids, and lips. Relevant physical exam findings are noted in the Assessment and Plan.  Objective  Mid Forehead and scalp: Erythematous plaques with greasy scale.   Objective  entire upper face: Yellowish papules with central dell.   Objective  Mid Frontal Scalp, ears and forehead: Diffuse thinning mostly frontal hairline and crown. Negative hair pull. No frank baldness. 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 Ok to leave if stable.  Alopecia Mid Frontal Scalp, ears and forehead  Over the counter- Rogaine 5% for  men & biotin vitamin   I, Cia Garretson, PA-C, have reviewed all documentation's for this visit.  The documentation on 03/27/21 for the exam, diagnosis, procedures and orders are all accurate and complete.

## 2021-03-27 NOTE — Patient Instructions (Signed)
Over the Counter- Rogaine for men- use every night at bedtime Also can take over the counter Biotin

## 2021-03-29 ENCOUNTER — Other Ambulatory Visit: Payer: Self-pay

## 2021-03-29 ENCOUNTER — Other Ambulatory Visit: Payer: PRIVATE HEALTH INSURANCE

## 2021-03-29 DIAGNOSIS — I10 Essential (primary) hypertension: Secondary | ICD-10-CM

## 2021-03-29 DIAGNOSIS — Z79899 Other long term (current) drug therapy: Secondary | ICD-10-CM

## 2021-03-30 LAB — BASIC METABOLIC PANEL
BUN/Creatinine Ratio: 20 (ref 12–28)
BUN: 22 mg/dL (ref 8–27)
CO2: 23 mmol/L (ref 20–29)
Calcium: 9.5 mg/dL (ref 8.7–10.3)
Chloride: 99 mmol/L (ref 96–106)
Creatinine, Ser: 1.12 mg/dL — ABNORMAL HIGH (ref 0.57–1.00)
Glucose: 94 mg/dL (ref 65–99)
Potassium: 4.8 mmol/L (ref 3.5–5.2)
Sodium: 141 mmol/L (ref 134–144)
eGFR: 56 mL/min/{1.73_m2} — ABNORMAL LOW (ref >59)

## 2021-07-09 MED ORDER — MEXILETINE HCL 150 MG PO CAPS: 150.0000 mg | ORAL_CAPSULE | Freq: Two times a day (BID) | ORAL | 0 refills | Status: DC

## 2021-08-15 ENCOUNTER — Other Ambulatory Visit: Payer: Self-pay | Admitting: Cardiology

## 2021-08-15 DIAGNOSIS — I10 Essential (primary) hypertension: Secondary | ICD-10-CM

## 2021-08-20 ENCOUNTER — Other Ambulatory Visit: Payer: Self-pay | Admitting: Neurology

## 2021-08-20 DIAGNOSIS — I10 Essential (primary) hypertension: Secondary | ICD-10-CM

## 2021-08-20 MED ORDER — CARVEDILOL 12.5 MG PO TABS: 12.5000 mg | ORAL_TABLET | Freq: Two times a day (BID) | ORAL | 5 refills | Status: DC

## 2021-09-04 ENCOUNTER — Ambulatory Visit (INDEPENDENT_AMBULATORY_CARE_PROVIDER_SITE_OTHER): Payer: No Typology Code available for payment source | Admitting: Cardiology

## 2021-09-04 ENCOUNTER — Encounter: Payer: Self-pay | Admitting: Cardiology

## 2021-09-04 ENCOUNTER — Other Ambulatory Visit: Payer: Self-pay

## 2021-09-04 VITALS — BP 122/68 | HR 101 | Ht 66.5 in | Wt 352.4 lb

## 2021-09-04 DIAGNOSIS — I493 Ventricular premature depolarization: Secondary | ICD-10-CM | POA: Diagnosis not present

## 2021-09-04 NOTE — Progress Notes (Signed)
Electrophysiology Office Note   Date:  09/04/2021   ID:  Kelly Thornton, Kelly Thornton [birth year], MRN [id number]  PCP:  Lujean Amel, MD  Cardiologist:  Johnsie Cancel Primary Electrophysiologist:  Bairon Klemann Meredith Leeds, MD    No chief complaint on file.    History of Present Illness: Kelly Thornton is a 60 y.o. female who is being seen today for the evaluation of PVCs at the request of Koirala, Dibas, MD. Presenting today for electrophysiology evaluation.    She has a history significant for asthma, diabetes, hypertension, hyperlipidemia, morbid obesity, Sjogren's syndrome, and fibromyalgia.  She was referred to cardiology clinic for PVCs.  She wore a cardiac monitor with a 25% burden and episodes of nonsustained VT for up to 12 beats.  She had an echo that showed a normal ejection fraction.  She was initially started on flecainide but not tolerate the medication.  She is now on mexiletine with improved symptoms.  Today, denies symptoms of palpitations, chest pain, shortness of breath, orthopnea, PND, lower extremity edema, claudication, dizziness, presyncope, syncope, bleeding, or neurologic sequela. The patient is tolerating medications without difficulties.  Since being seen she has done well.  She has no chest pain or shortness of breath.  She notes no further episodes of palpitations.  She is comfortable with her mexiletine.  She does bring in blood pressure results that are elevated, in the 140s to 150s most of the time.  Her blood pressure today in clinic is normal.   Past Medical History:  Diagnosis Date   Anxiety    Arthritis    Asthma    Cataract    Chronic interstitial cystitis    Cough variant asthma 04/02/2018   FENO 04/02/2018  =   11 on advair 250 one bid - Spirometry 04/02/2018  FEV1 2.39 (84%)  Ratio 87 with truncation of peak flow p advair prior  - 04/02/2018  After extensive coaching inhaler device  effectiveness =    75% from a baseline of 50% so try symbicort 80  1-2 bid      Depression    Diabetes mellitus without complication (HCC)    Dyspnea    Endometriosis    Fibromyalgia    GERD (gastroesophageal reflux disease)    Hearing loss    Hyperlipidemia    Hypertension    Hypothyroidism    Irritable bowel syndrome (IBS)    Meniere disease    Bilateral   Morbid obesity ([place])    Nonsustained ventricular tachycardia (HCC)    PONV (postoperative nausea and vomiting)    Premature ventricular contractions (PVCs) (VPCs)    Seasonal allergies    Sjogren's disease ([place])    Thyroid disease    Wears hearing aid in both ears    Past Surgical History:  Procedure Laterality Date   BIOPSY  08/15/2020   Procedure: BIOPSY;  Surgeon: Wilford Corner, MD;  Location: [facility];  Service: Endoscopy;;   bladder stretching     COLONOSCOPY WITH PROPOFOL N/A 08/15/2020   Procedure: COLONOSCOPY WITH PROPOFOL;  Surgeon: Wilford Corner, MD;  Location: [facility];  Service: Endoscopy;  Laterality: N/A;   CYSTOSCOPY     DIAGNOSTIC LAPAROSCOPY     x4   DILATION AND CURETTAGE OF UTERUS     ENDOMETRIAL BIOPSY     EXCISION OF TONGUE LESION Left 10/02/2018   Procedure: EXCISION OF TONGUE LESION;  Surgeon: [name], MD;  Location: [facility];  Service: ENT;  Laterality: Left;   EYE SURGERY  NASAL SEPTUM SURGERY     STAPEDECTOMY     TONSILLECTOMY AND ADENOIDECTOMY     TUBAL LIGATION     WISDOM TOOTH EXTRACTION       Current Outpatient Medications  Medication Sig Dispense Refill   albuterol (PROVENTIL HFA;VENTOLIN HFA) 108 (90 BASE) MCG/ACT inhaler Inhale 2 puffs into the lungs every 6 (six) hours as needed for wheezing or shortness of breath.     alclomethasone (ACLOVATE) 0.05 % cream Apply topically 2 (two) times daily as needed (Rash). 180 g 3   amitriptyline (ELAVIL) 100 MG tablet Take 100 mg by mouth at bedtime.     buPROPion (WELLBUTRIN XL) 150 MG 24 hr tablet Take 150 mg by mouth daily.     carvedilol (COREG) 12.5 MG tablet Take 1 tablet (12.5 mg total) by  mouth 2 (two) times daily. 60 tablet 5   cetirizine (ZYRTEC) 10 MG tablet Take 10 mg by mouth daily.     Cholecalciferol (VITAMIN D) 2000 units tablet Take 2,000 Units by mouth daily.      EPINEPHrine 0.3 mg/0.3 mL IJ SOAJ injection Inject 0.3 mg into the muscle as needed for anaphylaxis ("from seafood").      Eyelid Cleansers (AVENOVA) 0.01 % SOLN Place 1 application into both eyes 2 (two) times daily.     fluocinonide (LIDEX) 0.05 % external solution Apply 1-2 application topically daily as needed (Scalp). As directed     fluticasone (FLONASE) 50 MCG/ACT nasal spray Place 2 sprays into both nostrils at bedtime.     ketoconazole (NIZORAL) 2 % shampoo Let sit 3-5 minutes before rinsing 120 mL 9   levothyroxine (SYNTHROID, LEVOTHROID) 200 MCG tablet Take 200 mcg by mouth daily before breakfast.      metFORMIN (GLUCOPHAGE-XR) 500 MG 24 hr tablet Take 500 mg by mouth at bedtime.      mexiletine (MEXITIL) 150 MG capsule Take 1 capsule (150 mg total) by mouth 2 (two) times daily. 180 capsule 0   Multiple Vitamin (MULTIVITAMIN) capsule Take 1 capsule by mouth daily.     NASAL SALINE NA Place 2 sprays into both nostrils daily as needed (for dryness or congestion).      pentosan polysulfate (ELMIRON) 100 MG capsule Take 200 mg by mouth 2 (two) times daily.     rosuvastatin (CRESTOR) 5 MG tablet Take 5 mg by mouth at bedtime.      SYMBICORT 80-4.5 MCG/ACT inhaler INHALE TWO PUFFS BY MOUTH TWICE A DAY (Patient taking differently: Inhale 2 puffs into the lungs in the morning and at bedtime.) 10.2 g 11   triamterene-hydrochlorothiazide (MAXZIDE-25) 37.5-25 MG tablet Take 1 tablet by mouth daily.     valsartan (DIOVAN) 160 MG tablet Take 160 mg by mouth daily.     venlafaxine XR (EFFEXOR-XR) 150 MG 24 hr capsule Take 150 mg by mouth daily with breakfast.     irbesartan (AVAPRO) 300 MG tablet TAKE 1 TABLET BY MOUTH  DAILY (Patient not taking: Reported on 09/04/2021) 90 tablet 1   Magnesium 400 MG TABS Take 400  mg by mouth daily.  (Patient not taking: Reported on 09/04/2021)     No current facility-administered medications for this visit.    Allergies:   Isopropyl alcohol, Mineral oil, Monosodium glutamate, Nickel, Other, Pneumovax [pneumococcal polysaccharide vaccine], Procaine, Rubbing alcohol [alcohol], Shellfish allergy, Silicone dioxide [silica], Ace inhibitors, Adhesive [tape], Avelox [moxifloxacin hcl in nacl], Chocolate, Cocoa, Codeine, Darvon [propoxyphene], Fluorescein-benoxinate, Hydrocodone-acetaminophen, Lisinopril, Simvastatin, Tetracyclines & related, Bextra [valdecoxib], Biaxin [clarithromycin], Ceclor [cefaclor],  Doxycycline, Erythromycin, Hibiclens [chlorhexidine gluconate], Keflex [cephalexin], Penicillins, Povidone-iodine, and Thimerosal   Social History:  The patient  reports that she has never smoked. She has never used smokeless tobacco. She reports that she does not currently use alcohol. She reports that she does not use drugs.   Family History:  The patient's family history includes Cancer in an other family member; Heart disease in an other family member; Hyperlipidemia in an other family member; Hypertension in an other family member; Sleep apnea in an other family member.   ROS:  Please see the history of present illness.   Otherwise, review of systems is positive for none.   All other systems are reviewed and negative.   PHYSICAL EXAM: VS:  BP 122/68   Pulse (!) 101   Ht 5' 6.5" (1.689 m)   Wt (!) 352 lb 6.4 oz (159.8 kg)   SpO2 92%   BMI 56.03 kg/m  , BMI Body mass index is 56.03 kg/m. GEN: Well nourished, well developed, in no acute distress  HEENT: normal  Neck: no JVD, carotid bruits, or masses Cardiac: RRR; no murmurs, rubs, or gallops,no edema  Respiratory:  clear to auscultation bilaterally, normal work of breathing GI: soft, nontender, nondistended, + BS MS: no deformity or atrophy  Skin: warm and dry Neuro:  Strength and sensation are intact Psych:  euthymic mood, full affect  EKG:  EKG is ordered today. Personal review of the ekg ordered shows sinus rhythm, PVCs, rate 101  Recent Labs: 03/29/2021: BUN 22; Creatinine, Ser 1.12; Potassium 4.8; Sodium 141    Lipid Panel  No results found for: CHOL, TRIG, HDL, CHOLHDL, VLDL, LDLCALC, LDLDIRECT   Wt Readings from Last 3 Encounters:  09/04/21 (!) 352 lb 6.4 oz (159.8 kg)  03/20/21 (!) 367 lb (166.5 kg)  08/15/20 (!) 370 lb 1.4 oz (167.9 kg)      Other studies Reviewed: Additional studies/ records that were reviewed today include: TTE 01/16/18  Review of the above records today demonstrates:  - Left ventricle: The cavity size was normal. Wall thickness was   increased in a pattern of mild LVH. Systolic function was normal.   The estimated ejection fraction was in the range of 55% to 60%.   The study is not technically sufficient to allow evaluation of LV   diastolic function.  Holter 01/04/18 - personally reviewed NSR Average HR 101 range 78-141 bpm Frequent PVCls and NSVT longest range 12 beats Ventricular ectopy 25% total beats Refer to EP for suppressive Rx   ASSESSMENT AND PLAN:  1.  PVCs: Burden of 25%.  Currently on mexiletine 150 mg twice daily.  She did not tolerate flecainide.  High risk medication monitoring via ECG.  Burden is significantly reduced.  We Zandon Talton continue with current management.  2.  Hypertension: Currently on carvedilol 12.5 mg twice daily, valsartan 160 mg daily.  Blood pressure is currently well controlled.  She brings in elevated blood pressures from home.  I am concerned that she may not have the right size blood pressure cuff.  She Terez Freimark bring her blood pressure cuff into her doctor's office the next time she comes in to compare.  3.  Hyperlipidemia: Continue Crestor 5 mg per primary care.  Current medicines are reviewed at length with the patient today.   The patient does not have concerns regarding her medicines.  The following changes were made  today: None  Labs/ tests ordered today include:  Orders Placed This Encounter  Procedures  EKG 12-Lead     Disposition:   FU with Philo Kurtz 6 months  Signed, Abbigayle Toole Meredith Leeds, MD  09/04/2021 8:13 AM     [facility] [address] [address] [phone] (office) [phone] (fax)

## 2021-09-04 NOTE — Patient Instructions (Signed)
Medication Instructions:  °Your physician recommends that you continue on your current medications as directed. Please refer to the Current Medication list given to you today. ° °*If you need a refill on your cardiac medications before your next appointment, please call your pharmacy* ° ° °Lab Work: °None ordered ° ° °Testing/Procedures: °None ordered ° ° °Follow-Up: °At [facility], you and your health needs are our priority.  As part of our continuing mission to provide you with exceptional heart care, we have created designated Provider Care Teams.  These Care Teams include your primary Cardiologist (physician) and Advanced Practice Providers (APPs -  Physician Assistants and Nurse Practitioners) who all work together to provide you with the care you need, when you need it. ° °Your next appointment:   °6 month(s) ° °The format for your next appointment:   °In Person ° °Provider:   °Will Camnitz, MD ° ° ° °Thank you for choosing [facility]!! ° ° °Mayra Brahm, RN °[phone] °  °

## 2021-09-18 ENCOUNTER — Other Ambulatory Visit: Payer: Self-pay | Admitting: Cardiology

## 2021-10-28 ENCOUNTER — Other Ambulatory Visit: Payer: Self-pay | Admitting: Internal Medicine

## 2021-11-06 ENCOUNTER — Encounter: Payer: Self-pay | Admitting: Internal Medicine

## 2021-11-06 ENCOUNTER — Ambulatory Visit (INDEPENDENT_AMBULATORY_CARE_PROVIDER_SITE_OTHER): Payer: No Typology Code available for payment source | Admitting: Internal Medicine

## 2021-11-06 ENCOUNTER — Other Ambulatory Visit: Payer: Self-pay

## 2021-11-06 DIAGNOSIS — J45991 Cough variant asthma: Secondary | ICD-10-CM | POA: Diagnosis not present

## 2021-11-06 MED ORDER — BUDESONIDE-FORMOTEROL FUMARATE 80-4.5 MCG/ACT IN AERO
INHALATION_SPRAY | RESPIRATORY_TRACT | 11 refills | Status: DC
Start: 2021-11-06 — End: 2022-02-28

## 2021-11-06 MED ORDER — AZITHROMYCIN 250 MG PO TABS: ORAL_TABLET | ORAL | 0 refills | Status: DC

## 2021-11-06 MED ORDER — PREDNISONE 10 MG PO TABS: ORAL_TABLET | ORAL | 0 refills | Status: DC

## 2021-11-06 NOTE — Progress Notes (Signed)
Subjective:     Patient ID: Kelly Thornton, female   DOB: 01-09-1961,     MRN: [id number]    Brief patient profile:  60  yowf never smoker works as Medical laboratory scientific officer  for [employer] with sinus problems at age 60 = unable to breath thru nose but subsequently pattern of freq sinusitis developed req for abx  rx up to 4 x per year then around age 2 developed more of a chronic asthma pattern on advair since around 2001 and on it ever since and starting in Sept 2018 more doe so referred to pulmonary clinic 04/02/2018 by Dr  [name]    Steven's eval dust, ragweed  Luna Fuse for Sjogren's    History of Present Illness  04/02/2018 1st [facility] office visit/ Kelly Thornton   Chief Complaint  Patient presents with   Pulmonary Consult    Referred by Dr. [name]. Pt states she was dxed with Asthma in 1990. She has had increased SOB and wheezing x 8 months.   new onset sob assoc with throat clearing  X sept 2018 / ok sleeping on side  Also chronic dry mouth secondary to Sjogren's Using rescue inhaler once a month now avg  twice daily / worse with lysol exp  Doe = MMRC3 = can't walk 100 yards even at a slow pace at a flat grade s stopping due to sob rec Stop advair  Start Symbicort 80 Take 1  puffs first thing in am and then another 1 puffs about 12 hours later Work on inhaler technique:  Only use your albuterol as a rescue medication GERD diet    05/19/2018  f/u ov/Kelly Thornton re: asthma f/u  Chief Complaint  Patient presents with   Follow-up    Increased cough and SOB since 05/03/18. She is coughing up minimal green sputum. She states she gets SOB walking approx 8 steps.   She is using her albuterol inhaler 6 x per day on average and neb about once per wk.   more HB since early may then started cough / sob 05/03/18     And up to 6 x daily saba where previously still several times daily despite symb 80 2bid also assoc nasal congestion  Doe = MMRC3 = can't walk 100 yards even at a slow pace at a flat grade s  stopping due to sob   Sleeps ok one pillow  rec Symbicort 160 Take 2 puffs first thing in am and then another 2 puffs about 12 hours later and if throat clearing gets worse go back to the 80 strength Work on inhaler technique:   Pantoprazole (protonix) 40 mg   Take  30-60 min before first meal of the day and Pepcid (famotidine)  20 mg one @  bedtime until return to office - this is the best way to tell whether stomach acid is contributing to your problem.  If not better pednisone 10 mg take  4 each am x 2 days,   2 each am x 2 days,  1 each am x 2 days and stop      08/26/2018  f/u ov/Kelly Thornton re: asthma  Chief Complaint  Patient presents with   Follow-up    Pt states she is about the same since last visit. States she is having to use her rescue inhaler twice a day. Pt has c/o cough which is nonproductive, SOB, and chest tightness.  Dyspnea:  Can do HT shopping but needs HC parking / knee limits her from doing  steps   Cough: still some throat clearing Sleeping: ok one pillow  SABA use: twice daily   Once around 4 pm walking to car/ in am too but not clear why  02: none  rec Next refill change to symbicort 80 Take 2 puffs first thing in am and then another 2 puffs about 12 hours later.  Work on inhaler technique:   Please schedule a follow up visit in 3 months but call sooner if needed  - add spacer next ov if not better       12/04/2018  f/u ov/Kelly Thornton re: asthma/ rhinitis  "Kelly Thornton gave up on me"/ varies symb 80 use  Chief Complaint  Patient presents with   Follow-up    cough is better, breathing is better, wheezing about the same, O2 levels around upper 90's, lower 90's    Dyspnea:  MMRC3 = can't walk 100 yards even at a slow pace at a flat grade s stopping due to sob / both knees hurts lowest sat 94% Cough: never noct/ work > home Sleeping: on side / 1 pillow  SABA use: twice a week while symb 80 one bid  02: none   Rec Symbicort 80 or 160  can be used up to 2 pffs every  12hours  Or tapered down to zero but monitor your albuterol use and use restart the symbicort if albuterol use goes up  [facility] to use beta blockers but my preference use of bisoprolol   F/u prn       11/06/2021  Re-establish ov/Kelly Thornton re: asthma/rhinitis well controlled on prn symbicort one bid then ran out Oct 1st 2022 first day of covid symptom Oct 19 2021   rx neb prn s specific rx while still on coreg  12.5 mg bid  Chief Complaint  Patient presents with   Acute Visit    Day 18 since tested positive for covid.  Chest congestion with green mucous.  Out of Symbicort x 3 weeks.   Dyspnea:  MMRC1 = can walk nl pace, flat grade, can't hurry or go uphills or steps s sob   Cough: congested cough / nasal congestion on saline Asencion Islam /  slt green tinged mucus assoc with nasal congestion Sleeping: L side down one pillow flat bed  SABA use: twice daily  02: none Covid status:   vax x 3    No obvious day to day or daytime variability or assoc  mucus plugs or hemoptysis or cp or chest tightness, subjective wheeze or overt sinus or hb symptoms.     Also denies any obvious fluctuation of symptoms with weather or environmental changes or other aggravating or alleviating factors except as outlined above   No unusual exposure hx or h/o childhood pna/ asthma or knowledge of premature birth.  Current Allergies, Complete Past Medical History, Past Surgical History, Family History, and Social History were reviewed in [employer] record.  ROS  The following are not active complaints unless bolded Hoarseness, sore throat, dysphagia, dental problems, itching, sneezing,  nasal congestion or discharge of excess mucus or purulent secretions, ear ache,   fever, chills, sweats, unintended wt loss or wt gain, classically pleuritic or exertional cp,  orthopnea pnd or arm/hand swelling  or leg swelling, presyncope, palpitations, abdominal pain, anorexia, nausea, vomiting, diarrhea  or change in bowel  habits or change in bladder habits, change in stools or change in urine, dysuria, hematuria,  rash, arthralgias, visual complaints, headache, numbness, weakness or ataxia or problems with  walking or coordination,  change in mood or  memory.        Current Meds  Medication Sig   albuterol (PROVENTIL HFA;VENTOLIN HFA) 108 (90 BASE) MCG/ACT inhaler Inhale 2 puffs into the lungs every 6 (six) hours as needed for wheezing or shortness of breath.   alclomethasone (ACLOVATE) 0.05 % cream Apply topically 2 (two) times daily as needed (Rash).   amitriptyline (ELAVIL) 100 MG tablet Take 100 mg by mouth at bedtime.   buPROPion (WELLBUTRIN XL) 150 MG 24 hr tablet Take 150 mg by mouth daily.   carvedilol (COREG) 12.5 MG tablet Take 1 tablet (12.5 mg total) by mouth 2 (two) times daily.   cetirizine (ZYRTEC) 10 MG tablet Take 10 mg by mouth daily.   Cholecalciferol (VITAMIN D) 2000 units tablet Take 2,000 Units by mouth daily.    EPINEPHrine 0.3 mg/0.3 mL IJ SOAJ injection Inject 0.3 mg into the muscle as needed for anaphylaxis ("from seafood").    Eyelid Cleansers (AVENOVA) 0.01 % SOLN Place 1 application into both eyes 2 (two) times daily.   fluocinonide (LIDEX) 0.05 % external solution Apply 1-2 application topically daily as needed (Scalp). As directed   fluticasone (FLONASE) 50 MCG/ACT nasal spray Place 2 sprays into both nostrils at bedtime.   ketoconazole (NIZORAL) 2 % shampoo Let sit 3-5 minutes before rinsing   levothyroxine (SYNTHROID, LEVOTHROID) 200 MCG tablet Take 200 mcg by mouth daily before breakfast.    metFORMIN (GLUCOPHAGE-XR) 500 MG 24 hr tablet Take 500 mg by mouth at bedtime.    mexiletine (MEXITIL) 150 MG capsule TAKE 1 CAPSULE BY MOUTH  TWICE DAILY   Multiple Vitamin (MULTIVITAMIN) capsule Take 1 capsule by mouth daily.   NASAL SALINE NA Place 2 sprays into both nostrils daily as needed (for dryness or congestion).    pentosan polysulfate (ELMIRON) 100 MG capsule Take 200 mg by mouth  2 (two) times daily.   rosuvastatin (CRESTOR) 5 MG tablet Take 5 mg by mouth at bedtime.    triamterene-hydrochlorothiazide (MAXZIDE-25) 37.5-25 MG tablet Take 1 tablet by mouth daily.   valsartan (DIOVAN) 160 MG tablet Take 160 mg by mouth daily.   venlafaxine XR (EFFEXOR-XR) 150 MG 24 hr capsule Take 150 mg by mouth daily with breakfast.             Objective:   Physical Exam      11/06/2021       338  12/04/2018      367  08/26/2018          376  05/19/2018         376   04/02/18 (!) 381 lb (172.8 kg)  06/14/14 (!) 353 lb 14.4 oz (160.5 kg)       Vital signs reviewed  11/06/2021  - Note at rest 02 sats  97% on RA    General appearance:    amb obese wf / freq throat clearing   HEENT : pt wearing mask not removed for exam due to covid -19 concerns.    NECK :  without JVD/Nodes/TM/ nl carotid upstrokes bilaterally   LUNGS: no acc muscle use,  Nl contour chest with distant min insp /exp rhonchi  bilaterally without cough on insp or exp maneuvers   CV:  RRR  no s3 or murmur or increase in P2, and no edema   ABD:  quite obese soft and nontender with  limited inspiratory excursion  No bruits or organomegaly appreciated, bowel sounds nl  MS:  Nl gait/ ext warm without deformities, calf tenderness, cyanosis or clubbing No obvious joint restrictions   SKIN: warm and dry without lesions    NEURO:  alert, approp, nl sensorium with  no motor or cerebellar deficits apparent.           Assessment:

## 2021-11-06 NOTE — Patient Instructions (Signed)
For cough > mucinex dm 1200 mg every 12hours and if still can't stop coughing > Try prilosec otc 20mg   Take 30-60 min before first meal of the day and Pepcid ac (famotidine) 20 mg one @  bedtime until cough is completely gone for at least a week without the need for cough suppression    Zpak   Prednisone 10 mg take  4 each am x 2 days,   2 each am x 2 days,  1 each am x 2 days and stop   Resume symbicort 80 Take 2 puffs first thing in am and then another 2 puffs about 12 hours later and wean after a week   Please schedule a follow up visit in 12 months but call sooner if needed

## 2021-11-07 ENCOUNTER — Encounter: Payer: Self-pay | Admitting: Internal Medicine

## 2021-11-07 NOTE — Assessment & Plan Note (Addendum)
Onset around age 60 with background of recurrent sinus infections FENO 04/02/2018  =   11 on advair 250 one bid - Spirometry 04/02/2018  FEV1 2.39 (84%)  Ratio 87 with truncation of peak flow p advair prior  - 04/02/2018   try symbicort 80  1-2 bid   - PFT's  05/19/2018  FEV1 2.60 (90 % ) ratio 91  p 17 % improvement from saba p nothing prior to study(since symb 80 x 2 x  12h prior ) with DLCO  81 % corrects to 122  % for alv volume   - 05/19/2018  After extensive coaching inhaler device  effectiveness =    75% from a baseline of 50%  - Allergy profile 05/19/2018 >  Eos 0.0 /  IgE  < 2 RAST neg  - 08/26/2018  After extensive coaching inhaler device,  effectiveness =  75% (short ti)    - FENO 08/26/2018  =   13 p symb 160 > changed to 80 2bid due to hoarseness/throat clearing   Flared with covid at the same time ran out of symbicort 80 with good longterm control prior (despite ongoing use of coreg) so no no need to change rx   rec Prednisone 10 mg take  4 each am x 2 days,   2 each am x 2 days,  1 each am x 2 days and stop  zpak  Resume   symbicort 89 2bid for at least a week before taper to prn Based on two studies from [place]  378; 20 p 1865 (2018) and 380 : p2020-30 (2019) in pts with mild asthma it is reasonable to use low dose symbicort eg 80 2bid "prn" flare in this setting but I emphasized this was only shown with symbicort and takes advantage of the rapid onset of action but is not the same as "rescue therapy" but can be stopped once the acute symptoms have resolved and the need for rescue has been minimized (< 2 x weekly)    F/u yearly for refills, call sooner if needed.          Each maintenance medication was reviewed in detail including emphasizing most importantly the difference between maintenance and prns and under what circumstances the prns are to be triggered using an action plan format where appropriate.  Total time for H and P, chart review, counseling, reviewing hfa device(s) and  generating customized AVS unique to this acute  office visit / same day charting =  30 min

## 2021-11-07 NOTE — Assessment & Plan Note (Signed)
Body mass index is 53.83 kg/m.  -  trending down/ encouraged No results found for: TSH    Contributing to doe and risk of GERD >>>   reviewed the need and the process to achieve and maintain neg calorie balance > defer f/u primary care including intermittently monitoring thyroid status

## 2021-12-23 HISTORY — PX: HAMMER TOE SURGERY: SHX385

## 2022-01-25 ENCOUNTER — Encounter: Payer: Self-pay | Admitting: Cardiology

## 2022-01-25 DIAGNOSIS — I10 Essential (primary) hypertension: Secondary | ICD-10-CM

## 2022-01-25 MED ORDER — CARVEDILOL 12.5 MG PO TABS: 12.5000 mg | ORAL_TABLET | Freq: Two times a day (BID) | ORAL | 2 refills | Status: DC

## 2022-02-14 ENCOUNTER — Other Ambulatory Visit: Payer: Self-pay

## 2022-02-14 DIAGNOSIS — I10 Essential (primary) hypertension: Secondary | ICD-10-CM

## 2022-02-14 MED ORDER — CARVEDILOL 12.5 MG PO TABS: 12.5000 mg | ORAL_TABLET | Freq: Two times a day (BID) | ORAL | 2 refills | Status: DC

## 2022-02-25 ENCOUNTER — Encounter: Payer: Self-pay | Admitting: Cardiology

## 2022-02-28 ENCOUNTER — Other Ambulatory Visit: Payer: Self-pay

## 2022-02-28 ENCOUNTER — Encounter: Payer: Self-pay | Admitting: Cardiology

## 2022-02-28 ENCOUNTER — Ambulatory Visit (INDEPENDENT_AMBULATORY_CARE_PROVIDER_SITE_OTHER): Payer: No Typology Code available for payment source | Admitting: Cardiology

## 2022-02-28 VITALS — BP 126/74 | HR 82 | Ht 66.5 in | Wt 334.0 lb

## 2022-02-28 DIAGNOSIS — I493 Ventricular premature depolarization: Secondary | ICD-10-CM | POA: Diagnosis not present

## 2022-02-28 NOTE — Patient Instructions (Signed)
Medication Instructions:  ?Your physician recommends that you continue on your current medications as directed. Please refer to the Current Medication list given to you today. ? ?*If you need a refill on your cardiac medications before your next appointment, please call your pharmacy* ? ? ?Lab Work: ?None ordered ? ? ?Testing/Procedures: ?None ordered ? ? ?Follow-Up: ?At [facility], you and your health needs are our priority.  As part of our continuing mission to provide you with exceptional heart care, we have created designated Provider Care Teams.  These Care Teams include your primary Cardiologist (physician) and Advanced Practice Providers (APPs -  Physician Assistants and Nurse Practitioners) who all work together to provide you with the care you need, when you need it. ? ?Your next appointment:   ?1 year(s) ? ?The format for your next appointment:   ?In Person ? ?Provider:   ?You will see one of the following Advanced Practice Providers on your designated Care Team:   ?Renee Ursuy, PA-C ?Michael "Andy" Tillery, PA-C ? ? ? ? ?Thank you for choosing [facility]!! ? ? ?Rushton Early, RN ?[phone] ? ?

## 2022-02-28 NOTE — Progress Notes (Signed)
Electrophysiology Office Note   Date:  02/28/2022   ID:  Taquila, Kelly Thornton 01, 1962, MRN [id number]  PCP:  Kelly Amel, MD  Cardiologist:  Johnsie Cancel Primary Electrophysiologist:  Kelly Thornton Meredith Leeds, MD    No chief complaint on file.     History of Present Illness: Kelly Thornton is a 61 y.o. female who is being seen today for the evaluation of PVCs at the request of Kelly, Dibas, MD. Presenting today for electrophysiology evaluation.    She has a history seen for asthma, diabetes, hypertension, hyperlipidemia, morbid obesity, Sjogren's syndrome, fibromyalgia.  She was referred to cardiology clinic for PVCs.  She wore a monitor with a 25% burden and episodes of nonsustained VT for up to 12 beats.  Echo showed a normal ejection fraction.  She was started on flecainide but did not tolerate the medication.  She was switched to mexiletine with improvement in her symptoms.  Today, denies symptoms of palpitations, chest pain, shortness of breath, orthopnea, PND, lower extremity edema, claudication, dizziness, presyncope, syncope, bleeding, or neurologic sequela. The patient is tolerating medications without difficulties.  Seen she has done well.  She has no chest pain or shortness of breath.  She is noted no further PVCs.  She is overall happy with her control.  She recently had surgery for hammertoes on her right foot.  She is wearing a boot currently.  She states that she Kelly Thornton need surgery on her left foot eventually.   Past Medical History:  Diagnosis Date   Anxiety    Arthritis    Asthma    Cataract    Chronic interstitial cystitis    Cough variant asthma 04/02/2018   FENO 04/02/2018  =   11 on advair 250 one bid - Spirometry 04/02/2018  FEV1 2.39 (84%)  Ratio 87 with truncation of peak flow p advair prior  - 04/02/2018  After extensive coaching inhaler device  effectiveness =    75% from a baseline of 50% so try symbicort 80  1-2 bid     Depression    Diabetes mellitus without  complication (HCC)    Dyspnea    Endometriosis    Fibromyalgia    GERD (gastroesophageal reflux disease)    Hearing loss    Hyperlipidemia    Hypertension    Hypothyroidism    Irritable bowel syndrome (IBS)    Meniere disease    Bilateral   Morbid obesity ([place])    Nonsustained ventricular tachycardia    PONV (postoperative nausea and vomiting)    Premature ventricular contractions (PVCs) (VPCs)    Seasonal allergies    Sjogren's disease ([place])    Thyroid disease    Wears hearing aid in both ears    Past Surgical History:  Procedure Laterality Date   BIOPSY  08/15/2020   Procedure: BIOPSY;  Surgeon: Wilford Corner, MD;  Location: [facility];  Service: Endoscopy;;   bladder stretching     COLONOSCOPY WITH PROPOFOL N/A 08/15/2020   Procedure: COLONOSCOPY WITH PROPOFOL;  Surgeon: Wilford Corner, MD;  Location: [facility];  Service: Endoscopy;  Laterality: N/A;   CYSTOSCOPY     DIAGNOSTIC LAPAROSCOPY     x4   DILATION AND CURETTAGE OF UTERUS     ENDOMETRIAL BIOPSY     EXCISION OF TONGUE LESION Left 10/02/2018   Procedure: EXCISION OF TONGUE LESION;  Surgeon: [name], MD;  Location: [facility];  Service: ENT;  Laterality: Left;   EYE SURGERY     NASAL SEPTUM  SURGERY     STAPEDECTOMY     TONSILLECTOMY AND ADENOIDECTOMY     TUBAL LIGATION     WISDOM TOOTH EXTRACTION       Current Outpatient Medications  Medication Sig Dispense Refill   albuterol (PROVENTIL HFA;VENTOLIN HFA) 108 (90 BASE) MCG/ACT inhaler Inhale 2 puffs into the lungs every 6 (six) hours as needed for wheezing or shortness of breath.     alclomethasone (ACLOVATE) 0.05 % cream Apply topically 2 (two) times daily as needed (Rash). 180 g 3   amitriptyline (ELAVIL) 100 MG tablet Take 100 mg by mouth at bedtime.     buPROPion (WELLBUTRIN XL) 150 MG 24 hr tablet Take 150 mg by mouth daily.     carvedilol (COREG) 12.5 MG tablet Take 1 tablet (12.5 mg total) by mouth 2 (two) times daily. 180 tablet 2    cetirizine (ZYRTEC) 10 MG tablet Take 10 mg by mouth daily.     Cholecalciferol (VITAMIN D) 2000 units tablet Take 2,000 Units by mouth daily.      diphenhydrAMINE (BENADRYL) 25 MG tablet Take 25 mg by mouth at bedtime.     EPINEPHrine 0.3 mg/0.3 mL IJ SOAJ injection Inject 0.3 mg into the muscle as needed for anaphylaxis ("from seafood").      Eyelid Cleansers (AVENOVA) 0.01 % SOLN Place 1 application into both eyes 2 (two) times daily.     fluocinonide (LIDEX) 0.05 % external solution Apply 1-2 application topically daily as needed (Scalp). As directed     fluticasone (FLONASE) 50 MCG/ACT nasal spray Place 2 sprays into both nostrils at bedtime.     irbesartan (AVAPRO) 75 MG tablet Take 75 mg by mouth daily.     levothyroxine (SYNTHROID, LEVOTHROID) 200 MCG tablet Take 200 mcg by mouth daily before breakfast.      metFORMIN (GLUCOPHAGE-XR) 500 MG 24 hr tablet Take 500 mg by mouth at bedtime.      mexiletine (MEXITIL) 150 MG capsule TAKE 1 CAPSULE BY MOUTH  TWICE DAILY 180 capsule 3   Multiple Vitamin (MULTIVITAMIN) capsule Take 1 capsule by mouth daily.     NASAL SALINE NA Place 2 sprays into both nostrils daily as needed (for dryness or congestion).      OVER THE COUNTER MEDICATION Take 1 capsule by mouth at bedtime. Peptiva probiotic     pentosan polysulfate (ELMIRON) 100 MG capsule Take 200 mg by mouth 2 (two) times daily.     rosuvastatin (CRESTOR) 5 MG tablet Take 5 mg by mouth at bedtime.      SYMBICORT 160-4.5 MCG/ACT inhaler Inhale 2 puffs into the lungs every morning.     triamterene-hydrochlorothiazide (MAXZIDE-25) 37.5-25 MG tablet Take 1 tablet by mouth daily.     venlafaxine XR (EFFEXOR-XR) 150 MG 24 hr capsule Take 150 mg by mouth daily with breakfast.     azithromycin (ZITHROMAX) 250 MG tablet Take 2 on day one then 1 daily x 4 days (Patient not taking: Reported on 02/28/2022) 6 tablet 0   ketoconazole (NIZORAL) 2 % shampoo Let sit 3-5 minutes before rinsing (Patient not taking:  Reported on 02/28/2022) 120 mL 9   predniSONE (DELTASONE) 10 MG tablet Take  4 each am x 2 days,   2 each am x 2 days,  1 each am x 2 days and stop (Patient not taking: Reported on 02/28/2022) 14 tablet 0   No current facility-administered medications for this visit.    Allergies:   Isopropyl alcohol, Mineral oil, Monosodium glutamate, Nickel,  Other, Pneumovax [pneumococcal polysaccharide vaccine], Procaine, Rubbing alcohol [alcohol], Shellfish allergy, Silicone dioxide [silica], Ace inhibitors, Avelox [moxifloxacin hcl in nacl], Cephalosporins, Chocolate, Clofexamide, Cocoa, Codeine, Darvon [propoxyphene], Flavoring agent, Fluorescein-benoxinate, Hydrocodone-acetaminophen, Lisinopril, Silicone, Simvastatin, Tape, Tetracyclines & related, Bextra [valdecoxib], Biaxin [clarithromycin], Ceclor [cefaclor], Doxycycline, Erythromycin, Hibiclens [chlorhexidine gluconate], Keflex [cephalexin], Penicillins, Povidone-iodine, and Thimerosal   Social History:  The patient  reports that she has never smoked. She has never used smokeless tobacco. She reports that she does not currently use alcohol. She reports that she does not use drugs.   Family History:  The patient's family history includes Cancer in an other family member; Heart disease in an other family member; Hyperlipidemia in an other family member; Hypertension in an other family member; Sleep apnea in an other family member.   ROS:  Please see the history of present illness.   Otherwise, review of systems is positive for none.   All other systems are reviewed and negative.   PHYSICAL EXAM: VS:  BP 126/74    Pulse 82    Ht 5' 6.5" (1.689 m)    Wt (!) 334 lb (151.5 kg)    SpO2 98%    BMI 53.10 kg/m  , BMI Body mass index is 53.1 kg/m. GEN: Well nourished, well developed, in no acute distress  HEENT: normal  Neck: no JVD, carotid bruits, or masses Cardiac: RRR; no murmurs, rubs, or gallops,no edema  Respiratory:  clear to auscultation bilaterally,  normal work of breathing GI: soft, nontender, nondistended, + BS MS: no deformity or atrophy  Skin: warm and dry Neuro:  Strength and sensation are intact Psych: euthymic mood, full affect  EKG:  EKG is ordered today. Personal review of the ekg ordered shows sinus rhythm, rate 82  Recent Labs: 03/29/2021: BUN 22; Creatinine, Ser 1.12; Potassium 4.8; Sodium 141    Lipid Panel  No results found for: CHOL, TRIG, HDL, CHOLHDL, VLDL, LDLCALC, LDLDIRECT   Wt Readings from Last 3 Encounters:  02/28/22 (!) 334 lb (151.5 kg)  11/06/21 (!) 338 lb 9.6 oz (153.6 kg)  09/04/21 (!) 352 lb 6.4 oz (159.8 kg)      Other studies Reviewed: Additional studies/ records that were reviewed today include: TTE 01/16/18  Review of the above records today demonstrates:  - Left ventricle: The cavity size was normal. Wall thickness was   increased in a pattern of mild LVH. Systolic function was normal.   The estimated ejection fraction was in the range of 55% to 60%.   The study is not technically sufficient to allow evaluation of LV   diastolic function.  Holter 01/04/18 - personally reviewed NSR Average HR 101 range 78-141 bpm Frequent PVCls and NSVT longest range 12 beats Ventricular ectopy 25% total beats Refer to EP for suppressive Rx   ASSESSMENT AND PLAN:  1.  PVCs: Burden of 25%.  Currently on mexiletine 150 mg twice daily.  High risk medication monitoring via ECG.  She did not tolerate flecainide.  Her burden is significantly reduced.  We Mckensi Redinger continue with current management.  2.  Hypertension:currently well controlled  3.  Hyperlipidemia: Continue Crestor 5 mg daily per primary care   Current medicines are reviewed at length with the patient today.   The patient does not have concerns regarding her medicines.  The following changes were made today: none  Labs/ tests ordered today include:  Orders Placed This Encounter  Procedures   EKG 12-Lead     Disposition:   FU with Dylin Ihnen  Jorgina Binning 12 months  Signed, Darleene Cumpian Meredith Leeds, MD  02/28/2022 8:11 AM     [facility] [address]  [place] [address] [phone] (office) [phone] (fax)

## 2022-03-22 ENCOUNTER — Encounter: Payer: Self-pay | Admitting: Internal Medicine

## 2022-03-22 MED ORDER — PREDNISONE 10 MG PO TABS: ORAL_TABLET | ORAL | 0 refills | Status: DC

## 2022-03-22 NOTE — Telephone Encounter (Signed)
Dr Melvyn Novas please advise: ? ? ?I continue the use of my Symbicort inhaler as directed every day. I was wondering if you would be willing to call in a course of Prednisone? My oxygenation is staying around 97. It feels like this is allergy related? Thanks for your consideration. ?Kelly Thornton ?[phone] ? ? ? ?

## 2022-03-22 NOTE — Telephone Encounter (Signed)
Called and spoke with patient. She verbalized understanding. RX has been sent to [facility].  ? ?Nothing further needed at time of call.  ?

## 2022-03-22 NOTE — Telephone Encounter (Signed)
Prednisone 10 mg take  4 each am x 2 days,   2 each am x 2 days,  1 each am x 2 days and stop  ? ?Ov with all meds next available to make sure we have her on the right maintenance rx  ?

## 2022-05-02 ENCOUNTER — Ambulatory Visit (INDEPENDENT_AMBULATORY_CARE_PROVIDER_SITE_OTHER): Payer: No Typology Code available for payment source | Admitting: Physician Assistant

## 2022-05-02 ENCOUNTER — Encounter: Payer: Self-pay | Admitting: Physician Assistant

## 2022-05-02 DIAGNOSIS — L821 Other seborrheic keratosis: Secondary | ICD-10-CM | POA: Diagnosis not present

## 2022-05-02 DIAGNOSIS — L219 Seborrheic dermatitis, unspecified: Secondary | ICD-10-CM | POA: Diagnosis not present

## 2022-05-02 DIAGNOSIS — B078 Other viral warts: Secondary | ICD-10-CM

## 2022-05-02 MED ORDER — CLOBETASOL PROPIONATE 0.05 % EX SOLN: 1.0000 "application " | Freq: Two times a day (BID) | CUTANEOUS | 6 refills | Status: DC

## 2022-05-02 MED ORDER — ALCLOMETASONE DIPROPIONATE 0.05 % EX CREA: TOPICAL_CREAM | Freq: Two times a day (BID) | CUTANEOUS | 3 refills | Status: DC | PRN

## 2022-05-02 MED ORDER — CICLOPIROX OLAMINE 0.77 % EX SUSP: 1.0000 "application " | Freq: Two times a day (BID) | CUTANEOUS | 6 refills | Status: DC

## 2022-05-02 NOTE — Progress Notes (Signed)
? ?  Follow-Up Visit ?  ?Subjective  ?Kelly Thornton is a 61 y.o. female who presents for the following: Skin Problem (Patient has a list. Patient has lesion on her left side face. X weeks. No history of skin cancers. ). ?She has a history of seborrheic dermatitis. She is in a significant flare on her scalp and face.  ? ?The following portions of the chart were reviewed this encounter and updated as appropriate:  Tobacco  Allergies  Meds  Problems  Med Hx  Surg Hx  Fam Hx   ?  ? ?Objective  ?Well appearing patient in no apparent distress; mood and affect are within normal limits. ? ?All skin waist up examined. ? ?Scalp, face and ears ?Xerosis, erythema and irritation.  ? ?Left Anterior Neck, Left Buccal Cheek, Right Anterior Neck ?Verrucous papules -- Discussed viral etiology and contagion.   ? ?Mid Back ?Stuck-on, waxy papules and plaques.  ? ? ?Assessment & Plan  ?Seborrheic dermatitis ?Scalp, face and ears ? ?ciclopirox (LOPROX) 0.77 % SUSP - Scalp, face and ears ?Apply 1 application. topically 2 (two) times daily. Q am ? ?alclomethasone (ACLOVATE) 0.05 % cream - Scalp, face and ears ?Apply topically 2 (two) times daily as needed (Rash). For face. ? ?clobetasol (TEMOVATE) 0.05 % external solution - Scalp, face and ears ?Apply 1 application. topically 2 (two) times daily. For scalp at night- not for face. ? ?Related Medications ?ketoconazole (NIZORAL) 2 % shampoo ?Let sit 3-5 minutes before rinsing ? ?Other viral warts (3) ?Left Buccal Cheek; Left Anterior Neck; Right Anterior Neck ? ?Scissor removal. ? ?Destruction of lesion - Left Anterior Neck, Left Buccal Cheek, Right Anterior Neck ?Complexity: simple   ?Destruction method: cryotherapy   ?Informed consent: discussed and consent obtained   ?Timeout:  patient name, date of birth, surgical site, and procedure verified ?Anesthesia: the lesion was anesthetized in a standard fashion   ?Lesion destroyed using liquid nitrogen: Yes   ?Cryotherapy cycles:   1 ?Hemostasis achieved with:  pressure ?Outcome: patient tolerated procedure well with no complications   ?Post-procedure details: wound care instructions given   ? ?Seborrheic keratosis ?Mid Back ? ?Observe. Ok to leave if stable. ? ? ? ?I, Exa Bomba, PA-C, have reviewed all documentation's for this visit.  The documentation on 05/02/22 for the exam, diagnosis, procedures and orders are all accurate and complete. ?

## 2022-05-27 ENCOUNTER — Other Ambulatory Visit: Payer: Self-pay | Admitting: Gastroenterology

## 2022-05-27 ENCOUNTER — Ambulatory Visit
Admission: RE | Admit: 2022-05-27 | Discharge: 2022-05-27 | Disposition: A | Discharge status: Home / Self Care | Payer: No Typology Code available for payment source | Source: Ambulatory Visit | Attending: Gastroenterology | Admitting: Gastroenterology

## 2022-05-27 DIAGNOSIS — R195 Other fecal abnormalities: Secondary | ICD-10-CM

## 2022-05-27 DIAGNOSIS — R1032 Left lower quadrant pain: Secondary | ICD-10-CM

## 2022-07-25 DIAGNOSIS — N3941 Urge incontinence: Secondary | ICD-10-CM | POA: Insufficient documentation

## 2022-09-14 ENCOUNTER — Other Ambulatory Visit: Payer: Self-pay | Admitting: Cardiology

## 2022-09-14 ENCOUNTER — Other Ambulatory Visit: Payer: Self-pay | Admitting: Cardiovascular Disease

## 2022-09-14 DIAGNOSIS — I10 Essential (primary) hypertension: Secondary | ICD-10-CM

## 2022-11-07 ENCOUNTER — Ambulatory Visit: Payer: No Typology Code available for payment source | Admitting: Internal Medicine

## 2022-11-08 ENCOUNTER — Other Ambulatory Visit: Payer: Self-pay | Admitting: *Deleted

## 2022-11-08 DIAGNOSIS — I10 Essential (primary) hypertension: Secondary | ICD-10-CM

## 2022-11-08 MED ORDER — CARVEDILOL 12.5 MG PO TABS: 12.5000 mg | ORAL_TABLET | Freq: Two times a day (BID) | ORAL | 1 refills | Status: DC

## 2022-11-12 ENCOUNTER — Other Ambulatory Visit (HOSPITAL_COMMUNITY)
Admission: RE | Admit: 2022-11-12 | Discharge: 2022-11-12 | Disposition: A | Discharge status: Home / Self Care | Payer: No Typology Code available for payment source | Source: Ambulatory Visit | Attending: Nurse Practitioner | Admitting: Nurse Practitioner

## 2022-11-12 ENCOUNTER — Ambulatory Visit (INDEPENDENT_AMBULATORY_CARE_PROVIDER_SITE_OTHER): Payer: No Typology Code available for payment source | Admitting: Nurse Practitioner

## 2022-11-12 ENCOUNTER — Encounter: Payer: Self-pay | Admitting: Nurse Practitioner

## 2022-11-12 VITALS — BP 128/72 | HR 66 | Ht 67.0 in | Wt 301.0 lb

## 2022-11-12 DIAGNOSIS — N898 Other specified noninflammatory disorders of vagina: Secondary | ICD-10-CM

## 2022-11-12 DIAGNOSIS — N763 Subacute and chronic vulvitis: Secondary | ICD-10-CM | POA: Diagnosis not present

## 2022-11-12 DIAGNOSIS — Z01419 Encounter for gynecological examination (general) (routine) without abnormal findings: Secondary | ICD-10-CM | POA: Diagnosis present

## 2022-11-12 DIAGNOSIS — Z780 Asymptomatic menopausal state: Secondary | ICD-10-CM | POA: Diagnosis not present

## 2022-11-12 LAB — WET PREP FOR TRICH, YEAST, CLUE

## 2022-11-12 MED ORDER — NYSTATIN-TRIAMCINOLONE 100000-0.1 UNIT/GM-% EX OINT: 1.0000 | TOPICAL_OINTMENT | Freq: Two times a day (BID) | CUTANEOUS | 2 refills | Status: DC

## 2022-11-12 NOTE — Progress Notes (Signed)
Kelly Thornton 11/27/1961 [id number]   History:  61 y.o. G0 presents as new patient to establish care. Complains of rash on labia x 5 years. Has history of recurrent yeast infections. Has noticed a small bump on right labia that whole time, unchanged. Uses Nystatin and topical steroids (minimally) as needed. Premature ovarian insufficiency at age 61, on Depo for years after this, was on HRT at some point, no bleeding. H/O endometriosis, benign endometrial polyps. H/O DM, HLD, HTN, hypothyroidism, Sjogren's.   Gynecologic History No LMP recorded. Patient is postmenopausal.   Contraception/Family planning: post menopausal status Sexually active: Yes  Health Maintenance Last Pap: 07/11/2016. Results were: Normal neg HPV Last mammogram: 11/2021. Results were: Normal per patient Last colonoscopy: 08/15/2020. Results were: Ischemic colitis, 5-year recall Last Dexa: 01/28/2002. Results were: Normal. Scheduled in December  Past medical history, past surgical history, family history and social history were all reviewed and documented in the EPIC chart. Married. Works in Orthoptist for [employer]. Husband works for [employer].   ROS:  A ROS was performed and pertinent positives and negatives are included.  Exam:  Vitals:   11/12/22 0822  BP: 128/72  Pulse: 66  SpO2: 100%  Weight: (!) 301 lb (136.5 kg)  Height: '5\' 7"'$  (1.702 m)   Body mass index is 47.14 kg/m.  General appearance:  Normal Thyroid:  Symmetrical, normal in size, without palpable masses or nodularity. Respiratory  Auscultation:  Clear without wheezing or rhonchi Cardiovascular  Auscultation:  Regular rate, without rubs, murmurs or gallops  Edema/varicosities:  Not grossly evident Abdominal  Soft,nontender, without masses, guarding or rebound.  Liver/spleen:  No organomegaly noted  Hernia:  None appreciated  Skin  Inspection:  Grossly normal Breasts: Examined lying and sitting.   Right: Without masses, retractions, nipple  discharge or axillary adenopathy.   Left: Without masses, retractions, nipple discharge or axillary adenopathy. Genitourinary   Inguinal/mons:  Normal without inguinal adenopathy  External genitalia:  ~1 mm whitish, raised area (likely sebaceous cyst) on right labia majora. Very mind generalized vulvar redness  BUS/Urethra/Skene's glands:  Normal  Vagina:  Discharge and erythema present  Cervix:  Normal appearing without discharge or lesions  Uterus:  Difficult to palpate due to body habitus but no gross masses or tenderness  Adnexa/parametria:     Rt: Normal in size, without masses or tenderness.   Lt: Normal in size, without masses or tenderness.  Anus and perineum: Normal  Digital rectal exam: Deferred  Patient informed chaperone available to be present for breast and pelvic exam. Patient has requested no chaperone to be present. Patient has been advised what will be completed during breast and pelvic exam.   Wet prep negative for pathogens  Assessment/Plan:  61 y.o. G0 to establish care.   Well female exam with routine gynecological exam - Plan: Cytology - PAP( [place]). Education provided on SBEs, importance of preventative screenings, current guidelines, high calcium diet, regular exercise, and multivitamin daily. Labs with PCP.   Postmenopausal - POI at age 61 Was on HRT briefly in her 5s. No bleeding.   Vaginal discharge - Plan: [place], CLUE. Negative wet prep.   Chronic vulvitis - Plan: nystatin-triamcinolone ointment (MYCOLOG) as needed.  Screening for cervical cancer - Normal Pap history.  Pap today.   Screening for breast cancer - Normal mammogram history.  Continue annual screenings.  Normal breast exam today.  Screening for colon cancer - 2021 colonoscopy. Will repeat at 5-year interval per GI's  recommendation.   Screening for osteoporosis - DXA scheduled in December.   Return in 1 year for annual.     Tamela Gammon DNP, 9:17 AM  11/12/2022

## 2022-11-18 ENCOUNTER — Encounter: Payer: Self-pay | Admitting: Internal Medicine

## 2022-11-18 ENCOUNTER — Encounter: Payer: Self-pay | Admitting: Nurse Practitioner

## 2022-11-18 ENCOUNTER — Ambulatory Visit (INDEPENDENT_AMBULATORY_CARE_PROVIDER_SITE_OTHER): Payer: No Typology Code available for payment source | Admitting: Internal Medicine

## 2022-11-18 VITALS — BP 124/68 | HR 94 | Temp 97.9°F | Ht 67.0 in | Wt 298.4 lb

## 2022-11-18 DIAGNOSIS — J45991 Cough variant asthma: Secondary | ICD-10-CM

## 2022-11-18 LAB — CYTOLOGY - PAP
Comment: NEGATIVE
Diagnosis: NEGATIVE
High risk HPV: NEGATIVE

## 2022-11-18 MED ORDER — METHYLPREDNISOLONE ACETATE 80 MG/ML IJ SUSP
120.0000 mg | Freq: Once | INTRAMUSCULAR | Status: AC
  Administered 2022-11-18: 120 mg via INTRAMUSCULAR

## 2022-11-18 MED ORDER — BUDESONIDE-FORMOTEROL FUMARATE 80-4.5 MCG/ACT IN AERO: INHALATION_SPRAY | RESPIRATORY_TRACT | 12 refills | Status: DC

## 2022-11-18 NOTE — Assessment & Plan Note (Addendum)
Onset around age 61 with background of recurrent sinus infections FENO 04/02/2018  =   11 on advair 250 one bid - Spirometry 04/02/2018  FEV1 2.39 (84%)  Ratio 87 with truncation of peak flow p advair prior  - 04/02/2018   try symbicort 80  1-2 bid   - PFT's  05/19/2018  FEV1 2.60 (90 % ) ratio 91  p 17 % improvement from saba p nothing prior to study(since symb 80 x 2 x  12h prior ) with DLCO  81 % corrects to 122  % for alv volume   - 05/19/2018  After extensive coaching inhaler device  effectiveness =    75% from a baseline of 50%  - Allergy profile 05/19/2018 >  Eos 0.0 /  IgE  < 2 RAST neg  - 08/26/2018  After extensive coaching inhaler device,  effectiveness =  75% (short ti)    - FENO 08/26/2018  =   13 p symb 160 > changed to 80 2bid due to hoarseness/throat clearing  - 11/18/2022  After extensive coaching inhaler device,  effectiveness =    75% (short Ti)  Strongly suspect more UACS than asthma here as chronically coughing with slt raspy voice and now acutely ill with "wheeze" p ? Viral uri with clear lungs on exam and no cough on isnp/exp maneuvers   Rec  Depomedrol 120 mg IM but no more systemic steroids Try again reducing symbicort to the 80 dosed 2 bid prn Based on two studies from [facility]  378; 20 p 1865 (2018) and 380 : p2020-30 (2019) in pts with mild asthma it is reasonable to use low dose symbicort eg 80 2bid "prn" flare in this setting but I emphasized this was only shown with symbicort and takes advantage of the rapid onset of action but is not the same as "rescue therapy" but can be stopped once the acute symptoms have resolved and the need for rescue has been minimized (< 2 x weekly)           Each maintenance medication was reviewed in detail including emphasizing most importantly the difference between maintenance and prns and under what circumstances the prns are to be triggered using an action plan format where appropriate.  Total time for H and P, chart review, counseling,  reviewing hfa device(s) and generating customized AVS unique to this office visit / same day charting = 32 min acute visit done during annual pre-scheduled eval.

## 2022-11-18 NOTE — Patient Instructions (Signed)
Depomdrol 120 mg IM today   Work on inhaler technique:  relax and gently blow all the way out then take a nice smooth full deep breath back in, triggering the inhaler at same time you start breathing in.  Hold breath in for at least  5 seconds if you can. Blow out symbicort  thru nose. Rinse and gargle with water when done.  If mouth or throat bother you at all,  try brushing teeth/gums/tongue with arm and hammer toothpaste/ make a slurry and gargle and spit out.       When finish this round of symbicort, change to symbicort 80 Take 2 puffs first thing in am and then another 2 puffs about 12 hours later and only taper if doing great and no need for albuterol - this should reduce the irritation to your throat from the high dose spray  Please schedule a follow up visit in 12 months but call sooner if needed

## 2022-11-18 NOTE — Progress Notes (Signed)
Subjective:     Patient ID: Kelly Thornton, female   DOB: November 30, 1961,     MRN: [id number]    Brief patient profile:  61  yowf never smoker works as Medical laboratory scientific officer  for [employer] with sinus problems at age 61 = unable to breath thru nose but subsequently pattern of freq sinusitis developed req for abx  rx up to 4 x per year then around age 61 developed more of a chronic asthma pattern on advair since around 2001 and on it ever since and starting in Sept 2018 more doe so referred to pulmonary clinic 04/02/2018 by Dr  [name]    Steven's eval dust, ragweed  Luna Fuse for Sjogren's    History of Present Illness  04/02/2018 1st [facility] office visit/ Kelly Thornton   Chief Complaint  Patient presents with   Pulmonary Consult    Referred by Dr. [name]. Pt states she was dxed with Asthma in 1990. She has had increased SOB and wheezing x 8 months.   new onset sob assoc with throat clearing  X sept 2018 / ok sleeping on side  Also chronic dry mouth secondary to Sjogren's Using rescue inhaler once a month now avg  twice daily / worse with lysol exp  Doe = MMRC3 = can't walk 100 yards even at a slow pace at a flat grade s stopping due to sob rec Stop advair  Start Symbicort 80 Take 1  puffs first thing in am and then another 1 puffs about 12 hours later Work on inhaler technique:  Only use your albuterol as a rescue medication GERD diet     11/06/2021  Re-establish ov/Kelly Thornton re: asthma/rhinitis well controlled on prn symbicort one bid then ran out Oct 1st 2022 first day of covid symptom Oct 19 2021   rx neb prn s specific rx while still on coreg  12.5 mg bid  Chief Complaint  Patient presents with   Acute Visit    Day 18 since tested positive for covid.  Chest congestion with green mucous.  Out of Symbicort x 3 weeks.   Dyspnea:  MMRC1 = can walk nl pace, flat grade, can't hurry or go uphills or steps s sob   Cough: congested cough / nasal congestion on saline [employer] /  slt green tinged mucus  assoc with nasal congestion Sleeping: L side down one pillow flat bed  SABA use: twice daily  02: none Covid status:   vax x 3   Rec For cough > mucinex dm 1200 mg every 12hours and if still can't stop coughing > Try prilosec otc '20mg'$   Take 30-60 min before first meal of the day and Pepcid ac (famotidine) 20 mg one @  bedtime until cough is completely gone for at least a week without the need for cough suppression Zpak  Prednisone 10 mg take  4 each am x 2 days,   2 each am x 2 days,  1 each am x 2 days and stop  Resume symbicort 80 Take 2 puffs first thing in am and then another 2 puffs about 12 hours later and wean after a week    11/18/2022  f/u ov/Kelly Thornton re: asthma   maint on symbicort 160 1bid  / was ok until 10 d prior to OV  exp to sick coworker / neither tested for covid  Chief Complaint  Patient presents with   Follow-up   Acute Visit    Pt c/o wheezing and "rattling", PND and SOB x  10 days.   Dyspnea:  walks with cane/limited p foot surgery Feb 2023 but not really doe Cough: dry / clearing throat  Sleeping: flat bed on side/ one pillow ok  SABA use: up to 4 x weekly at baseline  02: none  Covid status:   vax x 3 and infected once      No obvious day to day or daytime variability or assoc excess/ purulent sputum or mucus plugs or hemoptysis or cp or chest tightness,   overt sinus or hb symptoms.   Sleeping as above without nocturnal  or early am exacerbation  of respiratory  c/o's or need for noct saba. Also denies any obvious fluctuation of symptoms with weather or environmental changes or other aggravating or alleviating factors except as outlined above   No unusual exposure hx or h/o childhood pna/ asthma or knowledge of premature birth.  Current Allergies, Complete Past Medical History, Past Surgical History, Family History, and Social History were reviewed in [facility] record.  ROS  The following are not active complaints unless  bolded Hoarseness, sore throat, dysphagia, dental problems, itching, sneezing,  nasal congestion or discharge of excess mucus or purulent secretions, ear ache,   fever, chills, sweats, unintended wt loss or wt gain, classically pleuritic or exertional cp,  orthopnea pnd or arm/hand swelling  or leg swelling, presyncope, palpitations, abdominal pain, anorexia, nausea, vomiting, diarrhea  or change in bowel habits or change in bladder habits, change in stools or change in urine, dysuria, hematuria,  rash, arthralgias, visual complaints, headache, numbness, weakness or ataxia or problems with walking or coordination,  change in mood or  memory.        Current Meds  Medication Sig   albuterol (PROVENTIL HFA;VENTOLIN HFA) 108 (90 BASE) MCG/ACT inhaler Inhale 2 puffs into the lungs every 6 (six) hours as needed for wheezing or shortness of breath.   albuterol (PROVENTIL) (2.5 MG/3ML) 0.083% nebulizer solution Take 2.5 mg by nebulization every 6 (six) hours as needed for wheezing or shortness of breath.   alclomethasone (ACLOVATE) 0.05 % cream Apply topically 2 (two) times daily as needed (Rash). For face.   amitriptyline (ELAVIL) 100 MG tablet Take 100 mg by mouth at bedtime.   carvedilol (COREG) 12.5 MG tablet Take 1 tablet (12.5 mg total) by mouth 2 (two) times daily.   cetirizine (ZYRTEC) 10 MG tablet Take 10 mg by mouth daily.   Cholecalciferol (VITAMIN D) 2000 units tablet Take 2,000 Units by mouth daily.    ciclopirox (LOPROX) 0.77 % SUSP Apply 1 application. topically 2 (two) times daily. Q am   clobetasol (TEMOVATE) 0.05 % external solution Apply 1 application. topically 2 (two) times daily. For scalp at night- not for face.   clotrimazole-betamethasone (LOTRISONE) cream Apply 1 Application topically 2 (two) times daily.   cromolyn (NASALCROM) 5.2 MG/ACT nasal spray Place 1 spray into both nostrils in the morning and at bedtime.   desvenlafaxine (PRISTIQ) 100 MG 24 hr tablet Take 100 mg by mouth  daily.   diphenhydrAMINE (BENADRYL) 25 MG tablet Take 25 mg by mouth at bedtime.   EPINEPHrine 0.3 mg/0.3 mL IJ SOAJ injection Inject 0.3 mg into the muscle as needed for anaphylaxis ("from seafood").    Eyelid Cleansers (AVENOVA) 0.01 % SOLN Place 1 application into both eyes 2 (two) times daily.   fluticasone (FLONASE) 50 MCG/ACT nasal spray Place 2 sprays into both nostrils at bedtime.   irbesartan (AVAPRO) 75 MG tablet Take 75 mg by  mouth daily.   levothyroxine (SYNTHROID, LEVOTHROID) 200 MCG tablet Take 200 mcg by mouth daily before breakfast.    metFORMIN (GLUCOPHAGE-XR) 500 MG 24 hr tablet Take 500 mg by mouth at bedtime.    mexiletine (MEXITIL) 150 MG capsule TAKE 1 CAPSULE BY MOUTH  TWICE DAILY   Multiple Vitamin (MULTIVITAMIN) capsule Take 1 capsule by mouth daily.   NASAL SALINE NA Place 2 sprays into both nostrils daily as needed (for dryness or congestion).    nystatin-triamcinolone ointment (MYCOLOG) Apply 1 Application topically 2 (two) times daily.   OVER THE COUNTER MEDICATION Take 1 capsule by mouth at bedtime. Peptiva probiotic   pentosan polysulfate (ELMIRON) 100 MG capsule Take 200 mg by mouth 2 (two) times daily.   rosuvastatin (CRESTOR) 5 MG tablet Take 5 mg by mouth at bedtime.    Semaglutide,0.25 or 0.'5MG'$ /DOS, (OZEMPIC, 0.25 OR 0.5 MG/DOSE,) 2 MG/1.5ML SOPN    SYMBICORT 160-4.5 MCG/ACT inhaler Inhale 2 puffs into the lungs every morning.   triamterene-hydrochlorothiazide (MAXZIDE-25) 37.5-25 MG tablet Take 1 tablet by mouth daily.            Objective:   Physical Exam    Wts  11/18/2022       298  11/06/2021       338  12/04/2018      367  08/26/2018          376  05/19/2018         376   04/02/18 (!) 381 lb (172.8 kg)  06/14/14 (!) 353 lb 14.4 oz (160.5 kg)    Vital signs reviewed  11/18/2022  - Note at rest 02 sats  97% on RA   General appearance:    amb MO (by BMI ) wf nad  HEENT : Oropharynx  clear       NECK :  without  apparent JVD/ palpable  Nodes/TM    LUNGS: no acc muscle use,  Nl contour chest which is clear to A and P bilaterally without cough on insp or exp maneuvers   CV:  RRR  no s3 or murmur or increase in P2, and trace bilateral pitting edema both ankles    ABD:  obese soft and nontender   MS:  Nl gait/ ext warm without deformities Or obvious joint restrictions  calf tenderness, cyanosis or clubbing    SKIN: warm and dry without lesions    NEURO:  alert, approp, nl sensorium with  no motor or cerebellar deficits apparent.           Assessment:

## 2022-12-03 ENCOUNTER — Encounter: Payer: Self-pay | Admitting: Nurse Practitioner

## 2022-12-15 ENCOUNTER — Encounter: Payer: Self-pay | Admitting: Internal Medicine

## 2022-12-17 NOTE — Telephone Encounter (Signed)
Unfortunately way too many allergies listed to hazzard additional medication but concerned about green mucus p taking the strongest abx made (levaquin) so needs ov to re group  - I have openings  in am 12/27 but only in [place] so if can't make it there see if can be seen in [place] by provider there.

## 2023-01-02 ENCOUNTER — Encounter: Payer: Self-pay | Admitting: Nurse Practitioner

## 2023-01-02 ENCOUNTER — Ambulatory Visit: Payer: No Typology Code available for payment source | Admitting: Nurse Practitioner

## 2023-01-02 VITALS — BP 100/60 | HR 103 | Ht 66.5 in | Wt 287.2 lb

## 2023-01-02 DIAGNOSIS — J4531 Mild persistent asthma with (acute) exacerbation: Secondary | ICD-10-CM

## 2023-01-02 DIAGNOSIS — J45901 Unspecified asthma with (acute) exacerbation: Secondary | ICD-10-CM | POA: Insufficient documentation

## 2023-01-02 DIAGNOSIS — B9689 Other specified bacterial agents as the cause of diseases classified elsewhere: Secondary | ICD-10-CM

## 2023-01-02 DIAGNOSIS — J205 Acute bronchitis due to respiratory syncytial virus: Secondary | ICD-10-CM | POA: Diagnosis not present

## 2023-01-02 DIAGNOSIS — R42 Dizziness and giddiness: Secondary | ICD-10-CM | POA: Insufficient documentation

## 2023-01-02 DIAGNOSIS — J019 Acute sinusitis, unspecified: Secondary | ICD-10-CM | POA: Insufficient documentation

## 2023-01-02 MED ORDER — PROMETHAZINE-DM 6.25-15 MG/5ML PO SYRP: 5.0000 mL | ORAL_SOLUTION | Freq: Four times a day (QID) | ORAL | 0 refills | Status: DC | PRN

## 2023-01-02 MED ORDER — PREDNISONE 10 MG PO TABS: ORAL_TABLET | ORAL | 0 refills | Status: DC

## 2023-01-02 NOTE — Assessment & Plan Note (Addendum)
Dizziness and labile BP. Her blood pressure in office today was 100/60. Suspect due to recent viral illness and dehydration given improvement in other symptoms. Advised her to push fluids. She will hold her BP medication until symptoms improve and closely monitor her BP. Use caution with position changes. Discussed fall risk. Strict ED precautions.

## 2023-01-02 NOTE — Patient Instructions (Addendum)
Continue Albuterol inhaler 2 puffs or 3 mL neb every 6 hours as needed for shortness of breath or wheezing. Notify if symptoms persist despite rescue inhaler/neb use. Use neb twice daily until symptoms improve Continue Symbicort 2 puffs Twice daily  Continue cetirizine 1 tab daily  Continue flonase nasal spray 2 sprays each nostril daily Continue nasalcrom 1 spray each nostril Twice daily   Saline nasal rinses Milta Deiters Med rinse or [employer]) twice a day followed by your nasal sprays about 20-30 minutes after Can use over the counter mucinex D for the next few days. Monitor your BP for goal <140/90 Complete levaquin as previously prescribed  Prednisone taper. 4 tabs for 2 days, then 3 tabs for 2 days, 2 tabs for 2 days, then 1 tab for 2 days, then stop. Take in AM with food Promethazine-DM cough syrup 5 mL every 6 hours as needed for cough. May cause drowsiness. Do not drive after taking Chlorpheniramine 4 mg tablets over the counter for drainage/allergies   Hold your carvedilol if you have having dizziness and low blood pressure. Restart if you are having readings >130/80-140/90  Follow up in 2 weeks with Dr. Melvyn Novas or Katie Clevie Prout,NP. If symptoms do not improve or worsen, please contact office for sooner follow up or seek emergency care.

## 2023-01-02 NOTE — Assessment & Plan Note (Signed)
Possible asthmatic bronchitis vs upper airway irritation. Cough has improved some with levaquin course and decrease nasal symptoms so I think the majority is related to postnasal drainage. However, we will move forward with treating her with steroid course. Cough control measures advised. We will target her nasal symptoms to facilitate drainage. She will continue levaquin course as previously prescribed. Since symptoms are improving, I think we can hold off on imaging today. If she develops worsening symptoms or new fevers, she will call to be seen sooner. Asthma action plan in place.  Patient Instructions  Continue Albuterol inhaler 2 puffs or 3 mL neb every 6 hours as needed for shortness of breath or wheezing. Notify if symptoms persist despite rescue inhaler/neb use. Use neb twice daily until symptoms improve Continue Symbicort 2 puffs Twice daily  Continue cetirizine 1 tab daily  Continue flonase nasal spray 2 sprays each nostril daily Continue nasalcrom 1 spray each nostril Twice daily   Saline nasal rinses Milta Deiters Med rinse or [employer]) twice a day followed by your nasal sprays about 20-30 minutes after Can use over the counter mucinex D for the next few days. Monitor your BP for goal <140/90 Complete levaquin as previously prescribed  Prednisone taper. 4 tabs for 2 days, then 3 tabs for 2 days, 2 tabs for 2 days, then 1 tab for 2 days, then stop. Take in AM with food Promethazine-DM cough syrup 5 mL every 6 hours as needed for cough. May cause drowsiness. Do not drive after taking Chlorpheniramine 4 mg tablets over the counter for drainage/allergies   Hold your carvedilol if you have having dizziness and low blood pressure. Restart if you are having readings >130/80-140/90  Follow up in 2 weeks with Dr. Melvyn Novas or Katie Spencer Peterkin,NP. If symptoms do not improve or worsen, please contact office for sooner follow up or seek emergency care.

## 2023-01-02 NOTE — Assessment & Plan Note (Signed)
Sinus tenderness and evidence of postnasal drainage on exam today. She will complete levaquin course. She is limited on abx due to allergies. Add on saline rinses and optimize therapies to facilitate drainage. If she rebounds or does not improve, we will send referral to ENT for further evaluation/management.

## 2023-01-02 NOTE — Progress Notes (Signed)
$'@Patient't$  ID: Kelly Thornton, female    DOB: 12-Feb-1961, 62 y.o.   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 She is a patient of Dr. Gustavus Bryant and last seen 11/18/2022. Past medical history significant for recurrent sinusitis, chronic interstitial cystitis, morbid obesity.  TEST/EVENTS:  05/19/2018 PFT: FEV1 2.60 (90%), ratio 91, 17% improvement from SABA, DLCO 81% corrects to 122% for alveolar volume 04/2018: eos 0, IgE <2, RAST neg 08/2018 FeNO 13 ppb   11/18/2022: OV with Dr. Melvyn Novas. Maintained on symbicort 160 bid. She was ok up until 10 days prior to visit after exposure to sick coworker. No testing for COVID. Treated with depo 120 mg inj.   01/02/2023: Today - acute Patient presents today for acute visit.  She had contacted the office on 12/24.  She tested positive for RSV on December 15.  She had been treated with prednisone 50 mg for 10 days and Levaquin 750 mg for 5 days.  She completed both of these.  Felt like her cough had returned, productive, and she was also wheezing more.  She was advised to come in for an office visit for further evaluation.  Today, she tells me that she saw her PCP and was started on another course of Levaquin for 7 days to treat for sinus infection.  She has 3 days left.  Her cough seems to be somewhat improved.  She does not feel like she is producing as much purulent sputum.  Still present throughout the day and at night.  Her sinus symptoms are better but she still has some tenderness and feels congested.  Appetite seems like it is getting better but not quite back to her baseline.  She has had some dizziness and lower blood pressures.  She feels like she is staying well-hydrated.  She is on carvedilol twice daily.  Breathing feels like it's at her baseline. She notices some occasional  wheezing, more so at night. Denies any palpitations, syncope, fevers, chills, hemoptysis, night sweats, nausea/vomiting.  She is using her Symbicort twice daily.  Has not had to use her albuterol as much over the last few days but still requiring it at least once. She is using flonase and nasalcrom.   Allergies  Allergen Reactions   Isopropyl Alcohol Rash   Mineral Oil Rash   Monosodium Glutamate Nausea And Vomiting   Nickel Rash   Other Anaphylaxis and Other (See Comments)    Other reaction(s): Other (See Comments) gastritis redness PNEUMO VAX, RUBBING ALCOHOL, MINERAL OIL, NICKEL, MSG, SILICON- REACTION  gastritis redness PNEUMO VAX, RUBBING ALCOHOL, MINERAL OIL, NICKEL, MSG, SILICON- REACTION  GI upset   Pneumovax [Pneumococcal Polysaccharide Vaccine] Rash   Procaine Shortness Of Breath and Rash   Rubbing Alcohol [Alcohol] Other (See Comments)    "burns skin"    Shellfish Allergy Anaphylaxis   Silicone Dioxide [Silica] Anaphylaxis and Rash   Ace Inhibitors Swelling    Lips swell   Avelox [Moxifloxacin Hcl In Nacl] Other (See Comments)    Gastritis   Cephalosporins     Other reaction(s): Unknown   Chocolate Other (See Comments)    Sneezing   Clofexamide     Other reaction(s): Unknown   Cocoa Other (See Comments)    Sneezing   Codeine Nausea Only and Other (See Comments)  GI upset   Darvon [Propoxyphene] Other (See Comments)    GI Upset   Flavoring Agent     Other reaction(s): Unknown   Fluorescein-Benoxinate Other (See Comments)    Eye irritation   Hydrocodone-Acetaminophen Other (See Comments)    GI upset Other reaction(s): Unknown   Lisinopril Swelling    Tongue and lips became swollen   Silicone     Other reaction(s): Unknown   Simvastatin Other (See Comments)    Leg cramps   Tape Other (See Comments)    Redness Other reaction(s): Unknown   Tetracyclines & Related Other (See Comments)    GI upset   Bextra [Valdecoxib] Rash   Biaxin [Clarithromycin]  Rash   Ceclor [Cefaclor] Rash   Doxycycline Rash   Erythromycin Rash   Hibiclens [Chlorhexidine Gluconate] Rash   Keflex [Cephalexin] Rash   Penicillins Rash    Did it involve swelling of the face/tongue/throat, SOB, or low BP? Yes Did it involve sudden or severe rash/hives, skin peeling, or any reaction on the inside of your mouth or nose? No Did you need to seek medical attention at a hospital or doctor's office? No When did it last happen? "I was a kid"   If all above answers are "NO", may proceed with cephalosporin use.    Povidone-Iodine Rash        Thimerosal (Thiomersal) Rash    Immunization History  Administered Date(s) Administered   Influenza, Quadrivalent, Recombinant, Inj, Pf 09/04/2018   Influenza,inj,Quad PF,6+ Mos 09/13/2019, 09/05/2021   Influenza-Unspecified 09/04/2018, 08/23/2022   PFIZER(Purple Top)SARS-COV-2 Vaccination 01/29/2020, 02/19/2020   Tdap 09/13/2019   Zoster Recombinat (Shingrix) 07/03/2019, 09/13/2019    Past Medical History:  Diagnosis Date   Abnormal uterine bleeding    Anxiety    Arthritis    Asthma    Cataract    Chronic interstitial cystitis    Cough variant asthma 04/02/2018   FENO 04/02/2018  =   11 on advair 250 one bid - Spirometry 04/02/2018  FEV1 2.39 (84%)  Ratio 87 with truncation of peak flow p advair prior  - 04/02/2018  After extensive coaching inhaler device  effectiveness =    75% from a baseline of 50% so try symbicort 80  1-2 bid     Depression    Diabetes mellitus without complication (HCC)    Dyspnea    Endometriosis    Fibromyalgia    GERD (gastroesophageal reflux disease)    Hearing loss    Hyperlipidemia    Hypertension    Hypothyroidism    Irritable bowel syndrome (IBS)    Meniere disease    Bilateral   Morbid obesity (HCC)    Nonsustained ventricular tachycardia (HCC)    PONV (postoperative nausea and vomiting)    Premature ventricular contractions (PVCs) (VPCs)    Seasonal allergies    Sjogren's disease  ([place])    Thyroid disease    Wears hearing aid in both ears     Tobacco History: Social History   Tobacco Use  Smoking Status Never  Smokeless Tobacco Never   Counseling given: Not Answered   Outpatient Medications Prior to Visit  Medication Sig Dispense Refill   albuterol (PROVENTIL HFA;VENTOLIN HFA) 108 (90 BASE) MCG/ACT inhaler Inhale 2 puffs into the lungs every 6 (six) hours as needed for wheezing or shortness of breath.     albuterol (PROVENTIL) (2.5 MG/3ML) 0.083% nebulizer solution Take 2.5 mg by nebulization every 6 (six) hours as needed for wheezing or shortness of breath.  budesonide-formoterol (SYMBICORT) 80-4.5 MCG/ACT inhaler Take 2 puffs first thing in am and then another 2 puffs about 12 hours later. 1 each 12   cetirizine (ZYRTEC) 10 MG tablet Take 10 mg by mouth daily.     cromolyn (NASALCROM) 5.2 MG/ACT nasal spray Place 1 spray into both nostrils in the morning and at bedtime.     fluticasone (FLONASE) 50 MCG/ACT nasal spray Place 2 sprays into both nostrils at bedtime.     alclomethasone (ACLOVATE) 0.05 % cream Apply topically 2 (two) times daily as needed (Rash). For face. 180 g 3   amitriptyline (ELAVIL) 100 MG tablet Take 100 mg by mouth at bedtime.     carvedilol (COREG) 12.5 MG tablet Take 1 tablet (12.5 mg total) by mouth 2 (two) times daily. 180 tablet 1   Cholecalciferol (VITAMIN D) 2000 units tablet Take 2,000 Units by mouth daily.      ciclopirox (LOPROX) 0.77 % SUSP Apply 1 application. topically 2 (two) times daily. Q am 60 mL 6   clobetasol (TEMOVATE) 0.05 % external solution Apply 1 application. topically 2 (two) times daily. For scalp at night- not for face. 50 mL 6   clotrimazole-betamethasone (LOTRISONE) cream Apply 1 Application topically 2 (two) times daily.     desvenlafaxine (PRISTIQ) 100 MG 24 hr tablet Take 100 mg by mouth daily.     diphenhydrAMINE (BENADRYL) 25 MG tablet Take 25 mg by mouth at bedtime.     EPINEPHrine 0.3 mg/0.3 mL IJ  SOAJ injection Inject 0.3 mg into the muscle as needed for anaphylaxis ("from seafood").      Eyelid Cleansers (AVENOVA) 0.01 % SOLN Place 1 application into both eyes 2 (two) times daily.     irbesartan (AVAPRO) 75 MG tablet Take 75 mg by mouth daily.     levothyroxine (SYNTHROID, LEVOTHROID) 200 MCG tablet Take 200 mcg by mouth daily before breakfast.      metFORMIN (GLUCOPHAGE-XR) 500 MG 24 hr tablet Take 500 mg by mouth at bedtime.      mexiletine (MEXITIL) 150 MG capsule TAKE 1 CAPSULE BY MOUTH  TWICE DAILY 180 capsule 1   Multiple Vitamin (MULTIVITAMIN) capsule Take 1 capsule by mouth daily.     NASAL SALINE NA Place 2 sprays into both nostrils daily as needed (for dryness or congestion).      nystatin-triamcinolone ointment (MYCOLOG) Apply 1 Application topically 2 (two) times daily. 60 g 2   OVER THE COUNTER MEDICATION Take 1 capsule by mouth at bedtime. Peptiva probiotic     pentosan polysulfate (ELMIRON) 100 MG capsule Take 200 mg by mouth 2 (two) times daily.     rosuvastatin (CRESTOR) 5 MG tablet Take 5 mg by mouth at bedtime.      Semaglutide,0.25 or 0.'5MG'$ /DOS, (OZEMPIC, 0.25 OR 0.5 MG/DOSE,) 2 MG/1.5ML SOPN      triamterene-hydrochlorothiazide (MAXZIDE-25) 37.5-25 MG tablet Take 1 tablet by mouth daily.     No facility-administered medications prior to visit.     Review of Systems:   Constitutional: No weight loss or gain, night sweats, fevers, chills, fatigue, or lassitude. HEENT: No headaches, difficulty swallowing, tooth/dental problems, or sore throat. No sneezing, itching, ear ache. +nasal congestion/drainage, sinus pressure, post nasal drip CV:  +dizziness. No chest pain, orthopnea, PND, swelling in lower extremities, anasarca, palpitations, syncope Resp: +shortness of breath with exertion (baseline); nocturnal wheeze; productive cough (improved). No hemoptysis. No chest wall deformity GI:  No heartburn, indigestion, abdominal pain, nausea, vomiting, diarrhea, change in  bowel habits,  loss of appetite, bloody stools.  GU: No dysuria, change in color of urine, urgency or frequency.   Skin: No rash, lesions, ulcerations MSK:  No joint pain or swelling.   Neuro: No numbness or tingling. No gait abnormalities. No mental status change. Psych: No depression or anxiety. Mood stable.     Physical Exam:  BP 100/60 (BP Location: Right Arm)   Pulse (!) 103   Ht 5' 6.5" (1.689 m)   Wt 287 lb 3.2 oz (130.3 kg)   SpO2 98%   BMI 45.66 kg/m   GEN: Pleasant, interactive, well-appearing; morbidly obese; in no acute distress. HEENT:  Normocephalic and atraumatic. EACs patent bilaterally. TM pearly gray with present light reflex bilaterally. PERRLA. Sclera white. Nasal turbinates erythematous, moist and patent bilaterally. White rhinorrhea present; postnasal drip present. Oropharynx erythematous and moist, without exudate or edema. No lesions, ulcerations NECK:  Supple w/ fair ROM. No JVD present. Normal carotid impulses w/o bruits. Thyroid symmetrical with no goiter or nodules palpated. No lymphadenopathy.   CV: RRR, no m/r/g, no peripheral edema. Pulses intact, +2 bilaterally. No cyanosis, pallor or clubbing. PULMONARY:  Unlabored, regular breathing. Clear bilaterally A&P w/o wheezes/rales/rhonchi. No accessory muscle use.  GI: BS present and normoactive. Soft, non-tender to palpation. No organomegaly or masses detected.  MSK: No erythema, warmth or tenderness. Cap refil <2 sec all extrem. No deformities or joint swelling noted.  Neuro: A/Ox3. No focal deficits noted.   Skin: Warm, no lesions or rashe Psych: Normal affect and behavior. Judgement and thought content appropriate.     Lab Results:  CBC    Component Value Date/Time   WBC 7.3 02/05/2019 1808   RBC 4.48 02/05/2019 1808   HGB 13.6 02/05/2019 1808   HCT 43.5 02/05/2019 1808   PLT 191 02/05/2019 1808   MCV 97.1 02/05/2019 1808   MCH 30.4 02/05/2019 1808   MCHC 31.3 02/05/2019 1808   RDW 13.5  02/05/2019 1808   LYMPHSABS 0.8 02/05/2019 1808   MONOABS 0.4 02/05/2019 1808   EOSABS 0.0 02/05/2019 1808   BASOSABS 0.0 02/05/2019 1808    BMET    Component Value Date/Time   NA 141 03/29/2021 1618   K 4.8 03/29/2021 1618   CL 99 03/29/2021 1618   CO2 23 03/29/2021 1618   GLUCOSE 94 03/29/2021 1618   GLUCOSE 157 (H) 02/05/2019 1808   BUN 22 03/29/2021 1618   CREATININE 1.12 (H) 03/29/2021 1618   CALCIUM 9.5 03/29/2021 1618   GFRNONAA 47 (L) 02/05/2019 1808   GFRAA 55 (L) 02/05/2019 1808    BNP No results found for: "BNP"   Imaging:  No results found.  methylPREDNISolone acetate (DEPO-MEDROL) injection 120 mg     Date Action Dose Route User   11/18/2022 1648 Given 120 mg Intramuscular (Left Upper Outer Quadrant) Rosana Berger, CMA          Latest Ref Rng & Units 05/19/2018    2:07 PM  PFT Results  FVC-Pre L 2.60   FVC-Predicted Pre % 70   FVC-Post L 2.87   FVC-Predicted Post % 77   Pre FEV1/FVC % % 85   Post FEV1/FCV % % 91   FEV1-Pre L 2.22   FEV1-Predicted Pre % 76   FEV1-Post L 2.60   DLCO uncorrected ml/min/mmHg 22.95   DLCO UNC% % 81   DLVA Predicted % 122   TLC L 4.51   TLC % Predicted % 81   RV % Predicted % 53  Lab Results  Component Value Date   NITRICOXIDE 13 08/26/2018        Assessment & Plan:   Asthmatic bronchitis with exacerbation Possible asthmatic bronchitis vs upper airway irritation. Cough has improved some with levaquin course and decrease nasal symptoms so I think the majority is related to postnasal drainage. However, we will move forward with treating her with steroid course. Cough control measures advised. We will target her nasal symptoms to facilitate drainage. She will continue levaquin course as previously prescribed. Since symptoms are improving, I think we can hold off on imaging today. If she develops worsening symptoms or new fevers, she will call to be seen sooner. Asthma action plan in place.  Patient  Instructions  Continue Albuterol inhaler 2 puffs or 3 mL neb every 6 hours as needed for shortness of breath or wheezing. Notify if symptoms persist despite rescue inhaler/neb use. Use neb twice daily until symptoms improve Continue Symbicort 2 puffs Twice daily  Continue cetirizine 1 tab daily  Continue flonase nasal spray 2 sprays each nostril daily Continue nasalcrom 1 spray each nostril Twice daily   Saline nasal rinses Milta Deiters Med rinse or [employer]) twice a day followed by your nasal sprays about 20-30 minutes after Can use over the counter mucinex D for the next few days. Monitor your BP for goal <140/90 Complete levaquin as previously prescribed  Prednisone taper. 4 tabs for 2 days, then 3 tabs for 2 days, 2 tabs for 2 days, then 1 tab for 2 days, then stop. Take in AM with food Promethazine-DM cough syrup 5 mL every 6 hours as needed for cough. May cause drowsiness. Do not drive after taking Chlorpheniramine 4 mg tablets over the counter for drainage/allergies   Hold your carvedilol if you have having dizziness and low blood pressure. Restart if you are having readings >130/80-140/90  Follow up in 2 weeks with Dr. Melvyn Novas or Katie Rajah Tagliaferro,NP. If symptoms do not improve or worsen, please contact office for sooner follow up or seek emergency care.    Acute bacterial rhinosinusitis Sinus tenderness and evidence of postnasal drainage on exam today. She will complete levaquin course. She is limited on abx due to allergies. Add on saline rinses and optimize therapies to facilitate drainage. If she rebounds or does not improve, we will send referral to ENT for further evaluation/management.   Orthostatic dizziness Dizziness and labile BP. Her blood pressure in office today was 100/60. Suspect due to recent viral illness and dehydration given improvement in other symptoms. Advised her to push fluids. She will hold her BP medication until symptoms improve and closely monitor her BP. Use caution with  position changes. Discussed fall risk. Strict ED precautions.    I spent 35 minutes of dedicated to the care of this patient on the date of this encounter to include pre-visit review of records, face-to-face time with the patient discussing conditions above, post visit ordering of testing, clinical documentation with the electronic health record, making appropriate referrals as documented, and communicating necessary findings to members of the patients care team.  Clayton Bibles, NP 01/02/2023  Pt aware and understands NP's role.

## 2023-01-15 NOTE — Progress Notes (Unsigned)
Subjective:     Patient ID: Kelly Thornton, female   DOB: 1961/09/02,     MRN: [id number]    Brief patient profile:  62  yowf never smoker works as Medical laboratory scientific officer  for [employer] with sinus problems at age 62 = unable to breath thru nose but subsequently pattern of freq sinusitis developed req for abx  rx up to 4 x per year then around age 38 developed more of a chronic asthma pattern on advair since around 2001 and on it ever since and starting in Sept 2018 more doe so referred to pulmonary clinic 04/02/2018 by Dr  [name]    Steven's eval dust, ragweed  Luna Fuse for Sjogren's    History of Present Illness  04/02/2018 1st [facility] office visit/ Zhamir Pirro   Chief Complaint  Patient presents with   Pulmonary Consult    Referred by Dr. [name]. Pt states she was dxed with Asthma in 1990. She has had increased SOB and wheezing x 8 months.   new onset sob assoc with throat clearing  X sept 2018 / ok sleeping on side  Also chronic dry mouth secondary to Sjogren's Using rescue inhaler once a month now avg  twice daily / worse with lysol exp  Doe = MMRC3 = can't walk 100 yards even at a slow pace at a flat grade s stopping due to sob rec Stop advair  Start Symbicort 80 Take 1  puffs first thing in am and then another 1 puffs about 12 hours later Work on inhaler technique:  Only use your albuterol as a rescue medication GERD diet     11/06/2021  Re-establish ov/Landry Kamath re: asthma/rhinitis well controlled on prn symbicort one bid then ran out Oct 1st 2022 first day of covid symptom Oct 19 2021   rx neb prn s specific rx while still on coreg  12.5 mg bid  Chief Complaint  Patient presents with   Acute Visit    Day 18 since tested positive for covid.  Chest congestion with green mucous.  Out of Symbicort x 3 weeks.   Dyspnea:  MMRC1 = can walk nl pace, flat grade, can't hurry or go uphills or steps s sob   Cough: congested cough / nasal congestion on saline Asencion Islam /  slt green tinged mucus  assoc with nasal congestion Sleeping: L side down one pillow flat bed  SABA use: twice daily  02: none Covid status:   vax x 3   Rec For cough > mucinex dm 1200 mg every 12hours and if still can't stop coughing > Try prilosec otc '20mg'$   Take 30-60 min before first meal of the day and Pepcid ac (famotidine) 20 mg one @  bedtime until cough is completely gone for at least a week without the need for cough suppression Zpak  Prednisone 10 mg take  4 each am x 2 days,   2 each am x 2 days,  1 each am x 2 days and stop  Resume symbicort 80 Take 2 puffs first thing in am and then another 2 puffs about 12 hours later and wean after a week    11/18/2022  f/u ov/Cedar Roseman re: asthma   maint on symbicort 160 1bid  / was ok until 10 d prior to OV  exp to sick coworker / neither tested for covid  Chief Complaint  Patient presents with   Follow-up   Acute Visit    Pt c/o wheezing and "rattling", PND and SOB x  10 days.   Dyspnea:  walks with cane/limited p foot surgery Feb 2023 but not really doe Cough: dry / clearing throat  Sleeping: flat bed on side/ one pillow ok  SABA use: up to 4 x weekly at baseline  02: none  Covid status:   vax x 3 and infected once  Rec Depomdrol 120 mg IM today  Work on inhaler technique:   When finish this round of symbicort, change to symbicort 80 Take 2 puffs first thing in am and then another 2 puffs about 12 hours later and only taper if doing great and no need for albuterol -   Please schedule a follow up visit in 12 months but call sooner if needed    01/16/2023  f/u ov/Adasia Hoar re: ***   maint on ***  No chief complaint on file.   Dyspnea:  *** Cough: *** Sleeping: *** SABA use: *** 02: *** Covid status:   *** Lung cancer screening :  ***    No obvious day to day or daytime variability or assoc excess/ purulent sputum or mucus plugs or hemoptysis or cp or chest tightness, subjective wheeze or overt sinus or hb symptoms.   *** without nocturnal  or early am  exacerbation  of respiratory  c/o's or need for noct saba. Also denies any obvious fluctuation of symptoms with weather or environmental changes or other aggravating or alleviating factors except as outlined above   No unusual exposure hx or h/o childhood pna/ asthma or knowledge of premature birth.  Current Allergies, Complete Past Medical History, Past Surgical History, Family History, and Social History were reviewed in [facility] record.  ROS  The following are not active complaints unless bolded Hoarseness, sore throat, dysphagia, dental problems, itching, sneezing,  nasal congestion or discharge of excess mucus or purulent secretions, ear ache,   fever, chills, sweats, unintended wt loss or wt gain, classically pleuritic or exertional cp,  orthopnea pnd or arm/hand swelling  or leg swelling, presyncope, palpitations, abdominal pain, anorexia, nausea, vomiting, diarrhea  or change in bowel habits or change in bladder habits, change in stools or change in urine, dysuria, hematuria,  rash, arthralgias, visual complaints, headache, numbness, weakness or ataxia or problems with walking or coordination,  change in mood or  memory.        No outpatient medications have been marked as taking for the 01/16/23 encounter (Appointment) with Tanda Rockers, MD.             Objective:   Physical Exam    Wts  01/16/2023         ***  11/18/2022       298  11/06/2021       338  12/04/2018      367  08/26/2018          376  05/19/2018         376   04/02/18 (!) 381 lb (172.8 kg)  06/14/14 (!) 353 lb 14.4 oz (160.5 kg)    Vital signs reviewed  01/16/2023  - Note at rest 02 sats  ***% on ***   General appearance:    ***    trace bilateral pitting edema both ankles***            Assessment:

## 2023-01-16 ENCOUNTER — Encounter: Payer: Self-pay | Admitting: Internal Medicine

## 2023-01-16 ENCOUNTER — Ambulatory Visit: Payer: No Typology Code available for payment source | Admitting: Internal Medicine

## 2023-01-16 VITALS — BP 134/76 | HR 108 | Temp 98.5°F | Ht 66.5 in | Wt 284.4 lb

## 2023-01-16 DIAGNOSIS — J45991 Cough variant asthma: Secondary | ICD-10-CM

## 2023-01-16 DIAGNOSIS — J329 Chronic sinusitis, unspecified: Secondary | ICD-10-CM

## 2023-01-16 NOTE — Assessment & Plan Note (Signed)
Onset around age 62 with background of recurrent sinus infections FENO 04/02/2018  =   11 on advair 250 one bid - Spirometry 04/02/2018  FEV1 2.39 (84%)  Ratio 87 with truncation of peak flow p advair prior  - 04/02/2018   try symbicort 80  1-2 bid   - PFT's  05/19/2018  FEV1 2.60 (90 % ) ratio 91  p 17 % improvement from saba p nothing prior to study(since symb 80 x 2 x  12h prior ) with DLCO  81 % corrects to 122  % for alv volume   - 05/19/2018  After extensive coaching inhaler device  effectiveness =    75% from a baseline of 50%  - Allergy profile 05/19/2018 >  Eos 0.0 /  IgE  < 2 RAST neg  - 08/26/2018  After extensive coaching inhaler device,  effectiveness =  75% (short ti)    - FENO 08/26/2018  =   13 p symb 160 > changed to 80 2bid due to hoarseness/throat clearing   >>>  01/16/2023  After extensive coaching inhaler device,  effectiveness =    75% (short Ti) > continue symbicort 80 as most of her problem appears to more upper airway in nature (see chronic sinusitis)

## 2023-01-16 NOTE — Addendum Note (Signed)
Addended byOralia Rud M on: 01/16/2023 12:35 PM   Modules accepted: Orders

## 2023-01-16 NOTE — Assessment & Plan Note (Signed)
Body mass index is 45.22 kg/m.  -  trending down No results found for: "TSH"    Contributing to doe and risk of GERD >>>   reviewed the need and the process to achieve and maintain neg calorie balance > defer f/u primary care including intermittently monitoring thyroid status            Each maintenance medication was reviewed in detail including emphasizing most importantly the difference between maintenance and prns and under what circumstances the prns are to be triggered using an action plan format where appropriate.  Total time for H and P, chart review, counseling, reviewing hfa device(s) and generating customized AVS unique to this office visit / same day charting = 33 min

## 2023-01-16 NOTE — Patient Instructions (Signed)
My office will be contacting you by phone for referral to ENT and sinus ct  - if you don't hear back from my office within one week please call us back or notify us thru MyChart and we'll address it right away.   Work on inhaler technique:  relax and gently blow all the way out then take a nice smooth full deep breath back in, triggering the inhaler at same time you start breathing in.  Hold breath in for at least  5 seconds if you can. Blow out symbicort  thru nose. Rinse and gargle with water when done.  If mouth or throat bother you at all,  try brushing teeth/gums/tongue with arm and hammer toothpaste/ make a slurry and gargle and spit out.   Keep previous appt, call sooner if needed

## 2023-01-16 NOTE — Assessment & Plan Note (Addendum)
Onset ? Age 62 with multiple reported allergies to abx Dr Truman Hayward Sinus surgery 1984 [facility] eval dust, ragweed  - CT sinus 01/16/2023  - referred to ENT 01/16/2023 >>>   In view of abx intol rec sinus ct/ ent eval rather than continuing levaquin, which was not effective in clearing her acute on chronic nasal and ear congestion and instead rec :  blow ICS out thru nose, irrigate with NS as much as possible and return to Dr Victorio Palm  group next availble  Keep annual  f/u in Nov 2024 as planned, call sooner if needed

## 2023-01-24 ENCOUNTER — Encounter: Payer: Self-pay | Admitting: Internal Medicine

## 2023-01-27 ENCOUNTER — Encounter: Payer: Self-pay | Admitting: Cardiology

## 2023-01-29 NOTE — Telephone Encounter (Signed)
Followed up with pt. She reports HRs are typically 90 bpm or below. The last week they have been running 100-100s. Asymptomatic. She is recovering from RSV 12/03/22 - 01/03/23. She is not currently taking Carvedilol d/t low BP. Inquired to if she is taking [place] like her message states -- pt is unsure. She is not at home to look at her medications for confirmation of what she is/is not taking. She will send a [employer] tonight with her med list. She understands this will help [place] make a more informed decision/advisement. Aware it will be Friday before she hears back from office on this matter. Pt agreeable to plan.

## 2023-02-03 ENCOUNTER — Ambulatory Visit
Admission: RE | Admit: 2023-02-03 | Discharge: 2023-02-03 | Disposition: A | Discharge status: Home / Self Care | Payer: No Typology Code available for payment source | Source: Ambulatory Visit | Attending: Internal Medicine | Admitting: Internal Medicine

## 2023-02-03 DIAGNOSIS — J329 Chronic sinusitis, unspecified: Secondary | ICD-10-CM

## 2023-02-17 DIAGNOSIS — J302 Other seasonal allergic rhinitis: Secondary | ICD-10-CM | POA: Insufficient documentation

## 2023-03-07 ENCOUNTER — Ambulatory Visit: Payer: No Typology Code available for payment source | Attending: Cardiology | Admitting: Cardiology

## 2023-03-07 ENCOUNTER — Encounter: Payer: Self-pay | Admitting: Cardiology

## 2023-03-07 VITALS — BP 120/70 | HR 86 | Ht 66.5 in | Wt 279.0 lb

## 2023-03-07 DIAGNOSIS — I493 Ventricular premature depolarization: Secondary | ICD-10-CM | POA: Diagnosis not present

## 2023-03-07 DIAGNOSIS — I10 Essential (primary) hypertension: Secondary | ICD-10-CM | POA: Diagnosis not present

## 2023-03-07 NOTE — Progress Notes (Signed)
Electrophysiology Office Note   Date:  03/07/2023   ID:  Kelly Thornton, Kelly Thornton 16-Jul-1961, MRN [id number]  PCP:  Lujean Amel, MD  Cardiologist:  Johnsie Cancel Primary Electrophysiologist:  Nathanie Ottley Meredith Leeds, MD    No chief complaint on file.     History of Present Illness: Kelly Thornton is a 62 y.o. female who is being seen today for the evaluation of PVCs at the request of Koirala, Dibas, MD. Presenting today for electrophysiology evaluation.    She has a history significant for asthma, diabetes, hypertension, hyperlipidemia, morbid obesity, Sjogren syndrome, fibromyalgia.  She was referred to cardiology clinic for PVCs.  She had a burden of 25%.  Echo showed a normal ejection fraction.  She was initially started on flecainide but did not tolerate this.  She has been switched to mexiletine with improvement in symptoms.  Today, denies symptoms of palpitations, chest pain, shortness of breath, orthopnea, PND, lower extremity edema, claudication, dizziness, presyncope, syncope, bleeding, or neurologic sequela. The patient is tolerating medications without difficulties.  Since being seen she has done well.  She has had no chest pain or shortness of breath.  Stable to all of her daily activities.  She has been losing weight and has been on Ozempic.  She is hopeful to get her weight to a level that she can have knee surgery.  She was also diagnosed with apparently ischemic bowel at colonoscopy.  She has been referred to vascular surgery.   Past Medical History:  Diagnosis Date   Abnormal uterine bleeding    Anxiety    Arthritis    Asthma    Cataract    Chronic interstitial cystitis    Cough variant asthma 04/02/2018   FENO 04/02/2018  =   11 on advair 250 one bid - Spirometry 04/02/2018  FEV1 2.39 (84%)  Ratio 87 with truncation of peak flow p advair prior  - 04/02/2018  After extensive coaching inhaler device  effectiveness =    75% from a baseline of 50% so try symbicort 80  1-2 bid      Depression    Diabetes mellitus without complication (HCC)    Dyspnea    Endometriosis    Fibromyalgia    GERD (gastroesophageal reflux disease)    Hearing loss    Hyperlipidemia    Hypertension    Hypothyroidism    Irritable bowel syndrome (IBS)    Meniere disease    Bilateral   Morbid obesity ([place])    Nonsustained ventricular tachycardia (HCC)    PONV (postoperative nausea and vomiting)    Premature ventricular contractions (PVCs) (V[facility])    Seasonal allergies    Sjogren's disease ([place])    Thyroid disease    Wears hearing aid in both ears    Past Surgical History:  Procedure Laterality Date   BIOPSY  08/15/2020   Procedure: BIOPSY;  Surgeon: Wilford Corner, MD;  Location: [facility];  Service: Endoscopy;;   bladder stretching     COLONOSCOPY WITH PROPOFOL N/A 08/15/2020   Procedure: COLONOSCOPY WITH PROPOFOL;  Surgeon: Wilford Corner, MD;  Location: [facility];  Service: Endoscopy;  Laterality: N/A;   CYSTOSCOPY     DIAGNOSTIC LAPAROSCOPY     x4   DILATION AND CURETTAGE OF UTERUS     ENDOMETRIAL BIOPSY     EXCISION OF TONGUE LESION Left 10/02/2018   Procedure: EXCISION OF TONGUE LESION;  Surgeon: [name], MD;  Location: [facility];  Service: ENT;  Laterality: Left;   EYE  SURGERY     NASAL SEPTUM SURGERY     STAPEDECTOMY     TONSILLECTOMY AND ADENOIDECTOMY     TUBAL LIGATION     WISDOM TOOTH EXTRACTION       Current Outpatient Medications  Medication Sig Dispense Refill   albuterol (PROVENTIL HFA;VENTOLIN HFA) 108 (90 BASE) MCG/ACT inhaler Inhale 2 puffs into the lungs every 6 (six) hours as needed for wheezing or shortness of breath.     albuterol (PROVENTIL) (2.5 MG/3ML) 0.083% nebulizer solution Take 2.5 mg by nebulization every 6 (six) hours as needed for wheezing or shortness of breath.     alclomethasone (ACLOVATE) 0.05 % cream Apply topically 2 (two) times daily as needed (Rash). For face. 180 g 3   amitriptyline (ELAVIL) 100 MG tablet Take 100 mg  by mouth at bedtime.     budesonide-formoterol (SYMBICORT) 80-4.5 MCG/ACT inhaler Take 2 puffs first thing in am and then another 2 puffs about 12 hours later. 1 each 12   carvedilol (COREG) 12.5 MG tablet Take 1 tablet (12.5 mg total) by mouth 2 (two) times daily. 180 tablet 1   cetirizine (ZYRTEC) 10 MG tablet Take 10 mg by mouth daily.     Cholecalciferol (VITAMIN D) 2000 units tablet Take 2,000 Units by mouth daily.      ciclopirox (LOPROX) 0.77 % SUSP Apply 1 application. topically 2 (two) times daily. Q am 60 mL 6   clobetasol (TEMOVATE) 0.05 % external solution Apply 1 application. topically 2 (two) times daily. For scalp at night- not for face. 50 mL 6   clotrimazole-betamethasone (LOTRISONE) cream Apply 1 Application topically 2 (two) times daily.     cromolyn (NASALCROM) 5.2 MG/ACT nasal spray Place 1 spray into both nostrils in the morning and at bedtime.     desvenlafaxine (PRISTIQ) 100 MG 24 hr tablet Take 100 mg by mouth daily.     diphenhydrAMINE (BENADRYL) 25 MG tablet Take 25 mg by mouth at bedtime.     EPINEPHrine 0.3 mg/0.3 mL IJ SOAJ injection Inject 0.3 mg into the muscle as needed for anaphylaxis ("from seafood").      Eyelid Cleansers (AVENOVA) 0.01 % SOLN Place 1 application into both eyes 2 (two) times daily.     fluticasone (FLONASE) 50 MCG/ACT nasal spray Place 2 sprays into both nostrils at bedtime.     irbesartan (AVAPRO) 75 MG tablet Take 75 mg by mouth daily.     levothyroxine (SYNTHROID, LEVOTHROID) 200 MCG tablet Take 200 mcg by mouth daily before breakfast.      metFORMIN (GLUCOPHAGE-XR) 500 MG 24 hr tablet Take 500 mg by mouth at bedtime.      mexiletine (MEXITIL) 150 MG capsule TAKE 1 CAPSULE BY MOUTH  TWICE DAILY 180 capsule 1   Multiple Vitamin (MULTIVITAMIN) capsule Take 1 capsule by mouth daily.     NASAL SALINE NA Place 2 sprays into both nostrils daily as needed (for dryness or congestion).      nystatin-triamcinolone ointment (MYCOLOG) Apply 1  Application topically 2 (two) times daily. 60 g 2   OVER THE COUNTER MEDICATION Take 1 capsule by mouth at bedtime. Peptiva probiotic     pentosan polysulfate (ELMIRON) 100 MG capsule Take 200 mg by mouth 2 (two) times daily.     promethazine-dextromethorphan (PROMETHAZINE-DM) 6.25-15 MG/5ML syrup Take 5 mLs by mouth 4 (four) times daily as needed for cough. 118 mL 0   rosuvastatin (CRESTOR) 5 MG tablet Take 5 mg by mouth at bedtime.  Semaglutide,0.25 or 0.5MG /DOS, (OZEMPIC, 0.25 OR 0.5 MG/DOSE,) 2 MG/1.5ML SOPN      triamterene-hydrochlorothiazide (MAXZIDE-25) 37.5-25 MG tablet Take 1 tablet by mouth daily.     predniSONE (DELTASONE) 10 MG tablet 4 tabs for 2 days, then 3 tabs for 2 days, 2 tabs for 2 days, then 1 tab for 2 days, then stop (Patient not taking: Reported on 03/07/2023) 20 tablet 0   No current facility-administered medications for this visit.    Allergies:   Isopropyl alcohol, Mineral oil, Monosodium glutamate, Nickel, Other, Pneumovax [pneumococcal polysaccharide vaccine], Procaine, Rubbing alcohol [alcohol], Shellfish allergy, Silicone dioxide [silica], Ace inhibitors, Avelox [moxifloxacin hcl in nacl], Cephalosporins, Chocolate, Clofexamide, Cocoa, Codeine, Darvon [propoxyphene], Flavoring agent, Fluorescein-benoxinate, Hydrocodone-acetaminophen, Lisinopril, Silicone, Simvastatin, Tape, Tetracyclines & related, Bextra [valdecoxib], Biaxin [clarithromycin], Ceclor [cefaclor], Doxycycline, Erythromycin, Hibiclens [chlorhexidine gluconate], Keflex [cephalexin], Penicillins, Povidone-iodine, and Thimerosal (thiomersal)   Social History:  The patient  reports that she has never smoked. She has never used smokeless tobacco. She reports that she does not currently use alcohol. She reports that she does not use drugs.   Family History:  The patient's family history includes Breast cancer in her maternal aunt and maternal grandmother; Cancer in an other family member; Heart disease in an  other family member; Hyperlipidemia in an other family member; Hypertension in an other family member; Sleep apnea in an other family member.   ROS:  Please see the history of present illness.   Otherwise, review of systems is positive for none.   All other systems are reviewed and negative.   PHYSICAL EXAM: VS:  BP 120/70   Pulse 86   Ht 5' 6.5" (1.689 m)   Wt 279 lb (126.6 kg)   SpO2 99%   BMI 44.36 kg/m  , BMI Body mass index is 44.36 kg/m. GEN: Well nourished, well developed, in no acute distress  HEENT: normal  Neck: no JVD, carotid bruits, or masses Cardiac: RRR; no murmurs, rubs, or gallops,no edema  Respiratory:  clear to auscultation bilaterally, normal work of breathing GI: soft, nontender, nondistended, + BS MS: no deformity or atrophy  Skin: warm and dry Neuro:  Strength and sensation are intact Psych: euthymic mood, full affect  EKG:  EKG is ordered today. Personal review of the ekg ordered shows sinus rhythm with PACs, low voltage  Recent Labs: No results found for requested labs within last 365 days.    Lipid Panel  No results found for: "CHOL", "TRIG", "HDL", "CHOLHDL", "VLDL", "LDLCALC", "LDLDIRECT"   Wt Readings from Last 3 Encounters:  03/07/23 279 lb (126.6 kg)  01/16/23 284 lb 6.4 oz (129 kg)  01/02/23 287 lb 3.2 oz (130.3 kg)      Other studies Reviewed: Additional studies/ records that were reviewed today include: TTE 01/16/18  Review of the above records today demonstrates:  - Left ventricle: The cavity size was normal. Wall thickness was   increased in a pattern of mild LVH. Systolic function was normal.   The estimated ejection fraction was in the range of 55% to 60%.   The study is not technically sufficient to allow evaluation of LV   diastolic function.  Holter 01/04/18 - personally reviewed NSR Average HR 101 range 78-141 bpm Frequent PVCls and NSVT longest range 12 beats Ventricular ectopy 25% total beats Refer to EP for suppressive  Rx   ASSESSMENT AND PLAN:  1.  PVCs: Burden of 25%.  Currently on mexiletine.  Did not tolerate flecainide.  PVC burden significantly reduced.  Continue with current management.  2.  Hypertension: Currently well-controlled  3.  Hyperlipidemia: Continue Crestor per primary care    Current medicines are reviewed at length with the patient today.   The patient does not have concerns regarding her medicines.  The following changes were made today: None  Labs/ tests ordered today include:  Orders Placed This Encounter  Procedures   EKG 12-Lead     Disposition:   FU 12 months  Signed, Halyn Flaugher Meredith Leeds, MD  03/07/2023 8:31 AM     [facility] [address] [place] [address] [phone] (office) [phone] (fax)

## 2023-03-07 NOTE — Patient Instructions (Signed)
Medication Instructions:  ?Your physician recommends that you continue on your current medications as directed. Please refer to the Current Medication list given to you today. ? ?*If you need a refill on your cardiac medications before your next appointment, please call your pharmacy* ? ? ?Lab Work: ?None ordered ? ? ?Testing/Procedures: ?None ordered ? ? ?Follow-Up: ?At [facility], you and your health needs are our priority.  As part of our continuing mission to provide you with exceptional heart care, we have created designated Provider Care Teams.  These Care Teams include your primary Cardiologist (physician) and Advanced Practice Providers (APPs -  Physician Assistants and Nurse Practitioners) who all work together to provide you with the care you need, when you need it. ? ?Your next appointment:   ?1 year(s) ? ?The format for your next appointment:   ?In Person ? ?Provider:   ?You will see one of the following Advanced Practice Providers on your designated Care Team:   ?Renee Ursuy, PA-C ?Michael "Andy" Tillery, PA-C ? ? ? ? ?Thank you for choosing [facility]!! ? ? ?Andrez Lieurance, RN ?[phone] ? ?

## 2023-04-02 ENCOUNTER — Other Ambulatory Visit: Payer: Self-pay

## 2023-04-02 DIAGNOSIS — K559 Vascular disorder of intestine, unspecified: Secondary | ICD-10-CM

## 2023-04-11 ENCOUNTER — Ambulatory Visit (INDEPENDENT_AMBULATORY_CARE_PROVIDER_SITE_OTHER): Payer: No Typology Code available for payment source | Admitting: Vascular Surgery

## 2023-04-11 ENCOUNTER — Encounter: Payer: Self-pay | Admitting: Vascular Surgery

## 2023-04-11 ENCOUNTER — Ambulatory Visit (HOSPITAL_COMMUNITY)
Admission: RE | Admit: 2023-04-11 | Discharge: 2023-04-11 | Disposition: A | Discharge status: Home / Self Care | Payer: No Typology Code available for payment source | Source: Ambulatory Visit | Attending: Vascular Surgery

## 2023-04-11 VITALS — BP 109/67 | HR 77 | Temp 97.2°F | Resp 16 | Ht 66.5 in | Wt 273.7 lb

## 2023-04-11 DIAGNOSIS — K559 Vascular disorder of intestine, unspecified: Secondary | ICD-10-CM

## 2023-04-11 NOTE — Progress Notes (Signed)
Patient ID: Kelly Thornton, female   DOB: [birth year], 62 y.o.   MRN: [id number]  Reason for Consult: New Patient (Initial Visit) (Ischemic colitis)   Referred by Charlott Rakes, MD  Subjective:     HPI:  Kelly Thornton is a 62 y.o. female with history of diabetes, hypertension and hyperlipidemia without any history of vascular disease including no strokes or TIA or amaurosis, no lower extremity symptoms of claudication or rest pain and no personal or family history of aneurysm disease.  More recently since about 2021 she has had recurrent bouts of ischemic colitis and also has postprandial pain which really is more of GI disturbance with vomiting and subsequent diarrhea she has not had any blood in her stool lately.  She has had significant weight loss in recent years this was intentional currently on Ozempic and hopes to get knee replacements.  Past Medical History:  Diagnosis Date   Abnormal uterine bleeding    Anxiety    Arthritis    Asthma    Cataract    Chronic interstitial cystitis    Cough variant asthma 04/02/2018   FENO 04/02/2018  =   11 on advair 250 one bid - Spirometry 04/02/2018  FEV1 2.39 (84%)  Ratio 87 with truncation of peak flow p advair prior  - 04/02/2018  After extensive coaching inhaler device  effectiveness =    75% from a baseline of 50% so try symbicort 80  1-2 bid     Depression    Diabetes mellitus without complication    Dyspnea    Endometriosis    Fibromyalgia    GERD (gastroesophageal reflux disease)    Hearing loss    Hyperlipidemia    Hypertension    Hypothyroidism    Irritable bowel syndrome (IBS)    Meniere disease    Bilateral   Morbid obesity    Nonsustained ventricular tachycardia    PONV (postoperative nausea and vomiting)    Premature ventricular contractions (PVCs) (VPCs)    Seasonal allergies    Sjogren's disease    Thyroid disease    Wears hearing aid in both ears    Family History  Problem Relation Age of Onset   Breast  cancer Maternal Aunt    Breast cancer Maternal Grandmother    Cancer Other    Hyperlipidemia Other    Hypertension Other    Heart disease Other    Sleep apnea Other    Past Surgical History:  Procedure Laterality Date   BIOPSY  08/15/2020   Procedure: BIOPSY;  Surgeon: Charlott Rakes, MD;  Location: [facility];  Service: Endoscopy;;   bladder stretching     COLONOSCOPY WITH PROPOFOL N/A 08/15/2020   Procedure: COLONOSCOPY WITH PROPOFOL;  Surgeon: Charlott Rakes, MD;  Location: [facility];  Service: Endoscopy;  Laterality: N/A;   CYSTOSCOPY     DIAGNOSTIC LAPAROSCOPY     x4   DILATION AND CURETTAGE OF UTERUS     ENDOMETRIAL BIOPSY     EXCISION OF TONGUE LESION Left 10/02/2018   Procedure: EXCISION OF TONGUE LESION;  Surgeon: Osborn Coho, MD;  Location: [facility];  Service: ENT;  Laterality: Left;   EYE SURGERY     NASAL SEPTUM SURGERY     STAPEDECTOMY     TONSILLECTOMY AND ADENOIDECTOMY     TUBAL LIGATION     WISDOM TOOTH EXTRACTION      Short Social History:  Social History   Tobacco Use   Smoking status: Never  Smokeless tobacco: Never  Substance Use Topics   Alcohol use: Not Currently    Allergies  Allergen Reactions   Isopropyl Alcohol Rash   Mineral Oil Rash   Monosodium Glutamate Nausea And Vomiting   Nickel Rash   Other Anaphylaxis and Other (See Comments)    Other reaction(s): Other (See Comments) gastritis redness PNEUMO VAX, RUBBING ALCOHOL, MINERAL OIL, NICKEL, MSG, SILICON- REACTION  gastritis redness PNEUMO VAX, RUBBING ALCOHOL, MINERAL OIL, NICKEL, MSG, SILICON- REACTION  GI upset   Pneumovax [Pneumococcal Polysaccharide Vaccine] Rash   Procaine Shortness Of Breath and Rash   Rubbing Alcohol [Alcohol] Other (See Comments)    "burns skin"    Shellfish Allergy Anaphylaxis   Silicone Dioxide [Silica] Anaphylaxis and Rash   Ace Inhibitors Swelling    Lips swell   Avelox [Moxifloxacin Hcl In Nacl] Other (See Comments)    Gastritis    Cephalosporins     Other reaction(s): Unknown   Chocolate Other (See Comments)    Sneezing   Clofexamide     Other reaction(s): Unknown   Cocoa Other (See Comments)    Sneezing   Codeine Nausea Only and Other (See Comments)    GI upset   Darvon [Propoxyphene] Other (See Comments)    GI Upset   Flavoring Agent     Other reaction(s): Unknown   Fluorescein-Benoxinate Other (See Comments)    Eye irritation   Hydrocodone-Acetaminophen Other (See Comments)    GI upset Other reaction(s): Unknown   Lisinopril Swelling    Tongue and lips became swollen   Silicone     Other reaction(s): Unknown   Simvastatin Other (See Comments)    Leg cramps   Tape Other (See Comments)    Redness Other reaction(s): Unknown   Tetracyclines & Related Other (See Comments)    GI upset   Bextra [Valdecoxib] Rash   Biaxin [Clarithromycin] Rash   Ceclor [Cefaclor] Rash   Doxycycline Rash   Erythromycin Rash   Hibiclens [Chlorhexidine Gluconate] Rash   Keflex [Cephalexin] Rash   Penicillins Rash    Did it involve swelling of the face/tongue/throat, SOB, or low BP? Yes Did it involve sudden or severe rash/hives, skin peeling, or any reaction on the inside of your mouth or nose? No Did you need to seek medical attention at a hospital or doctor's office? No When did it last happen? "I was a kid"   If all above answers are "NO", may proceed with cephalosporin use.    Povidone-Iodine Rash        Thimerosal (Thiomersal) Rash    Current Outpatient Medications  Medication Sig Dispense Refill   albuterol (PROVENTIL HFA;VENTOLIN HFA) 108 (90 BASE) MCG/ACT inhaler Inhale 2 puffs into the lungs every 6 (six) hours as needed for wheezing or shortness of breath.     albuterol (PROVENTIL) (2.5 MG/3ML) 0.083% nebulizer solution Take 2.5 mg by nebulization every 6 (six) hours as needed for wheezing or shortness of breath.     alclomethasone (ACLOVATE) 0.05 % cream Apply topically 2 (two) times daily as needed  (Rash). For face. 180 g 3   allopurinol (ZYLOPRIM) 300 MG tablet Take 300 mg by mouth daily.     amitriptyline (ELAVIL) 100 MG tablet Take 100 mg by mouth at bedtime.     budesonide-formoterol (SYMBICORT) 80-4.5 MCG/ACT inhaler Take 2 puffs first thing in am and then another 2 puffs about 12 hours later. 1 each 12   carvedilol (COREG) 12.5 MG tablet Take 1 tablet (12.5 mg total)  by mouth 2 (two) times daily. 180 tablet 1   cetirizine (ZYRTEC) 10 MG tablet Take 10 mg by mouth daily.     Cholecalciferol (VITAMIN D) 2000 units tablet Take 2,000 Units by mouth daily.      ciclopirox (LOPROX) 0.77 % SUSP Apply 1 application. topically 2 (two) times daily. Q am 60 mL 6   clobetasol (TEMOVATE) 0.05 % external solution Apply 1 application. topically 2 (two) times daily. For scalp at night- not for face. 50 mL 6   clotrimazole-betamethasone (LOTRISONE) cream Apply 1 Application topically 2 (two) times daily.     cromolyn (NASALCROM) 5.2 MG/ACT nasal spray Place 1 spray into both nostrils in the morning and at bedtime.     desvenlafaxine (PRISTIQ) 100 MG 24 hr tablet Take 100 mg by mouth daily.     diphenhydrAMINE (BENADRYL) 25 MG tablet Take 25 mg by mouth at bedtime.     EPINEPHrine 0.3 mg/0.3 mL IJ SOAJ injection Inject 0.3 mg into the muscle as needed for anaphylaxis ("from seafood").      Eyelid Cleansers (AVENOVA) 0.01 % SOLN Place 1 application into both eyes 2 (two) times daily.     fluticasone (FLONASE) 50 MCG/ACT nasal spray Place 2 sprays into both nostrils at bedtime.     irbesartan (AVAPRO) 75 MG tablet Take 75 mg by mouth daily.     mexiletine (MEXITIL) 150 MG capsule TAKE 1 CAPSULE BY MOUTH  TWICE DAILY 180 capsule 1   Multiple Vitamin (MULTIVITAMIN) capsule Take 1 capsule by mouth daily.     NASAL SALINE NA Place 2 sprays into both nostrils daily as needed (for dryness or congestion).      nystatin-triamcinolone ointment (MYCOLOG) Apply 1 Application topically 2 (two) times daily. 60 g 2    OVER THE COUNTER MEDICATION Take 1 capsule by mouth at bedtime. Peptiva probiotic     pentosan polysulfate (ELMIRON) 100 MG capsule Take 200 mg by mouth 2 (two) times daily.     rosuvastatin (CRESTOR) 5 MG tablet Take 5 mg by mouth at bedtime.      levothyroxine (SYNTHROID, LEVOTHROID) 200 MCG tablet Take 200 mcg by mouth daily before breakfast.      metFORMIN (GLUCOPHAGE-XR) 500 MG 24 hr tablet Take 500 mg by mouth at bedtime.      predniSONE (DELTASONE) 10 MG tablet 4 tabs for 2 days, then 3 tabs for 2 days, 2 tabs for 2 days, then 1 tab for 2 days, then stop (Patient not taking: Reported on 03/07/2023) 20 tablet 0   promethazine-dextromethorphan (PROMETHAZINE-DM) 6.25-15 MG/5ML syrup Take 5 mLs by mouth 4 (four) times daily as needed for cough. 118 mL 0   Semaglutide,0.25 or 0.5MG /DOS, (OZEMPIC, 0.25 OR 0.5 MG/DOSE,) 2 MG/1.5ML SOPN      triamterene-hydrochlorothiazide (MAXZIDE-25) 37.5-25 MG tablet Take 1 tablet by mouth daily.     No current facility-administered medications for this visit.    Review of Systems  Constitutional:       Intentional weight loss  HENT: HENT negative.  Eyes: Eyes negative.  Cardiovascular: Cardiovascular negative.  GI: Positive for abdominal pain, diarrhea, nausea and vomiting.  Musculoskeletal: Positive for joint pain.  Skin: Skin negative.  Neurological: Neurological negative. Hematologic: Hematologic/lymphatic negative.  Psychiatric: Psychiatric negative.        Objective:  Objective   Vitals:   04/11/23 0846  BP: 109/67  Pulse: 77  Resp: 16  Temp: (!) 97.2 F (36.2 C)  TempSrc: Temporal  SpO2: 98%  Weight: 273  lb 11.2 oz (124.1 kg)  Height: 5' 6.5" (1.689 m)   Body mass index is 43.51 kg/m.  Physical Exam HENT:     Head: Normocephalic.     Nose: Nose normal.  Eyes:     Pupils: Pupils are equal, round, and reactive to light.  Cardiovascular:     Rate and Rhythm: Normal rate.     Pulses: Normal pulses.  Pulmonary:     Effort:  Pulmonary effort is normal. No respiratory distress.  Abdominal:     General: Abdomen is flat.     Palpations: Abdomen is soft. There is no mass.     Tenderness: There is no abdominal tenderness. There is no guarding or rebound.  Musculoskeletal:        General: Normal range of motion.     Cervical back: Normal range of motion.     Right lower leg: No edema.     Left lower leg: No edema.  Skin:    General: Skin is warm.     Capillary Refill: Capillary refill takes less than 2 seconds.  Neurological:     General: No focal deficit present.     Mental Status: She is alert.  Psychiatric:        Mood and Affect: Mood normal.        Thought Content: Thought content normal.        Judgment: Judgment normal.     Data: Duplex Findings:  +--------------------+--------+--------+------+--------------+  Mesenteric         PSV cm/sEDV cm/sPlaque   Comments     +--------------------+--------+--------+------+--------------+  Aorta at SMA          141                                 +--------------------+--------+--------+------+--------------+  Celiac Artery Origin  291                                 +--------------------+--------+--------+------+--------------+  SMA Origin            226                                 +--------------------+--------+--------+------+--------------+  SMA Proximal          180                                 +--------------------+--------+--------+------+--------------+  SMA Mid               181                                 +--------------------+--------+--------+------+--------------+  SMA Distal            158                                 +--------------------+--------+--------+------+--------------+  CHA                                      not visualized  +--------------------+--------+--------+------+--------------+  Splenic  not visualized   +--------------------+--------+--------+------+--------------+  IMA                  137                                 +--------------------+--------+--------+------+--------------+           Summary:  Mesenteric:  Normal Superior Mesenteric artery and Inferior Mesenteric artery findings.  70 to 99% stenosis in the celiac artery.      Assessment/Plan:    62 year old female with multiple bouts of recurrent ischemic colitis and with postprandial pain although not necessarily typical for pain from chronic mesenteric ischemia.  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  Given her ongoing symptoms I recommended CT with contrast to evaluate her mesenteric vessels and I will see her back after this.     Maeola Harman MD [facility]

## 2023-04-12 ENCOUNTER — Other Ambulatory Visit: Payer: Self-pay | Admitting: Cardiovascular Disease

## 2023-04-17 ENCOUNTER — Encounter: Payer: Self-pay | Admitting: Vascular Surgery

## 2023-04-17 DIAGNOSIS — I7300 Raynaud's syndrome without gangrene: Secondary | ICD-10-CM | POA: Insufficient documentation

## 2023-04-29 ENCOUNTER — Encounter: Payer: Self-pay | Admitting: Vascular Surgery

## 2023-04-30 ENCOUNTER — Other Ambulatory Visit: Payer: Self-pay

## 2023-04-30 DIAGNOSIS — K559 Vascular disorder of intestine, unspecified: Secondary | ICD-10-CM

## 2023-05-07 ENCOUNTER — Ambulatory Visit (INDEPENDENT_AMBULATORY_CARE_PROVIDER_SITE_OTHER): Payer: No Typology Code available for payment source | Admitting: Vascular Surgery

## 2023-05-07 ENCOUNTER — Encounter: Payer: Self-pay | Admitting: Vascular Surgery

## 2023-05-07 VITALS — BP 120/55 | HR 55 | Temp 97.9°F | Resp 20 | Ht 66.5 in | Wt 270.0 lb

## 2023-05-07 DIAGNOSIS — K559 Vascular disorder of intestine, unspecified: Secondary | ICD-10-CM

## 2023-05-07 NOTE — Progress Notes (Signed)
Patient ID: Kelly Thornton, female   DOB: 1961/04/26, 62 y.o.   MRN: [id number]  Reason for Consult: No chief complaint on file.   Referred by Kelly Bussing, MD  Subjective:     HPI:  Kelly Thornton is a 62 y.o. female With history of hypertension, diabetes and recently diagnosed with recurrent ischemic colitis. Initial concerns for celiac artery and possible SMA stenosis by duplex she now returns with CT scan for closer evaluation.  She has not had any issues since our last visit.  Past Medical History:  Diagnosis Date   Abnormal uterine bleeding    Anxiety    Arthritis    Asthma    Cataract    Chronic interstitial cystitis    Cough variant asthma 04/02/2018   FENO 04/02/2018  =   11 on advair 250 one bid - Spirometry 04/02/2018  FEV1 2.39 (84%)  Ratio 87 with truncation of peak flow p advair prior  - 04/02/2018  After extensive coaching inhaler device  effectiveness =    75% from a baseline of 50% so try symbicort 80  1-2 bid     Depression    Diabetes mellitus without complication (HCC)    Dyspnea    Endometriosis    Fibromyalgia    GERD (gastroesophageal reflux disease)    Hearing loss    Hyperlipidemia    Hypertension    Hypothyroidism    Irritable bowel syndrome (IBS)    Meniere disease    Bilateral   Morbid obesity (HCC)    Nonsustained ventricular tachycardia (HCC)    PONV (postoperative nausea and vomiting)    Premature ventricular contractions (PVCs) (VPCs)    Seasonal allergies    Sjogren's disease (HCC)    Thyroid disease    Wears hearing aid in both ears    Family History  Problem Relation Age of Onset   Breast cancer Maternal Aunt    Breast cancer Maternal Grandmother    Cancer Other    Hyperlipidemia Other    Hypertension Other    Heart disease Other    Sleep apnea Other    Past Surgical History:  Procedure Laterality Date   BIOPSY  08/15/2020   Procedure: BIOPSY;  Surgeon: Charlott Rakes, MD;  Location: [facility];  Service: Endoscopy;;    bladder stretching     COLONOSCOPY WITH PROPOFOL N/A 08/15/2020   Procedure: COLONOSCOPY WITH PROPOFOL;  Surgeon: Charlott Rakes, MD;  Location: [facility];  Service: Endoscopy;  Laterality: N/A;   CYSTOSCOPY     DIAGNOSTIC LAPAROSCOPY     x4   DILATION AND CURETTAGE OF UTERUS     ENDOMETRIAL BIOPSY     EXCISION OF TONGUE LESION Left 10/02/2018   Procedure: EXCISION OF TONGUE LESION;  Surgeon: Osborn Coho, MD;  Location: [facility] OR;  Service: ENT;  Laterality: Left;   EYE SURGERY     NASAL SEPTUM SURGERY     STAPEDECTOMY     TONSILLECTOMY AND ADENOIDECTOMY     TUBAL LIGATION     WISDOM TOOTH EXTRACTION      Short Social History:  Social History   Tobacco Use   Smoking status: Never   Smokeless tobacco: Never  Substance Use Topics   Alcohol use: Not Currently    Allergies  Allergen Reactions   Isopropyl Alcohol Rash   Mineral Oil Rash   Monosodium Glutamate Nausea And Vomiting   Nickel Rash   Other Anaphylaxis and Other (See Comments)    Other reaction(s): Other (  See Comments) gastritis redness PNEUMO VAX, RUBBING ALCOHOL, MINERAL OIL, NICKEL, MSG, SILICON- REACTION  gastritis redness PNEUMO VAX, RUBBING ALCOHOL, MINERAL OIL, NICKEL, MSG, SILICON- REACTION  GI upset   Pneumovax [Pneumococcal Polysaccharide Vaccine] Rash   Procaine Shortness Of Breath and Rash   Rubbing Alcohol [Alcohol] Other (See Comments)    "burns skin"    Shellfish Allergy Anaphylaxis   Silicone Dioxide [Silica] Anaphylaxis and Rash   Ace Inhibitors Swelling    Lips swell   Avelox [Moxifloxacin Hcl In Nacl] Other (See Comments)    Gastritis   Cephalosporins     Other reaction(s): Unknown   Chocolate Other (See Comments)    Sneezing   Clofexamide     Other reaction(s): Unknown   Cocoa Other (See Comments)    Sneezing   Codeine Nausea Only and Other (See Comments)    GI upset   Darvon [Propoxyphene] Other (See Comments)    GI Upset   Flavoring Agent     Other reaction(s):  Unknown   Fluorescein-Benoxinate Other (See Comments)    Eye irritation   Hydrocodone-Acetaminophen Other (See Comments)    GI upset Other reaction(s): Unknown   Lisinopril Swelling    Tongue and lips became swollen   Silicone     Other reaction(s): Unknown   Simvastatin Other (See Comments)    Leg cramps   Tape Other (See Comments)    Redness Other reaction(s): Unknown   Tetracyclines & Related Other (See Comments)    GI upset   Bextra [Valdecoxib] Rash   Biaxin [Clarithromycin] Rash   Ceclor [Cefaclor] Rash   Doxycycline Rash   Erythromycin Rash   Hibiclens [Chlorhexidine Gluconate] Rash   Keflex [Cephalexin] Rash   Penicillins Rash    Did it involve swelling of the face/tongue/throat, SOB, or low BP? Yes Did it involve sudden or severe rash/hives, skin peeling, or any reaction on the inside of your mouth or nose? No Did you need to seek medical attention at a hospital or doctor's office? No When did it last happen? "I was a kid"   If all above answers are "NO", may proceed with cephalosporin use.    Povidone-Iodine Rash        Thimerosal (Thiomersal) Rash    Current Outpatient Medications  Medication Sig Dispense Refill   albuterol (PROVENTIL HFA;VENTOLIN HFA) 108 (90 BASE) MCG/ACT inhaler Inhale 2 puffs into the lungs every 6 (six) hours as needed for wheezing or shortness of breath.     albuterol (PROVENTIL) (2.5 MG/3ML) 0.083% nebulizer solution Take 2.5 mg by nebulization every 6 (six) hours as needed for wheezing or shortness of breath.     alclomethasone (ACLOVATE) 0.05 % cream Apply topically 2 (two) times daily as needed (Rash). For face. 180 g 3   allopurinol (ZYLOPRIM) 300 MG tablet Take 300 mg by mouth daily.     amitriptyline (ELAVIL) 100 MG tablet Take 100 mg by mouth at bedtime.     budesonide-formoterol (SYMBICORT) 80-4.5 MCG/ACT inhaler Take 2 puffs first thing in am and then another 2 puffs about 12 hours later. 1 each 12   carvedilol (COREG) 12.5 MG  tablet Take 1 tablet (12.5 mg total) by mouth 2 (two) times daily. 180 tablet 1   cetirizine (ZYRTEC) 10 MG tablet Take 10 mg by mouth daily.     Cholecalciferol (VITAMIN D) 2000 units tablet Take 2,000 Units by mouth daily.      ciclopirox (LOPROX) 0.77 % SUSP Apply 1 application. topically 2 (two) times  daily. Q am 60 mL 6   clobetasol (TEMOVATE) 0.05 % external solution Apply 1 application. topically 2 (two) times daily. For scalp at night- not for face. 50 mL 6   clotrimazole-betamethasone (LOTRISONE) cream Apply 1 Application topically 2 (two) times daily.     cromolyn (NASALCROM) 5.2 MG/ACT nasal spray Place 1 spray into both nostrils in the morning and at bedtime.     desvenlafaxine (PRISTIQ) 100 MG 24 hr tablet Take 100 mg by mouth daily.     diphenhydrAMINE (BENADRYL) 25 MG tablet Take 25 mg by mouth at bedtime.     EPINEPHrine 0.3 mg/0.3 mL IJ SOAJ injection Inject 0.3 mg into the muscle as needed for anaphylaxis ("from seafood").      Eyelid Cleansers (AVENOVA) 0.01 % SOLN Place 1 application into both eyes 2 (two) times daily.     fluticasone (FLONASE) 50 MCG/ACT nasal spray Place 2 sprays into both nostrils at bedtime.     irbesartan (AVAPRO) 75 MG tablet Take 75 mg by mouth daily.     levothyroxine (SYNTHROID, LEVOTHROID) 200 MCG tablet Take 200 mcg by mouth daily before breakfast.      metFORMIN (GLUCOPHAGE-XR) 500 MG 24 hr tablet Take 500 mg by mouth at bedtime.      mexiletine (MEXITIL) 150 MG capsule TAKE 1 CAPSULE BY MOUTH TWICE  DAILY 180 capsule 3   Multiple Vitamin (MULTIVITAMIN) capsule Take 1 capsule by mouth daily.     NASAL SALINE NA Place 2 sprays into both nostrils daily as needed (for dryness or congestion).      nystatin-triamcinolone ointment (MYCOLOG) Apply 1 Application topically 2 (two) times daily. 60 g 2   OVER THE COUNTER MEDICATION Take 1 capsule by mouth at bedtime. Peptiva probiotic     pentosan polysulfate (ELMIRON) 100 MG capsule Take 200 mg by mouth 2  (two) times daily.     predniSONE (DELTASONE) 10 MG tablet 4 tabs for 2 days, then 3 tabs for 2 days, 2 tabs for 2 days, then 1 tab for 2 days, then stop (Patient not taking: Reported on 03/07/2023) 20 tablet 0   promethazine-dextromethorphan (PROMETHAZINE-DM) 6.25-15 MG/5ML syrup Take 5 mLs by mouth 4 (four) times daily as needed for cough. 118 mL 0   rosuvastatin (CRESTOR) 5 MG tablet Take 5 mg by mouth at bedtime.      Semaglutide,0.25 or 0.5MG /DOS, (OZEMPIC, 0.25 OR 0.5 MG/DOSE,) 2 MG/1.5ML SOPN      triamterene-hydrochlorothiazide (MAXZIDE-25) 37.5-25 MG tablet Take 1 tablet by mouth daily.     No current facility-administered medications for this visit.    Review of Systems  Constitutional:  Constitutional negative. HENT: HENT negative.  Eyes: Eyes negative.  Cardiovascular: Cardiovascular negative.  GI: Positive for abdominal pain, diarrhea and nausea.  Musculoskeletal: Musculoskeletal negative.  Skin: Skin negative.  Neurological: Neurological negative. Hematologic: Hematologic/lymphatic negative.  Psychiatric: Psychiatric negative.        Objective:  Objective  Vitals:   05/07/23 1517  BP: (!) 120/55  Pulse: (!) 55  Resp: 20  Temp: 97.9 F (36.6 C)  SpO2: 94%    Physical Exam Constitutional:      Appearance: She is obese.  HENT:     Head: Normocephalic.     Nose: Nose normal.     Mouth/Throat:     Mouth: Mucous membranes are moist.  Eyes:     Pupils: Pupils are equal, round, and reactive to light.  Cardiovascular:     Rate and Rhythm: Normal rate.  Pulmonary:     Effort: Pulmonary effort is normal.  Abdominal:     General: Abdomen is flat.     Palpations: Abdomen is soft. There is no mass.  Musculoskeletal:     Cervical back: Normal range of motion and neck supple.     Right lower leg: No edema.     Left lower leg: No edema.  Skin:    General: Skin is warm.     Capillary Refill: Capillary refill takes less than 2 seconds.  Neurological:      General: No focal deficit present.     Mental Status: She is alert.  Psychiatric:        Mood and Affect: Mood normal.        Thought Content: Thought content normal.        Judgment: Judgment normal.     Data: CT scan and read reviewed with patient.  There is a left 3 cm adrenal adenoma which can be followed by her primary care doctor.  There is no apparent stenosis of her proximal celiac artery, SMA or IMA.     Assessment/Plan:    62 year old female with recurrent ischemic colitis with concern for large vessel ischemia by duplex not confirmed with CT.  She can see me on an as-needed basis and should follow-up with her primary care doctor regarding 3 cm adrenal adenoma.      Maeola Harman MD [facility]

## 2023-05-08 ENCOUNTER — Ambulatory Visit: Payer: No Typology Code available for payment source | Admitting: Physician Assistant

## 2023-05-13 ENCOUNTER — Other Ambulatory Visit: Payer: Self-pay | Admitting: *Deleted

## 2023-05-13 DIAGNOSIS — I10 Essential (primary) hypertension: Secondary | ICD-10-CM

## 2023-05-13 MED ORDER — CARVEDILOL 12.5 MG PO TABS
12.5000 mg | ORAL_TABLET | Freq: Two times a day (BID) | ORAL | 3 refills | Status: DC
Start: 2023-05-13 — End: 2023-06-23

## 2023-06-01 ENCOUNTER — Other Ambulatory Visit: Payer: Self-pay | Admitting: Cardiology

## 2023-06-01 DIAGNOSIS — I10 Essential (primary) hypertension: Secondary | ICD-10-CM

## 2023-06-21 ENCOUNTER — Other Ambulatory Visit: Payer: Self-pay | Admitting: Cardiology

## 2023-06-21 DIAGNOSIS — I10 Essential (primary) hypertension: Secondary | ICD-10-CM

## 2023-10-17 IMAGING — CR DG ABDOMEN 1V
2 series · 2 of 2 positions shown · non-contrast
Comparison: Chest radiograph dated February 06, 2019; lumbar spine
radiograph dated October 24, 2016

CLINICAL DATA: LOOSE STOOLS;LLQ PAIN;comment on stool burden

EXAM:
ABDOMEN - 1 VIEW

[t abdomen supine (1 of 2)]
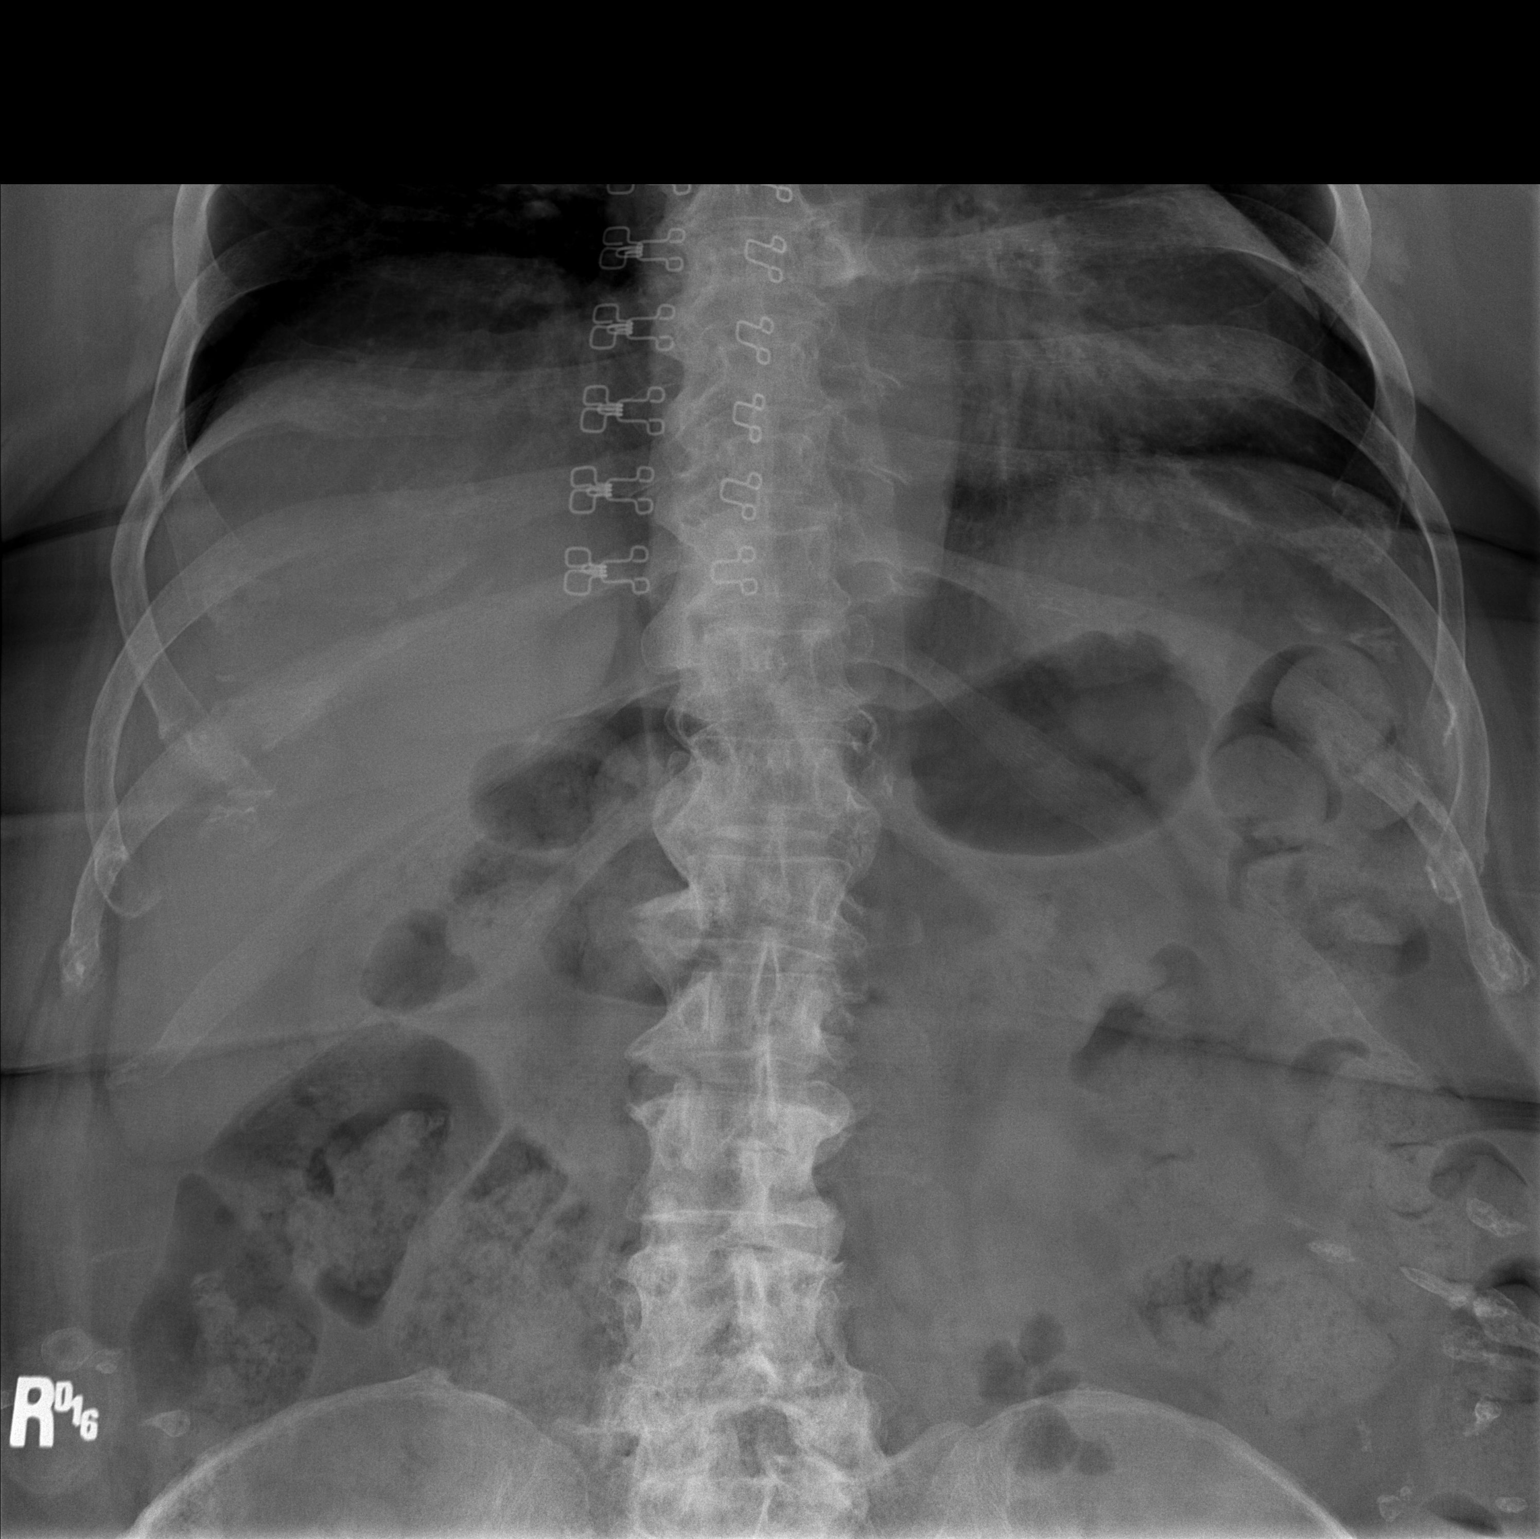

[t abdomen supine (2 of 2)]
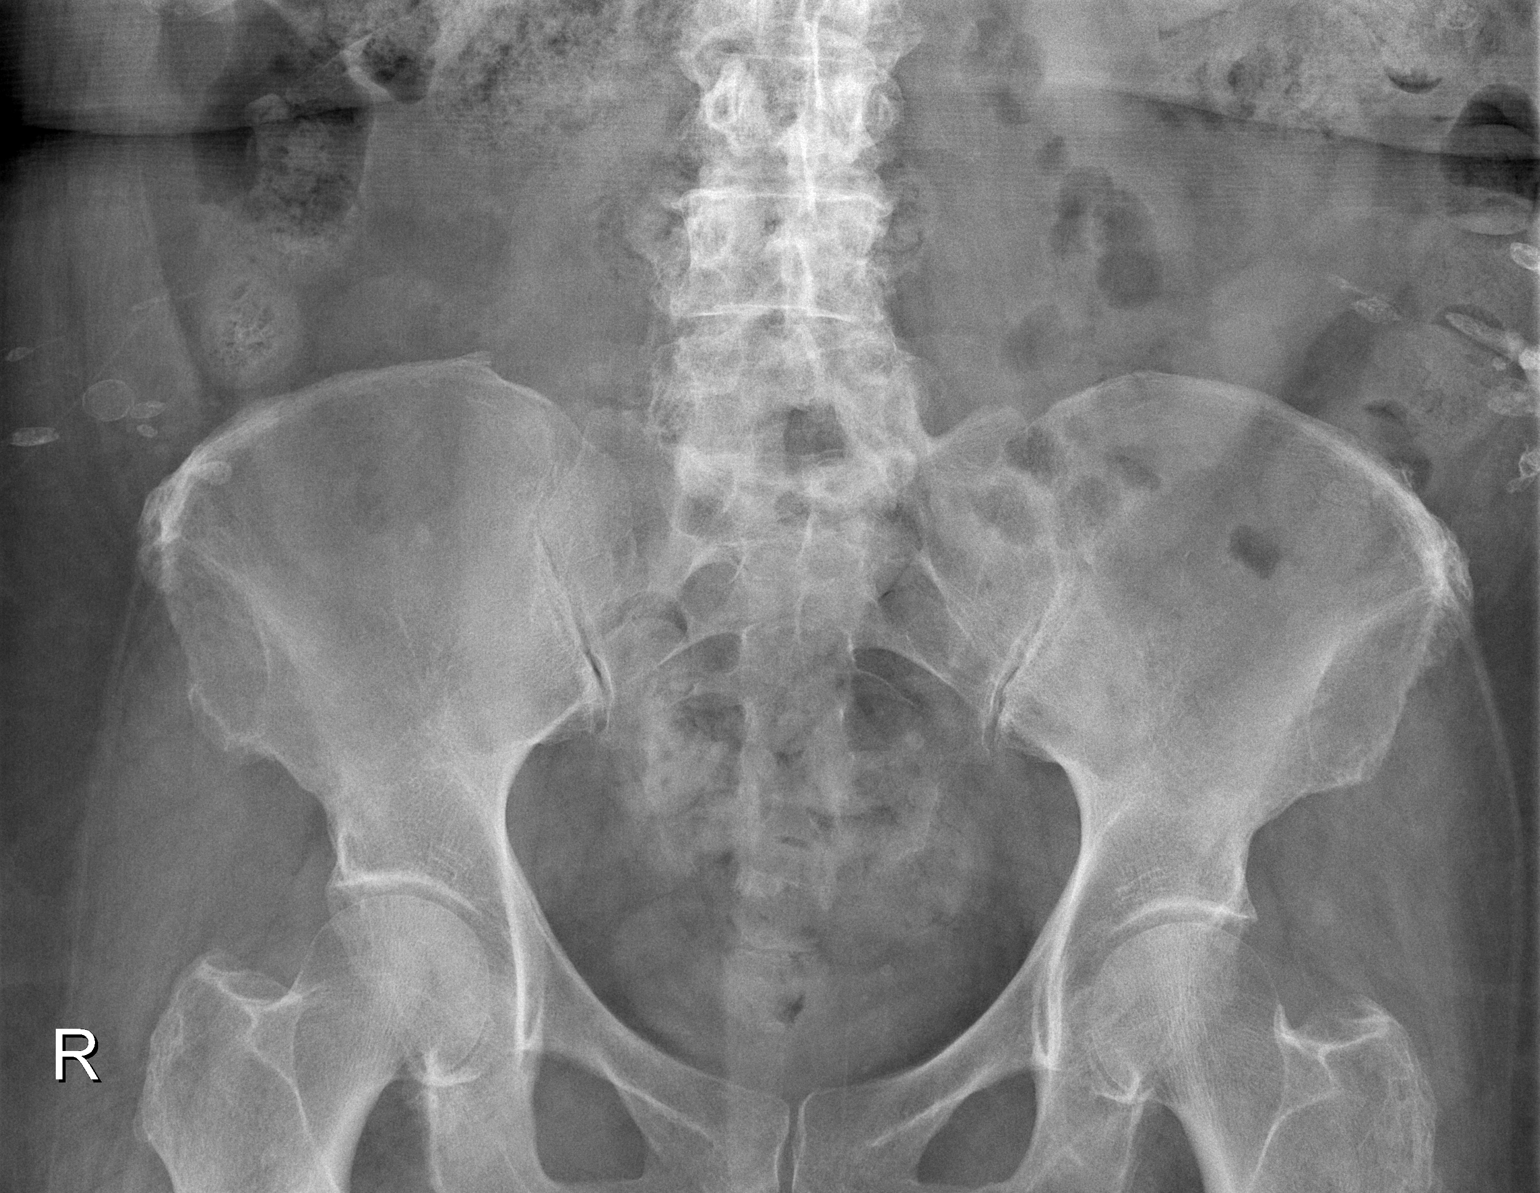

[2 of 2 positions shown; findings below may reference images not displayed]

FINDINGS: Air and stool-filled nondilated loops of bowel. Moderate colonic
stool burden diffusely throughout the colon. Visualized lung bases
are unremarkable. Degenerative changes of the lumbar spine.
Calcifications of the lateral flanks likely reflecting sequela of
remote prior soft tissue trauma.
IMPRESSION: Nonobstructive bowel gas pattern.

## 2023-11-06 NOTE — Progress Notes (Signed)
Sent message, via epic in basket, requesting orders in epic from surgeon.  

## 2023-11-07 ENCOUNTER — Other Ambulatory Visit: Payer: Self-pay | Admitting: Urology

## 2023-11-08 ENCOUNTER — Encounter: Payer: Self-pay | Admitting: Cardiology

## 2023-11-10 ENCOUNTER — Telehealth: Payer: Self-pay | Admitting: Cardiology

## 2023-11-10 ENCOUNTER — Telehealth: Payer: Self-pay | Admitting: *Deleted

## 2023-11-10 NOTE — Telephone Encounter (Signed)
   Name: Kelly Thornton  DOB: 1961-11-26  MRN: [id number]  Primary Cardiologist: None   Preoperative team, please contact this patient and set up a phone call appointment for further preoperative risk assessment. Please obtain consent and complete medication review. Thank you for your help.  None  I also confirmed the patient resides in the state of [place]. As per [facility] telemedicine laws, the patient must reside in the state in which the provider is licensed.   Napoleon Form, Leodis Rains, NP 11/10/2023, 9:05 AM [facility]

## 2023-11-10 NOTE — Telephone Encounter (Signed)
PREOP APP has reviewed and pt will need a tele appt for preop clearance.

## 2023-11-10 NOTE — Telephone Encounter (Signed)
   Pre-operative Risk Assessment    Patient Name: Kelly Thornton  DOB: 08-May-1961 MRN: [id number]      Request for Surgical Clearance    Procedure:   Left Robotic Adrenalectomy   Date of Surgery:  Clearance 11/18/23                                 Surgeon: Dr. Liliane Shi  Surgeon's Group or Practice Name: [facility] Phone number: [phone] Fax number: [phone]   Type of Clearance Requested:   - Medical    Type of Anesthesia:  General    Additional requests/questions:      [name], [name]   11/10/2023, 8:51 AM

## 2023-11-10 NOTE — Telephone Encounter (Signed)
Pt has been scheduled tele pre op appt 11/11/23 @ 3:20 , ok per Bernadene Person, NP to add on due to procedure date. Med rec and consent are done,

## 2023-11-10 NOTE — Telephone Encounter (Signed)
Pt has been scheduled tele pre op appt 11/11/23 @ 3:20 , ok per Bernadene Person, NP to add on due to procedure date. Med rec and consent are done,     Patient Consent for Virtual Visit        Kelly Thornton has provided verbal consent on 11/10/2023 for a virtual visit (video or telephone).   CONSENT FOR VIRTUAL VISIT FOR:  Kelly Thornton  By participating in this virtual visit I agree to the following:  I hereby voluntarily request, consent and authorize [facility] and its employed or contracted physicians, physician assistants, nurse practitioners or other licensed health care professionals (the Practitioner), to provide me with telemedicine health care services (the "Services") as deemed necessary by the treating Practitioner. I acknowledge and consent to receive the Services by the Practitioner via telemedicine. I understand that the telemedicine visit will involve communicating with the Practitioner through live audiovisual communication technology and the disclosure of certain medical information by electronic transmission. I acknowledge that I have been given the opportunity to request an in-person assessment or other available alternative prior to the telemedicine visit and am voluntarily participating in the telemedicine visit.  I understand that I have the right to withhold or withdraw my consent to the use of telemedicine in the course of my care at any time, without affecting my right to future care or treatment, and that the Practitioner or I may terminate the telemedicine visit at any time. I understand that I have the right to inspect all information obtained and/or recorded in the course of the telemedicine visit and may receive copies of available information for a reasonable fee.  I understand that some of the potential risks of receiving the Services via telemedicine include:  Delay or interruption in medical evaluation due to technological equipment failure or  disruption; Information transmitted may not be sufficient (e.g. poor resolution of images) to allow for appropriate medical decision making by the Practitioner; and/or  In rare instances, security protocols could fail, causing a breach of personal health information.  Furthermore, I acknowledge that it is my responsibility to provide information about my medical history, conditions and care that is complete and accurate to the best of my ability. I acknowledge that Practitioner's advice, recommendations, and/or decision may be based on factors not within their control, such as incomplete or inaccurate data provided by me or distortions of diagnostic images or specimens that may result from electronic transmissions. I understand that the practice of medicine is not an exact science and that Practitioner makes no warranties or guarantees regarding treatment outcomes. I acknowledge that a copy of this consent can be made available to me via my patient portal [facility] MyChart), or I can request a printed copy by calling the office of  [facility].    I understand that my insurance will be billed for this visit.   I have read or had this consent read to me. I understand the contents of this consent, which adequately explains the benefits and risks of the Services being provided via telemedicine.  I have been provided ample opportunity to ask questions regarding this consent and the Services and have had my questions answered to my satisfaction. I give my informed consent for the services to be provided through the use of telemedicine in my medical care

## 2023-11-11 ENCOUNTER — Ambulatory Visit: Payer: No Typology Code available for payment source | Attending: Cardiology | Admitting: Cardiology

## 2023-11-11 ENCOUNTER — Encounter: Payer: Self-pay | Admitting: Cardiology

## 2023-11-11 DIAGNOSIS — Z01810 Encounter for preprocedural cardiovascular examination: Secondary | ICD-10-CM | POA: Diagnosis not present

## 2023-11-11 NOTE — Progress Notes (Signed)
Virtual Visit via Telephone Note   Because of Kelly Thornton's co-morbid illnesses, she is at least at moderate risk for complications without adequate follow up.  This format is felt to be most appropriate for this patient at this time.  The patient did not have access to video technology/had technical difficulties with video requiring transitioning to audio format only (telephone).  All issues noted in this document were discussed and addressed.  No physical exam could be performed with this format.  Please refer to the patient's chart for her consent to telehealth for [facility].  Evaluation Performed:  Preoperative cardiovascular risk assessment _____________   Date:  11/11/2023   Patient ID:  Kelly Thornton, Kelly Thornton 08/13/1961, MRN [id number] Patient Location:  Home Provider location:   Office  Primary Care Provider:  Darrow Bussing, MD Primary Cardiologist:  Dr. Loman Brooklyn   Chief Complaint / Patient Profile  62 y.o. y/o female with a h/o PVCs, asthma, diabetes, hypertension, hyperlipidemia, obesity, Sjogren syndrome and fibromyalgia who is pending left robotic adrenalectomy with Dr. Liliane Shi and presents today for telephonic preoperative cardiovascular risk assessment. History of Present Illness   Kelly Thornton is a 62 y.o. female who presents via audio/video conferencing for a telehealth visit today.  Pt was last seen in cardiology clinic on 03/07/23 by Dr. Loman Brooklyn. At that time Kelly Thornton was doing well.  The patient is now pending procedure as outlined above. Since her last visit, she has remained stable from a cardiac perspective. She denies chest pain, palpitations, dyspnea, pnd, orthopnea, n, v, dizziness, syncope, edema, weight gain, or early satiety.   Past Medical History    Past Medical History:  Diagnosis Date   Abnormal uterine bleeding    Anxiety    Arthritis    Asthma    Cataract    Chronic interstitial cystitis    Cough variant asthma  04/02/2018   FENO 04/02/2018  =   11 on advair 250 one bid - Spirometry 04/02/2018  FEV1 2.39 (84%)  Ratio 87 with truncation of peak flow p advair prior  - 04/02/2018  After extensive coaching inhaler device  effectiveness =    75% from a baseline of 50% so try symbicort 80  1-2 bid     Depression    Diabetes mellitus without complication (HCC)    Dyspnea    Endometriosis    Fibromyalgia    GERD (gastroesophageal reflux disease)    Hearing loss    Hyperlipidemia    Hypertension    Hypothyroidism    Irritable bowel syndrome (IBS)    Meniere disease    Bilateral   Morbid obesity (HCC)    Nonsustained ventricular tachycardia (HCC)    PONV (postoperative nausea and vomiting)    Premature ventricular contractions (PVCs) (VPCs)    Seasonal allergies    Sjogren's disease (HCC)    Thyroid disease    Thornton hearing aid in both ears    Past Surgical History:  Procedure Laterality Date   BIOPSY  08/15/2020   Procedure: BIOPSY;  Surgeon: Charlott Rakes, MD;  Location: [facility];  Service: Endoscopy;;   bladder stretching     COLONOSCOPY WITH PROPOFOL N/A 08/15/2020   Procedure: COLONOSCOPY WITH PROPOFOL;  Surgeon: Charlott Rakes, MD;  Location: [facility];  Service: Endoscopy;  Laterality: N/A;   CYSTOSCOPY     DIAGNOSTIC LAPAROSCOPY     x4   DILATION AND CURETTAGE OF UTERUS     ENDOMETRIAL BIOPSY  EXCISION OF TONGUE LESION Left 10/02/2018   Procedure: EXCISION OF TONGUE LESION;  Surgeon: Osborn Coho, MD;  Location: [facility];  Service: ENT;  Laterality: Left;   EYE SURGERY     NASAL SEPTUM SURGERY     STAPEDECTOMY     TONSILLECTOMY AND ADENOIDECTOMY     TUBAL LIGATION     WISDOM TOOTH EXTRACTION     Allergies Allergies  Allergen Reactions   Isopropyl Alcohol Rash   Mineral Oil Rash   Monosodium Glutamate Nausea And Vomiting   Nickel Rash   Other Anaphylaxis and Other (See Comments)    Other reaction(s): Other (See Comments) gastritis redness PNEUMO VAX, RUBBING  ALCOHOL, MINERAL OIL, NICKEL, MSG, SILICON- REACTION  gastritis redness PNEUMO VAX, RUBBING ALCOHOL, MINERAL OIL, NICKEL, MSG, SILICON- REACTION  GI upset   Pneumovax [Pneumococcal Polysaccharide Vaccine] Rash   Procaine Shortness Of Breath and Rash   Rubbing Alcohol [Alcohol] Other (See Comments)    "burns skin"    Shellfish Allergy Anaphylaxis   Silicone Dioxide [Silica] Anaphylaxis and Rash   Ace Inhibitors Swelling    Lips swell   Avelox [Moxifloxacin Hcl In Nacl] Other (See Comments)    Gastritis   Cephalosporins     Other reaction(s): Unknown   Chocolate Other (See Comments)    Sneezing   Clofexamide     Other reaction(s): Unknown   Cocoa Other (See Comments)    Sneezing   Codeine Nausea Only and Other (See Comments)    GI upset   Darvon [Propoxyphene] Other (See Comments)    GI Upset   Flavoring Agent     Other reaction(s): Unknown   Fluorescein-Benoxinate Other (See Comments)    Eye irritation   Hydrocodone-Acetaminophen Other (See Comments)    GI upset *Vicodin    Lisinopril Swelling    Tongue and lips became swollen   Silicone     Other reaction(s): Unknown   Simvastatin Other (See Comments)    Leg cramps   Tape Other (See Comments)    Redness Other reaction(s): Unknown   Tetracyclines & Related Other (See Comments)    GI upset   Bextra [Valdecoxib] Rash   Biaxin [Clarithromycin] Rash   Ceclor [Cefaclor] Rash   Doxycycline Rash   Erythromycin Rash   Hibiclens [Chlorhexidine Gluconate] Rash   Keflex [Cephalexin] Rash   Penicillins Rash    Did it involve swelling of the face/tongue/throat, SOB, or low BP? Yes Did it involve sudden or severe rash/hives, skin peeling, or any reaction on the inside of your mouth or nose? No Did you need to seek medical attention at a hospital or doctor's office? No When did it last happen? "I was a kid"   If all above answers are "NO", may proceed with cephalosporin use.    Povidone-Iodine Rash        Thimerosal  (Thiomersal) Rash    Home Medications    Prior to Admission medications   Medication Sig Start Date End Date Taking? Authorizing Provider  albuterol (PROVENTIL HFA;VENTOLIN HFA) 108 (90 BASE) MCG/ACT inhaler Inhale 2 puffs into the lungs every 6 (six) hours as needed for wheezing or shortness of breath.    [provider]  albuterol (PROVENTIL) (2.5 MG/3ML) 0.083% nebulizer solution Take 2.5 mg by nebulization every 6 (six) hours as needed for wheezing or shortness of breath.    [provider]  allopurinol (ZYLOPRIM) 300 MG tablet Take 300 mg by mouth daily. 04/07/23   [provider]  amitriptyline Caleb Popp)  100 MG tablet Take 100 mg by mouth at bedtime.    [provider]  budesonide-formoterol (SYMBICORT) 80-4.5 MCG/ACT inhaler Take 2 puffs first thing in am and then another 2 puffs about 12 hours later. Patient taking differently: Inhale 1 puff into the lungs in the morning and at bedtime. 11/18/22   Nyoka Cowden, MD  carboxymethylcellulose 1 % ophthalmic solution Place 1 drop into both eyes 2 (two) times daily.    [provider]  carvedilol (COREG) 12.5 MG tablet TAKE 1 TABLET BY MOUTH TWICE  DAILY 06/23/23   Camnitz, Andree Coss, MD  cetirizine (ZYRTEC) 10 MG tablet Take 10 mg by mouth 2 (two) times daily.    [provider]  Cholecalciferol (VITAMIN D) 2000 units tablet Take 2,000 Units by mouth daily.     [provider]  ciclopirox (LOPROX) 0.77 % SUSP Apply 1 application. topically 2 (two) times daily. Q am Patient taking differently: Apply 1 application  topically 2 (two) times daily as needed (irritation). Q am 05/02/22   Mackey Birchwood R, PA-C  clobetasol (TEMOVATE) 0.05 % external solution Apply 1 application. topically 2 (two) times daily. For scalp at night- not for face. Patient taking differently: Apply 1 application  topically 2 (two) times daily as needed (irritation). For scalp at night- not for face. 05/02/22    Sheffield, Judye Bos, PA-C  cromolyn (NASALCROM) 5.2 MG/ACT nasal spray Place 1 spray into both nostrils in the morning and at bedtime.    [provider]  desvenlafaxine (PRISTIQ) 100 MG 24 hr tablet Take 100 mg by mouth daily. 11/12/22   [provider]  docusate sodium (COLACE) 100 MG capsule Take 100 mg by mouth 2 (two) times daily.    [provider]  EPINEPHrine 0.3 mg/0.3 mL IJ SOAJ injection Inject 0.3 mg into the muscle as needed for anaphylaxis ("from seafood").     [provider]  Eyelid Cleansers (AVENOVA) 0.01 % SOLN Place 1 application into both eyes 2 (two) times daily.    [provider]  fluticasone (FLONASE) 50 MCG/ACT nasal spray Place 2 sprays into both nostrils at bedtime.    [provider]  irbesartan (AVAPRO) 75 MG tablet Take 75 mg by mouth daily. 01/21/22   [provider]  levothyroxine (SYNTHROID) 175 MCG tablet Take 175 mcg by mouth daily before breakfast. 04/08/23   [provider]  mexiletine (MEXITIL) 150 MG capsule TAKE 1 CAPSULE BY MOUTH TWICE  DAILY 04/14/23   Camnitz, Andree Coss, MD  Multiple Vitamin (MULTIVITAMIN) capsule Take 1 capsule by mouth daily.    [provider]  NASAL SALINE NA Place 2 sprays into both nostrils daily as needed (for dryness or congestion).     [provider]  nystatin-triamcinolone ointment (MYCOLOG) Apply 1 Application topically 2 (two) times daily. Patient taking differently: Apply 1 Application topically 2 (two) times daily as needed (irritation). 11/12/22   Olivia Mackie, NP  Pentosan Polysulfate Sodium 200 MG CPDR Take 200 mg by mouth 2 (two) times daily.    [provider]  rosuvastatin (CRESTOR) 5 MG tablet Take 5 mg by mouth at bedtime.     [provider]  Semaglutide, 1 MG/DOSE, 2 MG/1.5ML SOPN Inject 2 mg into the skin once a week. 02/02/23   [provider]  Simethicone (GAS-X ULTRA STRENGTH PO) Take 250 mg  by mouth in the morning and at bedtime.    [provider]  triamterene-hydrochlorothiazide (MAXZIDE-25) 37.5-25 MG  tablet Take 1 tablet by mouth daily.    [provider]    Physical Exam    Vital Signs:  Kelly Thornton does not have vital signs available for review today.  Given telephonic nature of communication, physical exam is limited. AAOx3. NAD. Normal affect.  Speech and respirations are unlabored.  Accessory Clinical Findings   None Assessment & Plan    1.  Preoperative Cardiovascular Risk Assessment: Left robotic adrenalectomy with Dr. Liliane Shi   Kelly Thornton's perioperative risk of a major cardiac event is 0.4% according to the Revised Cardiac Risk Index (RCRI).  Therefore, she is at low risk for perioperative complications.  Her functional capacity is good at 5.72 METs according to the Duke Activity Status Index (DASI). Recommendations: According to ACC/AHA guidelines, no further cardiovascular testing needed.  The patient may proceed to surgery at acceptable risk.    The patient was advised that if she develops new symptoms prior to surgery to contact our office to arrange for a follow-up visit, and she verbalized understanding.  A copy of this note will be routed to requesting surgeon.  Time:   Today, I have spent 5 minutes with the patient with telehealth technology discussing medical history, symptoms, and management plan.    Rip Harbour, NP  11/11/2023, 3:38 PM

## 2023-11-12 NOTE — Patient Instructions (Signed)
DUE TO COVID-19 ONLY TWO VISITORS  (aged 64 and older)  ARE ALLOWED TO COME WITH YOU AND STAY IN THE WAITING ROOM ONLY DURING PRE OP AND PROCEDURE.   **NO VISITORS ARE ALLOWED IN THE SHORT STAY AREA OR RECOVERY ROOM!!**  IF YOU WILL BE ADMITTED INTO THE HOSPITAL YOU ARE ALLOWED ONLY FOUR SUPPORT PEOPLE DURING VISITATION HOURS ONLY (7 AM -8PM)   The support person(s) must pass our screening, gel in and out, and wear a mask at all times, including in the patient's room. Patients must also wear a mask when staff or their support person are in the room. Visitors GUEST BADGE MUST BE WORN VISIBLY  One adult visitor may remain with you overnight and MUST be in the room by 8 P.M.     Your procedure is scheduled on: 11/18/23   Report to [facility] Main Entrance    Report to admitting at : 5:15AM   Call this number if you have problems the morning of surgery [phone]   Clear liquids starting the day before surgery until : 4:30 AM DAY OF SURGERY  Water Black Coffee (sugar ok, NO MILK/CREAM OR CREAMERS)  Tea (sugar ok, NO MILK/CREAM OR CREAMERS) regular and decaf                             Plain Jell-O (NO RED)                                           Fruit ices (not with fruit pulp, NO RED)                                     Popsicles (NO RED)                                                                  Juice: apple, WHITE grape, WHITE cranberry Sports drinks like Gatorade (NO RED)              FOLLOW ANY ADDITIONAL PRE OP INSTRUCTIONS YOU RECEIVED FROM YOUR SURGEON'S OFFICE!!!   Oral Hygiene is also important to reduce your risk of infection.                                    Remember - BRUSH YOUR TEETH THE MORNING OF SURGERY WITH YOUR REGULAR TOOTHPASTE  DENTURES WILL BE REMOVED PRIOR TO SURGERY PLEASE DO NOT APPLY "Poly grip" OR ADHESIVES!!!   Do NOT smoke after Midnight   Take these medicines the morning of surgery with A SIP OF WATER:  mexiletin,desvenlafaxine,cetirizine,carvedilol,allopurinol,Elmiron,simethicone,levothyroxine.Use inhalers,eye drops and nasal spray as usual.  How to Manage Your Diabetes Before and After Surgery  Why is it important to control my blood sugar before and after surgery? Improving blood sugar levels before and after surgery helps healing and can limit problems. A way of improving blood sugar control is eating a healthy diet by:  Eating less sugar and carbohydrates  Increasing activity/exercise  Talking with your doctor about reaching your blood sugar goals High blood sugars (greater than 180 mg/dL) can raise your risk of infections and slow your recovery, so you will need to focus on controlling your diabetes during the weeks before surgery. Make sure that the doctor who takes care of your diabetes knows about your planned surgery including the date and location.  How do I manage my blood sugar before surgery? Check your blood sugar at least 4 times a day, starting 2 days before surgery, to make sure that the level is not too high or low. Check your blood sugar the morning of your surgery when you wake up and every 2 hours until you get to the Short Stay unit. If your blood sugar is less than 70 mg/dL, you will need to treat for low blood sugar: Do not take insulin. Treat a low blood sugar (less than 70 mg/dL) with  cup of clear juice (cranberry or apple), 4 glucose tablets, OR glucose gel. Recheck blood sugar in 15 minutes after treatment (to make sure it is greater than 70 mg/dL). If your blood sugar is not greater than 70 mg/dL on recheck, call [phone] for further instructions. Report your blood sugar to the short stay nurse when you get to Short Stay.  If you are admitted to the hospital after surgery: Your blood sugar will be checked by the staff and you will probably be given insulin after surgery (instead of oral diabetes medicines) to make sure you have good blood sugar  levels. The goal for blood sugar control after surgery is 80-180 mg/dL.   WHAT DO I DO ABOUT MY DIABETES MEDICATION?  THE MORNING OF SURGERY, DO NOT TAKE ANY ORAL DIABETIC MEDICATIONS DAY OF YOUR SURGERY  DO NOT TAKE THE FOLLOWING 7 DAYS PRIOR TO SURGERY: Ozempic, Wegovy, Rybelsus (Semaglutide), Byetta (exenatide), Bydureon (exenatide ER), Victoza, Saxenda (liraglutide), or Trulicity (dulaglutide) Mounjaro (Tirzepatide) Adlyxin (Lixisenatide), Polyethylene Glycol Loxenatide. HOLD semaglutide after: 11/10/23  Bring CPAP mask and tubing day of surgery.                              You may not have any metal on your body including hair pins, jewelry, and body piercing             Do not wear make-up, lotions, powders, perfumes/cologne, or deodorant  Do not wear nail polish including gel and S&S, artificial/acrylic nails, or any other type of covering on natural nails including finger and toenails. If you have artificial nails, gel coating, etc. that needs to be removed by a nail salon please have this removed prior to surgery or surgery may need to be canceled/ delayed if the surgeon/ anesthesia feels like they are unable to be safely monitored.   Do not shave  48 hours prior to surgery.    Do not bring valuables to the hospital. [facility] IS NOT             RESPONSIBLE   FOR VALUABLES.   Contacts, glasses, or bridgework may not be worn into surgery.   Bring small overnight bag day of surgery.   DO NOT BRING YOUR HOME MEDICATIONS TO THE HOSPITAL. PHARMACY WILL DISPENSE MEDICATIONS LISTED ON YOUR MEDICATION LIST TO YOU DURING YOUR ADMISSION IN THE HOSPITAL!    Patients discharged on the day of surgery will not be allowed to drive home.  Someone NEEDS to stay with you  for the first 24 hours after anesthesia.   Special Instructions: Bring a copy of your healthcare power of attorney and living will documents         the day of surgery if you haven't scanned them before.               Please read over the following fact sheets you were given: IF YOU HAVE QUESTIONS ABOUT YOUR PRE-OP INSTRUCTIONS PLEASE CALL [phone]    [facility] - Preparing for Surgery Before surgery, you can play an important role.  Because skin is not sterile, your skin needs to be as free of germs as possible.  You can reduce the number of germs on your skin by washing with CHG (chlorahexidine gluconate) soap before surgery.  CHG is an antiseptic cleaner which kills germs and bonds with the skin to continue killing germs even after washing. Please DO NOT use if you have an allergy to CHG or antibacterial soaps.  If your skin becomes reddened/irritated stop using the CHG and inform your nurse when you arrive at Short Stay. Do not shave (including legs and underarms) for at least 48 hours prior to the first CHG shower.  You may shave your face/neck. Please follow these instructions carefully:  1.  Shower with CHG Soap the night before surgery and the  morning of Surgery.  2.  If you choose to wash your hair, wash your hair first as usual with your  normal  shampoo.  3.  After you shampoo, rinse your hair and body thoroughly to remove the  shampoo.                           4.  Use CHG as you would any other liquid soap.  You can apply chg directly  to the skin and wash                       Gently with a scrungie or clean washcloth.  5.  Apply the CHG Soap to your body ONLY FROM THE NECK DOWN.   Do not use on face/ open                           Wound or open sores. Avoid contact with eyes, ears mouth and genitals (private parts).                       Wash face,  Genitals (private parts) with your normal soap.             6.  Wash thoroughly, paying special attention to the area where your surgery  will be performed.  7.  Thoroughly rinse your body with warm water from the neck down.  8.  DO NOT shower/wash with your normal soap after using and rinsing off  the CHG Soap.                9.  Pat yourself dry  with a clean towel.            10.  Wear clean pajamas.            11.  Place clean sheets on your bed the night of your first shower and do not  sleep with pets. Day of Surgery : Do not apply any lotions/deodorants the morning of surgery.  Please wear clean clothes to the hospital/surgery  center.  FAILURE TO FOLLOW THESE INSTRUCTIONS MAY RESULT IN THE CANCELLATION OF YOUR SURGERY PATIENT SIGNATURE_________________________________  NURSE SIGNATURE__________________________________  ________________________________________________________________________

## 2023-11-13 ENCOUNTER — Encounter (HOSPITAL_COMMUNITY)
Admission: RE | Admit: 2023-11-13 | Discharge: 2023-11-13 | Disposition: A | Discharge status: Home / Self Care | Payer: No Typology Code available for payment source | Source: Ambulatory Visit | Attending: Urology | Admitting: Urology

## 2023-11-13 ENCOUNTER — Other Ambulatory Visit: Payer: Self-pay

## 2023-11-13 ENCOUNTER — Encounter (HOSPITAL_COMMUNITY): Payer: Self-pay

## 2023-11-13 VITALS — BP 148/74 | HR 90 | Temp 98.5°F | Ht 67.0 in | Wt 270.0 lb

## 2023-11-13 DIAGNOSIS — E119 Type 2 diabetes mellitus without complications: Secondary | ICD-10-CM | POA: Insufficient documentation

## 2023-11-13 DIAGNOSIS — I472 Ventricular tachycardia, unspecified: Secondary | ICD-10-CM | POA: Insufficient documentation

## 2023-11-13 DIAGNOSIS — Z6841 Body Mass Index (BMI) 40.0 and over, adult: Secondary | ICD-10-CM | POA: Diagnosis not present

## 2023-11-13 DIAGNOSIS — E249 Cushing's syndrome, unspecified: Secondary | ICD-10-CM | POA: Insufficient documentation

## 2023-11-13 DIAGNOSIS — M797 Fibromyalgia: Secondary | ICD-10-CM | POA: Insufficient documentation

## 2023-11-13 DIAGNOSIS — I493 Ventricular premature depolarization: Secondary | ICD-10-CM | POA: Diagnosis not present

## 2023-11-13 DIAGNOSIS — Z01812 Encounter for preprocedural laboratory examination: Secondary | ICD-10-CM | POA: Diagnosis present

## 2023-11-13 DIAGNOSIS — I10 Essential (primary) hypertension: Secondary | ICD-10-CM | POA: Diagnosis not present

## 2023-11-13 DIAGNOSIS — E039 Hypothyroidism, unspecified: Secondary | ICD-10-CM | POA: Diagnosis not present

## 2023-11-13 DIAGNOSIS — Z9109 Other allergy status, other than to drugs and biological substances: Secondary | ICD-10-CM | POA: Diagnosis not present

## 2023-11-13 DIAGNOSIS — J45909 Unspecified asthma, uncomplicated: Secondary | ICD-10-CM | POA: Diagnosis not present

## 2023-11-13 DIAGNOSIS — K219 Gastro-esophageal reflux disease without esophagitis: Secondary | ICD-10-CM | POA: Insufficient documentation

## 2023-11-13 DIAGNOSIS — M3500 Sicca syndrome, unspecified: Secondary | ICD-10-CM | POA: Insufficient documentation

## 2023-11-13 DIAGNOSIS — Z79899 Other long term (current) drug therapy: Secondary | ICD-10-CM | POA: Insufficient documentation

## 2023-11-13 HISTORY — DX: Ventricular premature depolarization: I49.3

## 2023-11-13 HISTORY — DX: Pneumonia, unspecified organism: J18.9

## 2023-11-13 HISTORY — DX: Gout, unspecified: M10.9

## 2023-11-13 HISTORY — DX: Pituitary-dependent Cushing's disease: E24.0

## 2023-11-13 HISTORY — DX: Cystitis, unspecified without hematuria: N30.90

## 2023-11-13 HISTORY — DX: Pure hypercholesterolemia, unspecified: E78.00

## 2023-11-13 HISTORY — DX: Vascular disorder of intestine, unspecified: K55.9

## 2023-11-13 HISTORY — DX: Cardiac arrhythmia, unspecified: I49.9

## 2023-11-13 LAB — COMPREHENSIVE METABOLIC PANEL
ALT: 17 U/L (ref 0–44)
AST: 17 U/L (ref 15–41)
Albumin: 3.9 g/dL (ref 3.5–5.0)
Alkaline Phosphatase: 81 U/L (ref 38–126)
Anion gap: 9 (ref 5–15)
BUN: 11 mg/dL (ref 8–23)
CO2: 26 mmol/L (ref 22–32)
Calcium: 9.5 mg/dL (ref 8.9–10.3)
Chloride: 102 mmol/L (ref 98–111)
Creatinine, Ser: 0.7 mg/dL (ref 0.44–1.00)
GFR, Estimated: >60 mL/min (ref >60)
Glucose, Bld: 107 mg/dL — ABNORMAL HIGH (ref 70–99)
Potassium: 3.5 mmol/L (ref 3.5–5.1)
Sodium: 137 mmol/L (ref 135–145)
Total Bilirubin: 0.6 mg/dL (ref <1.2)
Total Protein: 6.4 g/dL — ABNORMAL LOW (ref 6.5–8.1)

## 2023-11-13 LAB — CBC
HCT: 41.7 % (ref 36.0–46.0)
Hemoglobin: 13.5 g/dL (ref 12.0–15.0)
MCH: 32.4 pg (ref 26.0–34.0)
MCHC: 32.4 g/dL (ref 30.0–36.0)
MCV: 100 fL (ref 80.0–100.0)
Platelets: 244 10*3/uL (ref 150–400)
RBC: 4.17 MIL/uL (ref 3.87–5.11)
RDW: 13.2 % (ref 11.5–15.5)
WBC: 6 10*3/uL (ref 4.0–10.5)
nRBC: 0 % (ref 0.0–0.2)

## 2023-11-13 LAB — GLUCOSE, CAPILLARY: Glucose-Capillary: 92 mg/dL (ref 70–99)

## 2023-11-13 LAB — HEMOGLOBIN A1C
Hgb A1c MFr Bld: 5.4 % (ref 4.8–5.6)
Mean Plasma Glucose: 108.28 mg/dL

## 2023-11-13 NOTE — Progress Notes (Signed)
For Anesthesia: PCP - Darrow Bussing, MD  Cardiologist - Regan Lemming, MD  Clearance: Reather Littler: NP: 11/11/23 Bowel Prep reminder: N/A  Chest x-ray -  EKG - 03/07/23 Stress Test -  ECHO - 01/16/18 Cardiac Cath -  Pacemaker/ICD device last checked: Pacemaker orders received: Device Rep notified:  Spinal Cord Stimulator: N/A  Sleep Study - N/A CPAP -   Fasting Blood Sugar - N/A Checks Blood Sugar ___0__ times a day Date and result of last Hgb A1c-   Last dose of GLP1 agonist- Semaglutide. GLP1 instructions: Last dose: 11/01/23  Last dose of SGLT-2 inhibitors- N/A SGLT-2 instructions:   Blood Thinner Instructions: N/A Aspirin Instructions: Last Dose:  Activity level: Can go up a flight of stairs and activities of daily living without stopping and without chest pain and/or shortness of breath   Able to exercise without chest pain and/or shortness of breath  Anesthesia review: Hx: DIA,HTN,PVC's,Sjogren's syndrome,allergic to Hibiclens.  Patient denies shortness of breath, fever, cough and chest pain at PAT appointment   Patient verbalized understanding of instructions that were given to them at the PAT appointment. Patient was also instructed that they will need to review over the PAT instructions again at home before surgery.

## 2023-11-14 ENCOUNTER — Encounter (HOSPITAL_COMMUNITY): Payer: Self-pay

## 2023-11-14 NOTE — Anesthesia Preprocedure Evaluation (Signed)
Anesthesia Evaluation  Patient identified by MRN, date of birth, ID band Patient awake    Reviewed: Allergy & Precautions, NPO status , Patient's Chart, lab work & pertinent test results, reviewed documented beta blocker date and time   History of Anesthesia Complications (+) PONV and history of anesthetic complications  Airway Mallampati: III  TM Distance: >3 FB     Dental  (+) Missing, Chipped, Dental Advisory Given   Pulmonary shortness of breath and with exertion, asthma , pneumonia, resolved   Pulmonary exam normal breath sounds clear to auscultation       Cardiovascular hypertension, Pt. on medications and Pt. on home beta blockers + DOE  Normal cardiovascular exam+ dysrhythmias Supra Ventricular Tachycardia  Rhythm:Regular Rate:Normal     Neuro/Psych  PSYCHIATRIC DISORDERS Anxiety Depression     Neuromuscular disease    GI/Hepatic Neg liver ROS,GERD  Medicated,,IBS   Endo/Other  diabetes, Well Controlled, Type 2Hypothyroidism  Class 3 obesityGLP-1 RA therapy Adrenal adenoma Cushing's disease  Renal/GU Renal InsufficiencyRenal disease   IC    Musculoskeletal  (+) Arthritis , Osteoarthritis,  Fibromyalgia -  Abdominal  (+) + obese  Peds  Hematology negative hematology ROS (+)   Anesthesia Other Findings   Reproductive/Obstetrics                              Anesthesia Physical Anesthesia Plan  ASA: 3  Anesthesia Plan: General   Post-op Pain Management: Ofirmev IV (intra-op)* and Dilaudid IV   Induction: Intravenous and Cricoid pressure planned  PONV Risk Score and Plan: Ondansetron and Dexamethasone  Airway Management Planned: Oral ETT  Additional Equipment: Arterial line  Intra-op Plan:   Post-operative Plan: Extubation in OR  Informed Consent: I have reviewed the patients History and Physical, chart, labs and discussed the procedure including the risks, benefits  and alternatives for the proposed anesthesia with the patient or authorized representative who has indicated his/her understanding and acceptance.     Dental advisory given  Plan Discussed with: CRNA and Anesthesiologist  Anesthesia Plan Comments: (See PAT note from 11/21 by Sherlie Ban PA-C )         Anesthesia Quick Evaluation

## 2023-11-14 NOTE — Progress Notes (Signed)
DISCUSSION: Kelly Thornton is a 62 year old female who presents to PAT prior to XI ROBOTIC LEFT ADRENALECTOMY on 11/18/2023 with Dr. Liliane Shi.  Past medical history significant for hypertension, frequent PVCs, NSVT, asthma, GERD, Sjogren's syndrome, Cushing's disease, diabetes, hypothyroidism, fibromyalgia, multiple allergies (36 listed), morbid obesity.  Patient is followed by cardiology for frequent PVCs and as SVT. She wore a monitor with a 25% burden and episodes of nonsustained VT for up to 12 beats.  Echo showed a normal ejection fraction.  She was started on flecainide but did not tolerate the medication.  She was switched to mexiletine with improvement in her symptoms.  Last seen in clinic on 03/07/2023.  Cleared for upcoming surgery:  "Ms. Happ's perioperative risk of a major cardiac event is 0.4% according to the Revised Cardiac Risk Index (RCRI).  Therefore, she is at low risk for perioperative complications.  Her functional capacity is good at 5.72 METs according to the Duke Activity Status Index (DASI). Recommendations: According to ACC/AHA guidelines, no further cardiovascular testing needed.  The patient may proceed to surgery at acceptable risk."  Patient follows with pulmonology for asthma chronic rhinitis.  Last seen by Dr. Sherene Sires on 01/16/2023.  Stable on inhalers and allergy medicine.  Patient follows with PCP and endocrinology.  Last seen by PCP on 11/07/2023.  Cleared for surgery:  "scheduled with left robotic adrenalectoly labs today no contracindications with a cane , she can walk 4 blocks, no SOB.  no chest pains she has hx of PVCs - is on mexiletine and carvedilol, no palpitations."  GLP1 instructions: Last dose: 11/01/23   VS: BP (!) 148/74   Pulse 90   Temp 36.9 C (Oral)   Ht 5\' 7"  (1.702 m)   Wt 122.5 kg   SpO2 100%   BMI 42.29 kg/m   PROVIDERS: PCP - Darrow Bussing, MD  Cardiologist - Regan Lemming, MD    LABS: Labs reviewed: Acceptable for  surgery. (all labs ordered are listed, but only abnormal results are displayed)  Labs Reviewed  COMPREHENSIVE METABOLIC PANEL - Abnormal; Notable for the following components:      Result Value   Glucose, Bld 107 (*)    Total Protein 6.4 (*)    All other components within normal limits  HEMOGLOBIN A1C  CBC  GLUCOSE, CAPILLARY     IMAGES:   EKG:   CV:  Echo 01/16/2018:  Study Conclusions  - Left ventricle: The cavity size was normal. Wall thickness was   increased in a pattern of mild LVH. Systolic function was normal.   The estimated ejection fraction was in the range of 55% to 60%.   The study is not technically sufficient to allow evaluation of LV   diastolic function.  Holter monitor 12/28/2018:  NSR Average HR 101 range 78-141 bpm Frequent PVCls and NSVT longest range 12 beats Ventricular ectopy 25% total beats Refer to EP for suppressive Rx  Past Medical History:  Diagnosis Date   Abnormal uterine bleeding    Anxiety    Arthritis    Asthma    Cataract    Chronic interstitial cystitis    Colitis, ischemic (HCC)    Cough variant asthma 04/02/2018   FENO 04/02/2018  =   11 on advair 250 one bid - Spirometry 04/02/2018  FEV1 2.39 (84%)  Ratio 87 with truncation of peak flow p advair prior  - 04/02/2018  After extensive coaching inhaler device  effectiveness =    75% from a baseline of 50%  so try symbicort 80  1-2 bid     Cushing disease (HCC)    Cystitis    Depression    Diabetes mellitus without complication (HCC)    Dyspnea    Dysrhythmia    Elevated cholesterol    Endometriosis    Fibromyalgia    GERD (gastroesophageal reflux disease)    Gout    Hearing loss    Hyperlipidemia    Hypertension    Hypothyroidism    Irritable bowel syndrome (IBS)    Meniere disease    Bilateral   Morbid obesity (HCC)    Nonsustained ventricular tachycardia (HCC)    Pneumonia    PONV (postoperative nausea and vomiting)    Premature ventricular contractions (PVCs)  (VPCs)    PVC (premature ventricular contraction)    Seasonal allergies    Sjogren's disease (HCC)    Thyroid disease    Wears hearing aid in both ears     Past Surgical History:  Procedure Laterality Date   BIOPSY  08/15/2020   Procedure: BIOPSY;  Surgeon: Charlott Rakes, MD;  Location: [facility];  Service: Endoscopy;;   bladder stretching     COLONOSCOPY WITH PROPOFOL N/A 08/15/2020   Procedure: COLONOSCOPY WITH PROPOFOL;  Surgeon: Charlott Rakes, MD;  Location: [facility];  Service: Endoscopy;  Laterality: N/A;   CYSTOSCOPY     DIAGNOSTIC LAPAROSCOPY     x4   DILATION AND CURETTAGE OF UTERUS     ENDOMETRIAL BIOPSY     EXCISION OF TONGUE LESION Left 10/02/2018   Procedure: EXCISION OF TONGUE LESION;  Surgeon: Osborn Coho, MD;  Location: [facility] OR;  Service: ENT;  Laterality: Left;   EYE SURGERY     HAMMER TOE SURGERY Right 2023   MYELOGRAM  1985   NASAL SEPTUM SURGERY     papiloma  2014   under tongue   STAPEDECTOMY     TONSILLECTOMY AND ADENOIDECTOMY     TUBAL LIGATION     WISDOM TOOTH EXTRACTION      MEDICATIONS:  albuterol (PROVENTIL HFA;VENTOLIN HFA) 108 (90 BASE) MCG/ACT inhaler   albuterol (PROVENTIL) (2.5 MG/3ML) 0.083% nebulizer solution   allopurinol (ZYLOPRIM) 300 MG tablet   amitriptyline (ELAVIL) 100 MG tablet   budesonide-formoterol (SYMBICORT) 80-4.5 MCG/ACT inhaler   carboxymethylcellulose 1 % ophthalmic solution   carvedilol (COREG) 12.5 MG tablet   cetirizine (ZYRTEC) 10 MG tablet   Cholecalciferol (VITAMIN D) 2000 units tablet   ciclopirox (LOPROX) 0.77 % SUSP   clobetasol (TEMOVATE) 0.05 % external solution   cromolyn (NASALCROM) 5.2 MG/ACT nasal spray   desvenlafaxine (PRISTIQ) 100 MG 24 hr tablet   docusate sodium (COLACE) 100 MG capsule   EPINEPHrine 0.3 mg/0.3 mL IJ SOAJ injection   Eyelid Cleansers (AVENOVA) 0.01 % SOLN   fluticasone (FLONASE) 50 MCG/ACT nasal spray   irbesartan (AVAPRO) 75 MG tablet   levothyroxine  (SYNTHROID) 175 MCG tablet   mexiletine (MEXITIL) 150 MG capsule   Multiple Vitamin (MULTIVITAMIN) capsule   NASAL SALINE NA   nystatin-triamcinolone ointment (MYCOLOG)   Pentosan Polysulfate Sodium 200 MG CPDR   rosuvastatin (CRESTOR) 5 MG tablet   Semaglutide, 1 MG/DOSE, 2 MG/1.5ML SOPN   Simethicone (GAS-X ULTRA STRENGTH PO)   triamterene-hydrochlorothiazide (MAXZIDE-25) 37.5-25 MG tablet   No current facility-administered medications for this encounter.   Marcille Blanco [facility] Short Stay/Anesthesiology [facility] Phone [phone] 11/14/2023 10:01 AM

## 2023-11-17 ENCOUNTER — Ambulatory Visit: Payer: Managed Care, Other (non HMO) | Admitting: Nurse Practitioner

## 2023-11-17 ENCOUNTER — Ambulatory Visit: Payer: No Typology Code available for payment source | Admitting: Internal Medicine

## 2023-11-17 ENCOUNTER — Ambulatory Visit: Payer: No Typology Code available for payment source

## 2023-11-17 ENCOUNTER — Encounter (HOSPITAL_COMMUNITY): Payer: Self-pay | Admitting: Urology

## 2023-11-17 ENCOUNTER — Ambulatory Visit: Payer: Managed Care, Other (non HMO) | Admitting: Internal Medicine

## 2023-11-17 ENCOUNTER — Encounter: Payer: Self-pay | Admitting: Internal Medicine

## 2023-11-17 VITALS — BP 130/84 | HR 89 | Temp 98.1°F | Ht 67.0 in | Wt 273.6 lb

## 2023-11-17 DIAGNOSIS — J45991 Cough variant asthma: Secondary | ICD-10-CM

## 2023-11-17 MED ORDER — BUDESONIDE-FORMOTEROL FUMARATE 80-4.5 MCG/ACT IN AERO: INHALATION_SPRAY | RESPIRATORY_TRACT | 12 refills | Status: DC

## 2023-11-17 NOTE — Patient Instructions (Signed)
No change in medications needed   Early mobilization, use of Incentive spirometry, minimize pain medications   Please remember to go to the  x-ray department  for your tests - we will call you with the results when they are available    Please schedule a follow up visit in 12  months but call sooner if needed

## 2023-11-17 NOTE — Assessment & Plan Note (Addendum)
Body mass index is 42.85 kg/m.  -  trending down   No results found for: "TSH"    Contributing to doe and risk of GERD/dvt/ PE and post op atx/ resp failure  >>>   reviewed the need and the process to achieve and maintain neg calorie balance > defer f/u primary care including intermittently monitoring thyroid status     >>> critical she opitimize mobility / use of IS and min narcs post op / dvt prophylaxis when hemostasis established    Discussed in detail all the  indications, usual  risks and alternatives  relative to the benefits with patient who agrees to proceed with surgery as  planned.   >>> cleared for surgery.         Each maintenance medication was reviewed in detail including emphasizing most importantly the difference between maintenance and prns and under what circumstances the prns are to be triggered using an action plan format where appropriate.  Total time for H and P, chart review, counseling, reviewing hfa  device(s) and generating customized AVS unique to this office visit / same day charting = 32 min preop clearance

## 2023-11-17 NOTE — Assessment & Plan Note (Signed)
Onset around age 62 with background of recurrent sinus infections FENO 04/02/2018  =   11 on advair 250 one bid - Spirometry 04/02/2018  FEV1 2.39 (84%)  Ratio 87 with truncation of peak flow p advair prior  - 04/02/2018   try symbicort 80  1-2 bid   - PFT's  05/19/2018  FEV1 2.60 (90 % ) ratio 91  p 17 % improvement from saba p nothing prior to study(since symb 80 x 2 x  12h prior ) with DLCO  81 % corrects to 122  % for alv volume   - 05/19/2018  After extensive coaching inhaler device  effectiveness =    75% from a baseline of 50%  - Allergy profile 05/19/2018 >  Eos 0.0 /  IgE  < 2 RAST neg  - 08/26/2018  After extensive coaching inhaler device,  effectiveness =  75% (short ti)    - FENO 08/26/2018  =   13 p symb 160 > changed to 80 2bid due to hoarseness/throat clearing  - 01/16/2023  After extensive coaching inhaler device,  effectiveness =    75% (short Ti) > continue symbicort 80 as most of her problem appears to more upper airway in nature (see chronic sinusitis)  - 11/17/2023  After extensive coaching inhaler device,  effectiveness = 75%( short ti, short breathold) > continue symbicort 80 1-2 bid     All goals of chronic asthma control met including optimal function and elimination of symptoms with minimal need for rescue therapy.  Contingencies discussed in full including contacting this office immediately if not controlling the symptoms using the rule of two's.     Comment: Based on two studies from NEJM  378; 20 p 1865 (2018) and 380 : p2020-30 (2019) in pts with mild asthma it is reasonable to use low dose symbicort eg 80 2bid "prn" flare in this setting but I emphasized this was only shown with symbicort and takes advantage of the rapid onset of action but is not the same as "rescue therapy" but can be stopped once the acute symptoms have resolved and the need for rescue has been minimized (< 2 x weekly)    She is cleared for adrenal surgery

## 2023-11-17 NOTE — Progress Notes (Addendum)
Patient ID: Kelly Thornton, female   DOB: [birth year],     MRN: [id number]    Brief patient profile:  62  yowf  never smoker works as Probation officer  for [employer] with sinus problems at age 62 = unable to breath thru nose but subsequently pattern of freq sinusitis developed req for abx  rx up to 4 x per year then around age 16 developed more of a chronic asthma pattern on advair since around 2001 and on it ever since and starting in Sept 2018 more doe so referred to pulmonary clinic 04/02/2018 by Dr  Docia Chuck   Dr Nedra Hai Sinus surgery 1984 [facility] eval dust, ragweed  Gertha Calkin for Sjogren's    History of Present Illness  04/02/2018 1st [facility] office visit/ Trevaris Pennella   Chief Complaint  Patient presents with   Pulmonary Consult    Referred by Dr. Docia Chuck. Pt states she was dxed with Asthma in 1990. She has had increased SOB and wheezing x 8 months.   new onset sob assoc with throat clearing  X sept 2018 / ok sleeping on side  Also chronic dry mouth secondary to Sjogren's Using rescue inhaler once a month now avg  twice daily / worse with lysol exp  Doe = MMRC3 = can't walk 100 yards even at a slow pace at a flat grade s stopping due to sob rec Stop advair  Start Symbicort 80 Take 1  puffs first thing in am and then another 1 puffs about 12 hours later Work on inhaler technique:  Only use your albuterol as a rescue medication GERD diet     11/06/2021  Re-establish ov/Corayma Cashatt re: asthma/rhinitis well controlled on prn symbicort one bid then ran out Oct 1st 2022 first day of covid symptom Oct 19 2021   rx neb prn s specific rx while still on coreg  12.5 mg bid  Chief Complaint  Patient presents with   Acute Visit    Day 18 since tested positive for covid.  Chest congestion with green mucous.  Out of Symbicort x 3 weeks.   Dyspnea:  MMRC1 = can walk nl pace, flat grade, can't hurry or go uphills or steps s sob   Cough: congested cough / nasal congestion on saline Aleda Grana /  slt green  tinged mucus assoc with nasal congestion Sleeping: L side down one pillow flat bed  SABA use: twice daily  02: none Covid status:   vax x 3   Rec For cough > mucinex dm 1200 mg every 12hours and if still can't stop coughing > Try prilosec otc 20mg   Take 30-60 min before first meal of the day and Pepcid ac (famotidine) 20 mg one @  bedtime until cough is completely gone for at least a week without the need for cough suppression Zpak  Prednisone 10 mg take  4 each am x 2 days,   2 each am x 2 days,  1 each am x 2 days and stop  Resume symbicort 80 Take 2 puffs first thing in am and then another 2 puffs about 12 hours later and wean after a week     01/16/2023  f/u ov/Nakeia Calvi re: rhinitis/ sinusitis/ asthma    maint on symbicort 80 2bid  s/p RSV/rx 3 courses of pred/ 2 of levaquin  last dose one week Chief Complaint  Patient presents with   Follow-up    Lungs doing well.  C/o nasal and ear congestion persistent.  Completed abt  Dyspnea:  cane walk/ knees limiting more than breathing Cough: improved since RSV/ still nasal drainage still green but reported pan allergic to abx x for levaquin which didn't work  Sleeping: flat position/ cough worse on back  SABA use: flat bed one pillow 02: none  Rec My office will be contacting you by phone for referral to ENT and sinus ct  - CT sinus 02/03/23 > nl - referred to ENT 01/16/2023 >>> neg [address]  eval > allergy testing > HP neg        11/17/2023  surgical clearance  ov/Selim Durden re: asthma/ rhinitis   maint on symbicort 80 one bid  no flares in months  Chief Complaint  Patient presents with   Follow-up    Breathing doing well.  Surgery tomorrow - removal of L adrenal gland.  New dx of Cushing's Syndrome and Ischemic Colitis.   Dyspnea:  limited by knees/ rollator grocery =[employer] no  limiting doe  Cough: none  Sleeping: bed is flat, on side/ one pillow s  resp cc  SABA use: none in months  02: none     No obvious day to day or daytime  variability or assoc excess/ purulent sputum or mucus plugs or hemoptysis or cp or chest tightness, subjective wheeze or overt sinus or hb symptoms.   Also denies any obvious fluctuation of symptoms with weather or environmental changes or other aggravating or alleviating factors except as outlined above   No unusual exposure hx or h/o childhood pna/ asthma or knowledge of premature birth.  Current Allergies, Complete Past Medical History, Past Surgical History, Family History, and Social History were reviewed in [employer] record.  ROS  The following are not active complaints unless bolded Hoarseness, sore throat, dysphagia, dental problems, itching, sneezing,  nasal congestion or discharge of excess mucus or purulent secretions, ear ache,   fever, chills, sweats, unintended wt loss or wt gain, classically pleuritic or exertional cp,  orthopnea pnd or arm/hand swelling  or leg swelling, presyncope, palpitations, abdominal pain, anorexia, nausea, vomiting, diarrhea  or change in bowel habits or change in bladder habits, change in stools or change in urine, dysuria, hematuria,  rash, arthralgias, visual complaints, headache, numbness, weakness or ataxia or problems with walking or coordination,  change in mood or  memory.        Current Meds  Medication Sig   albuterol (PROVENTIL HFA;VENTOLIN HFA) 108 (90 BASE) MCG/ACT inhaler Inhale 2 puffs into the lungs every 6 (six) hours as needed for wheezing or shortness of breath.   albuterol (PROVENTIL) (2.5 MG/3ML) 0.083% nebulizer solution Take 2.5 mg by nebulization every 6 (six) hours as needed for wheezing or shortness of breath.   allopurinol (ZYLOPRIM) 300 MG tablet Take 300 mg by mouth daily.   amitriptyline (ELAVIL) 100 MG tablet Take 100 mg by mouth at bedtime.   budesonide-formoterol (SYMBICORT) 80-4.5 MCG/ACT inhaler Take 2 puffs first thing in am and then another 2 puffs about 12 hours later. (Patient taking differently:  Inhale 1 puff into the lungs in the morning and at bedtime.)   carboxymethylcellulose 1 % ophthalmic solution Place 1 drop into both eyes 2 (two) times daily.   carvedilol (COREG) 12.5 MG tablet TAKE 1 TABLET BY MOUTH TWICE  DAILY   cetirizine (ZYRTEC) 10 MG tablet Take 10 mg by mouth 2 (two) times daily.   Cholecalciferol (VITAMIN D) 2000 units tablet Take 2,000 Units by mouth daily.    ciclopirox (LOPROX) 0.77 %  SUSP Apply 1 application. topically 2 (two) times daily. Q am (Patient taking differently: Apply 1 application  topically 2 (two) times daily as needed (irritation). Q am)   clobetasol (TEMOVATE) 0.05 % external solution Apply 1 application. topically 2 (two) times daily. For scalp at night- not for face. (Patient taking differently: Apply 1 application  topically 2 (two) times daily as needed (irritation). For scalp at night- not for face.)   cromolyn (NASALCROM) 5.2 MG/ACT nasal spray Place 1 spray into both nostrils in the morning and at bedtime.   desvenlafaxine (PRISTIQ) 100 MG 24 hr tablet Take 100 mg by mouth daily.   docusate sodium (COLACE) 100 MG capsule Take 100 mg by mouth 2 (two) times daily.   EPINEPHrine 0.3 mg/0.3 mL IJ SOAJ injection Inject 0.3 mg into the muscle as needed for anaphylaxis ("from seafood").    Eyelid Cleansers (AVENOVA) 0.01 % SOLN Place 1 application into both eyes 2 (two) times daily.   fluticasone (FLONASE) 50 MCG/ACT nasal spray Place 2 sprays into both nostrils at bedtime.   irbesartan (AVAPRO) 75 MG tablet Take 75 mg by mouth daily.   levothyroxine (SYNTHROID) 175 MCG tablet Take 175 mcg by mouth daily before breakfast.   mexiletine (MEXITIL) 150 MG capsule TAKE 1 CAPSULE BY MOUTH TWICE  DAILY   Multiple Vitamin (MULTIVITAMIN) capsule Take 1 capsule by mouth daily.   NASAL SALINE NA Place 2 sprays into both nostrils daily as needed (for dryness or congestion).    nystatin-triamcinolone ointment (MYCOLOG) Apply 1 Application topically 2 (two) times  daily. (Patient taking differently: Apply 1 Application topically 2 (two) times daily as needed (irritation).)   Pentosan Polysulfate Sodium 200 MG CPDR Take 200 mg by mouth 2 (two) times daily.   rosuvastatin (CRESTOR) 5 MG tablet Take 5 mg by mouth at bedtime.    Semaglutide, 1 MG/DOSE, 2 MG/1.5ML SOPN Inject 2 mg into the skin once a week.   Simethicone (GAS-X ULTRA STRENGTH PO) Take 250 mg by mouth in the morning and at bedtime.   triamterene-hydrochlorothiazide (MAXZIDE-25) 37.5-25 MG tablet Take 1 tablet by mouth daily.               Objective:   Physical Exam    Wts  11/17/2023        273  01/16/2023         284  11/18/2022       298  11/06/2021       338  12/04/2018      367  08/26/2018          376  05/19/2018         376   04/02/18 (!) 381 lb (172.8 kg)  06/14/14 (!) 353 lb 14.4 oz (160.5 kg)     Vital signs reviewed  11/17/2023  - Note at rest 02 sats  99% on RA   General appearance:    amb MO (by BMI ) pleasant wf nad    HEENT : Oropharynx  clear  M2 airway      Nasal turbinates nl    NECK :  without  apparent JVD/ palpable Nodes/TM    LUNGS: no acc muscle use,  Nl contour chest which is clear to A and P bilaterally without cough on insp or exp maneuvers   CV:  RRR  no s3 or murmur or increase in P2, and no edema   ABD: obese  soft and nontender with limited insp excursion   MS:  slow  gait with 4 pronged cane/ ext warm without deformities Or obvious joint restrictions  calf tenderness, cyanosis or clubbing    SKIN: warm and dry without lesions    NEURO:  alert, approp, nl sensorium with  no motor or cerebellar deficits apparent.    CXR PA and Lateral:   11/17/2023 :    I personally reviewed images and impression is as follows:     Improved CM/ no acute findings vs baseline  04/03/18         Assessment:

## 2023-11-17 NOTE — H&P (Signed)
Office Visit Report     11/14/2023   --------------------------------------------------------------------------------   Kelly Thornton  MRN: [id number]  DOB: [birth year], 62 year old Female  SSN: **[id number]   PRIMARY CARE:     REFERRING:  Tonita Cong, MD  PROVIDER:  Rhoderick Moody, M.D.  TREATING:  Ulyses Amor, [place]  LOCATION:  [facility] [phone]     --------------------------------------------------------------------------------   CC/HPI: Pt presents today for pre-operative history and physical exam in anticipation of left robotic assisted lap adrenalectomy by Dr. Liliane Shi on 11/18/23. She is doing well and is without complaint.   Pt denies F/C, HA, CP, SOB, N/V, diarrhea/constipation, back pain, flank pain, hematuria, and dysuria.     HX:   High Cortisol   Ms. Kelly Thornton is a 62 year old female referred by Dr. Sharl Ma to discuss her abnormal cortisol levels.   CT from [facility] on 04/2023 showed a 3 cm left adrenal adenoma. Her dexamethasone suppression test and midnight salivary cortisol level came back above the normal range. Plasma free metanephrines from 06/30/2023 showed a metanephrine level of less than 25.0 and normetanephrine level of 129.5 (both are WNL). Lost 140 lbs over the past 5 years. Hx of IC--followed by Dr. Logan Bores. Hx of lap endometriosis ablation x3 in her 20-30s. Hx of left lower quadrant pain--recently diagnosed with ischemic coliits.     ALLERGIES: None    Notes: Seafood anaphylaxis  Avalox gastritis  Betadine rash  Bextra rash  Biaxin rash  Ceclor rash  Codeine GI upset  Doxycycline rash  Davocet Gi upset  Erythromycin rash  Hibiclens rash  Keflex rash  Lisinopril mouth and tongue swelling  Novocain rash  PCN rash  Pneumo vax rash  Simvastatin leg cramps  Tetracycline rash  Thimerosal rash  Vicodin nausea and constipation  Adhesives burns skin  Silicone burns skin  MSG GI upset  Nickel rash  Rubbing alcoihol Burns  skin  Milk lactose intolerance  CHocolate sneezing     MEDICATIONS: Elmiron  Levothyroxine Sodium  Albuterol Sulfate  Amitriptyline Hcl 100 mg tablet  Avenova 0.01 % spray, non-aerosol  Benadryl  Betamethasone Dipropionate 0.05 % cream  Carvedilol 12.5 mg tablet  Cetirizine Hcl  Desvenlafaxine Er 100 mg tablet, extended release 24 hr  Epi Pen  Flonase Allergy Relief  Gas X  Irbesartan 75 mg tablet  Mexiletine Hcl 150 mg capsule  Multivitamin  Ozempic  Peptiva, Probiotic And Sleep  Prednisone  Rosuvastatin Calcium  Stool Softener  Symbicort 80 mcg-4.5 mcg/actuation hfa aerosol with adapter  Tobramycin 0.3 % drops  Triamterene-Hydrochlorothiazid 37.5 mg-25 mg capsule     GU PSH: None     PSH Notes: Hammer toe surgery  Lap endometriosis ablation  stye removal  "stretching of bladder"  Tonsils and adneoids  wisdom teeth  deviated septum  ruptured disc myelogram  stapedotomy  d and c  removal of benign papilloma under tongue     NON-GU PSH: None   GU PMH: Adrenal mass Unspec - 11/12/2023, - 10/20/2023      PMH Notes: Cushing's syndrome- dx 06/2023   Ischemic colitis   Sjogren's   IBS   IC   Meniere's disease       NON-GU PMH: Anxiety Arthritis Asthma Depression Diabetes Type 2 GERD Hypercholesterolemia Hypertension Hypothyroidism    FAMILY HISTORY: Deceased - Mother, Father Kidney Failure - Grandfather   SOCIAL HISTORY: Marital Status: Married Preferred Language: English; Ethnicity: Not Hispanic Or Latino; Race: White Current Smoking Status: Patient has never smoked.  Tobacco Use Assessment Completed: Used Tobacco in last 30 days? Has never drank.  Does not use drugs. Drinks 3 caffeinated drinks per day. Has not had a blood transfusion. Patient's occupation Printmaker.    REVIEW OF SYSTEMS:    GU Review Female:   Patient denies stream starts and stops, burning /pain with urination, have to strain to urinate, frequent  urination, get up at night to urinate, hard to postpone urination, being pregnant, trouble starting your stream, and leakage of urine.  Gastrointestinal (Upper):   Patient denies nausea, vomiting, and indigestion/ heartburn.  Gastrointestinal (Lower):   Patient denies diarrhea and constipation.  Constitutional:   Patient denies fever, night sweats, weight loss, and fatigue.  Skin:   Patient denies skin rash/ lesion and itching.  Eyes:   Patient denies blurred vision and double vision.  Ears/ Nose/ Throat:   Patient denies sore throat and sinus problems.  Hematologic/Lymphatic:   Patient denies swollen glands and easy bruising.  Cardiovascular:   Patient denies leg swelling and chest pains.  Respiratory:   Patient denies cough and shortness of breath.  Endocrine:   Patient denies excessive thirst.  Musculoskeletal:   Patient denies back pain and joint pain.  Neurological:   Patient denies headaches and dizziness.  Psychologic:   Patient denies depression and anxiety.   VITAL SIGNS:      11/14/2023 01:34 PM  Weight 272 lb / 123.38 kg  Height 67 in / 170.18 cm  BP 138/84 mmHg  Heart Rate 98 /min  Temperature 97.1 F / 36.1 C  BMI 42.6 kg/m   MULTI-SYSTEM PHYSICAL EXAMINATION:    Constitutional: Well-nourished. No physical deformities. Normally developed. Good grooming. Uses cane  Neck: Neck symmetrical, not swollen. Normal tracheal position.  Respiratory: Normal breath sounds. No labored breathing, no use of accessory muscles.   Cardiovascular: Regular rate and rhythm. No murmur, no gallop. PVCs   Lymphatic: No enlargement of neck, axillae, groin.  Skin: No paleness, no jaundice, no cyanosis. No lesion, no ulcer, no rash.  Neurologic / Psychiatric: Oriented to time, oriented to place, oriented to person. No depression, no anxiety, no agitation.  Gastrointestinal: No mass, no tenderness, no rigidity, obese abdomen.   Eyes: Normal conjunctivae. Normal eyelids.  Ears, Nose, Mouth, and  Throat: Left ear no scars, no lesions, no masses. Right ear no scars, no lesions, no masses. Nose no scars, no lesions, no masses. Normal hearing. Normal lips.  Musculoskeletal: Normal gait and station of head and neck.     Complexity of Data: CLINICAL DATA: Adrenal mass   EXAM:  CT ABDOMEN WITHOUT AND WITH CONTRAST   TECHNIQUE:  Multidetector CT imaging of the abdomen was performed following the  standard protocol before and following the bolus administration of  intravenous contrast.   RADIATION DOSE REDUCTION: This exam was performed according to the  departmental dose-optimization program which includes automated  exposure control, adjustment of the mA and/or kV according to  patient size and/or use of iterative reconstruction technique.   CONTRAST: 100 mL Omnipaque 300 iodinated contrast IV   COMPARISON: None Available.   FINDINGS:  Lower chest: No acute abnormality.   Hepatobiliary: No solid liver abnormality is seen. Contracted  gallbladder. No gallstones, gallbladder wall thickening, or biliary  dilatation.   Pancreas: Unremarkable. No pancreatic ductal dilatation or  surrounding inflammatory changes.   Spleen: Normal in size without significant abnormality.   Adrenals/Urinary Tract: Definitively benign, macroscopic fat  containing left adrenal adenoma measuring 3.1 x 2.3  cm (series 2,  image 41). Normal right adrenal gland.. Kidneys are normal, without  renal calculi, solid lesion, or hydronephrosis.   Stomach/Bowel: Stomach is within normal limits. No evidence of bowel  wall thickening, distention, or inflammatory changes.   Vascular/Lymphatic: Scattered aortic atherosclerosis. No enlarged  abdominal lymph nodes.   Other: No abdominal wall hernia or abnormality. No ascites.   Musculoskeletal: No acute or significant osseous findings.   IMPRESSION:  1. Definitively benign, macroscopic fat containing left adrenal  adenoma measuring 3.1 x 2.3 cm. No further  follow-up or  characterization is required for this benign finding.  2. No acute findings in the abdomen.   Aortic Atherosclerosis (ICD10-I70.0).    Electronically Signed  By: Jearld Lesch M.D.  On: 11/12/2023 16:54   Records Review:   Previous Patient Records  Urine Test Review:   Urinalysis   11/14/23  Urinalysis  Urine Appearance Clear   Urine Color Yellow   Urine Glucose Neg mg/dL  Urine Bilirubin Neg mg/dL  Urine Ketones Neg mg/dL  Urine Specific Gravity 1.015   Urine Blood Neg ery/uL  Urine pH 6.5   Urine Protein Trace mg/dL  Urine Urobilinogen 0.2 mg/dL  Urine Nitrites Neg   Urine Leukocyte Esterase Neg leu/uL   PROCEDURES:          Urinalysis - 81003 Dipstick Dipstick Cont'd  Color: Yellow Bilirubin: Neg mg/dL  Appearance: Clear Ketones: Neg mg/dL  Specific Gravity: 1.610 Blood: Neg ery/uL  pH: 6.5 Protein: Trace mg/dL  Glucose: Neg mg/dL Urobilinogen: 0.2 mg/dL    Nitrites: Neg    Leukocyte Esterase: Neg leu/uL    ASSESSMENT:      ICD-10 Details  1 GU:   Adrenal mass Unspec - D35.00    PLAN:           Schedule Return Visit/Planned Activity: Keep Scheduled Appointment - Schedule Surgery          Document Letter(s):  Created for Patient: Clinical Summary         Notes:   There are no changes in the patients history or physical exam since last evaluation by Dr. Liliane Shi. Pt is scheduled to undergo left RAL adrenalectomy on 11/18/23.   All pt's questions were answered to the best of my ability.    -The risk, benefits and alternatives of robot-assisted laparoscopic left adrenalectomy was discussed at length. Risk include, but are not limited to, bleeding complications, infection, adjacent organ injury, hernia formation, persistently elevated cortisol levels, MI, PE, DVT and the inherent risk of general anesthesia. She voices understanding and wishes to proceed.

## 2023-11-18 ENCOUNTER — Inpatient Hospital Stay (HOSPITAL_COMMUNITY)
Admission: RE | Admit: 2023-11-18 | Discharge: 2023-11-19 | DRG: 614 | Disposition: A | Discharge status: Home / Self Care | Payer: No Typology Code available for payment source | Attending: Urology | Admitting: Urology

## 2023-11-18 ENCOUNTER — Inpatient Hospital Stay (HOSPITAL_COMMUNITY): Payer: No Typology Code available for payment source | Admitting: Medical

## 2023-11-18 ENCOUNTER — Encounter (HOSPITAL_COMMUNITY)
Admission: RE | Disposition: A | Discharge status: Home / Self Care | Payer: Self-pay | Source: Home / Self Care | Attending: Urology

## 2023-11-18 ENCOUNTER — Inpatient Hospital Stay (HOSPITAL_COMMUNITY): Payer: No Typology Code available for payment source | Admitting: Certified Registered Nurse Anesthetist

## 2023-11-18 ENCOUNTER — Encounter (HOSPITAL_COMMUNITY): Payer: Self-pay | Admitting: Urology

## 2023-11-18 ENCOUNTER — Other Ambulatory Visit: Payer: Self-pay

## 2023-11-18 DIAGNOSIS — H9190 Unspecified hearing loss, unspecified ear: Secondary | ICD-10-CM | POA: Diagnosis present

## 2023-11-18 DIAGNOSIS — H8109 Meniere's disease, unspecified ear: Secondary | ICD-10-CM | POA: Diagnosis present

## 2023-11-18 DIAGNOSIS — E7800 Pure hypercholesterolemia, unspecified: Secondary | ICD-10-CM | POA: Diagnosis present

## 2023-11-18 DIAGNOSIS — K219 Gastro-esophageal reflux disease without esophagitis: Secondary | ICD-10-CM | POA: Diagnosis present

## 2023-11-18 DIAGNOSIS — E249 Cushing's syndrome, unspecified: Secondary | ICD-10-CM | POA: Diagnosis present

## 2023-11-18 DIAGNOSIS — Z887 Allergy status to serum and vaccine status: Secondary | ICD-10-CM

## 2023-11-18 DIAGNOSIS — Z883 Allergy status to other anti-infective agents status: Secondary | ICD-10-CM | POA: Diagnosis not present

## 2023-11-18 DIAGNOSIS — E119 Type 2 diabetes mellitus without complications: Secondary | ICD-10-CM | POA: Diagnosis present

## 2023-11-18 DIAGNOSIS — M3500 Sicca syndrome, unspecified: Secondary | ICD-10-CM | POA: Diagnosis present

## 2023-11-18 DIAGNOSIS — Z6841 Body Mass Index (BMI) 40.0 and over, adult: Secondary | ICD-10-CM

## 2023-11-18 DIAGNOSIS — N3010 Interstitial cystitis (chronic) without hematuria: Secondary | ICD-10-CM | POA: Diagnosis present

## 2023-11-18 DIAGNOSIS — D3502 Benign neoplasm of left adrenal gland: Secondary | ICD-10-CM

## 2023-11-18 DIAGNOSIS — Z91018 Allergy to other foods: Secondary | ICD-10-CM

## 2023-11-18 DIAGNOSIS — E039 Hypothyroidism, unspecified: Secondary | ICD-10-CM | POA: Diagnosis present

## 2023-11-18 DIAGNOSIS — Z881 Allergy status to other antibiotic agents status: Secondary | ICD-10-CM | POA: Diagnosis not present

## 2023-11-18 DIAGNOSIS — Z79899 Other long term (current) drug therapy: Secondary | ICD-10-CM | POA: Diagnosis not present

## 2023-11-18 DIAGNOSIS — Z9089 Acquired absence of other organs: Secondary | ICD-10-CM

## 2023-11-18 DIAGNOSIS — Z79890 Hormone replacement therapy (postmenopausal): Secondary | ICD-10-CM

## 2023-11-18 DIAGNOSIS — I10 Essential (primary) hypertension: Secondary | ICD-10-CM | POA: Diagnosis present

## 2023-11-18 DIAGNOSIS — J45991 Cough variant asthma: Secondary | ICD-10-CM | POA: Diagnosis present

## 2023-11-18 DIAGNOSIS — Z91013 Allergy to seafood: Secondary | ICD-10-CM

## 2023-11-18 DIAGNOSIS — K559 Vascular disorder of intestine, unspecified: Secondary | ICD-10-CM | POA: Diagnosis present

## 2023-11-18 DIAGNOSIS — Z91048 Other nonmedicinal substance allergy status: Secondary | ICD-10-CM

## 2023-11-18 DIAGNOSIS — K589 Irritable bowel syndrome without diarrhea: Secondary | ICD-10-CM | POA: Diagnosis present

## 2023-11-18 DIAGNOSIS — Z8701 Personal history of pneumonia (recurrent): Secondary | ICD-10-CM

## 2023-11-18 DIAGNOSIS — E278 Other specified disorders of adrenal gland: Principal | ICD-10-CM | POA: Diagnosis present

## 2023-11-18 DIAGNOSIS — Z91011 Allergy to milk products: Secondary | ICD-10-CM

## 2023-11-18 DIAGNOSIS — Z9889 Other specified postprocedural states: Secondary | ICD-10-CM

## 2023-11-18 DIAGNOSIS — Z880 Allergy status to penicillin: Secondary | ICD-10-CM

## 2023-11-18 DIAGNOSIS — Z7985 Long-term (current) use of injectable non-insulin antidiabetic drugs: Secondary | ICD-10-CM

## 2023-11-18 DIAGNOSIS — M797 Fibromyalgia: Secondary | ICD-10-CM | POA: Diagnosis present

## 2023-11-18 DIAGNOSIS — Z884 Allergy status to anesthetic agent status: Secondary | ICD-10-CM

## 2023-11-18 DIAGNOSIS — Z87892 Personal history of anaphylaxis: Secondary | ICD-10-CM | POA: Diagnosis not present

## 2023-11-18 DIAGNOSIS — Z974 Presence of external hearing-aid: Secondary | ICD-10-CM

## 2023-11-18 DIAGNOSIS — Z888 Allergy status to other drugs, medicaments and biological substances status: Secondary | ICD-10-CM

## 2023-11-18 DIAGNOSIS — Z841 Family history of disorders of kidney and ureter: Secondary | ICD-10-CM

## 2023-11-18 DIAGNOSIS — Z885 Allergy status to narcotic agent status: Secondary | ICD-10-CM

## 2023-11-18 HISTORY — PX: ROBOTIC ADRENALECTOMY: SHX6407

## 2023-11-18 LAB — GLUCOSE, CAPILLARY
Glucose-Capillary: 83 mg/dL (ref 70–99)
Glucose-Capillary: 114 mg/dL — ABNORMAL HIGH (ref 70–99)

## 2023-11-18 LAB — TYPE AND SCREEN
ABO/RH(D): A NEG
Antibody Screen: NEGATIVE

## 2023-11-18 LAB — HEMOGLOBIN AND HEMATOCRIT, BLOOD
HCT: 36.6 % (ref 36.0–46.0)
Hemoglobin: 12.5 g/dL (ref 12.0–15.0)

## 2023-11-18 LAB — ABO/RH: ABO/RH(D): A NEG

## 2023-11-18 SURGERY — ADRENALECTOMY, ROBOT-ASSISTED
Anesthesia: General | Site: Flank | Laterality: Left

## 2023-11-18 MED ORDER — DEXAMETHASONE SODIUM PHOSPHATE 10 MG/ML IJ SOLN
INTRAMUSCULAR | Status: DC | PRN
  Administered 2023-11-18: 5 mg via INTRAVENOUS

## 2023-11-18 MED ORDER — ALBUTEROL SULFATE (2.5 MG/3ML) 0.083% IN NEBU: 2.5000 mg | INHALATION_SOLUTION | Freq: Four times a day (QID) | RESPIRATORY_TRACT | Status: DC | PRN

## 2023-11-18 MED ORDER — ALBUTEROL SULFATE HFA 108 (90 BASE) MCG/ACT IN AERS: 2.0000 | INHALATION_SPRAY | Freq: Four times a day (QID) | RESPIRATORY_TRACT | Status: DC | PRN

## 2023-11-18 MED ORDER — FENTANYL CITRATE (PF) 250 MCG/5ML IJ SOLN
INTRAMUSCULAR | Status: DC | PRN
  Administered 2023-11-18: 100 ug via INTRAVENOUS
  Administered 2023-11-18 (×2): 50 ug via INTRAVENOUS

## 2023-11-18 MED ORDER — AMISULPRIDE (ANTIEMETIC) 5 MG/2ML IV SOLN: 10.0000 mg | Freq: Once | INTRAVENOUS | Status: DC | PRN

## 2023-11-18 MED ORDER — ONDANSETRON HCL 4 MG/2ML IJ SOLN: 4.0000 mg | Freq: Once | INTRAMUSCULAR | Status: DC | PRN

## 2023-11-18 MED ORDER — LIDOCAINE HCL (PF) 2 % IJ SOLN
INTRAMUSCULAR | Status: AC
  Filled 2023-11-18: qty 5

## 2023-11-18 MED ORDER — MIDAZOLAM HCL 2 MG/2ML IJ SOLN
INTRAMUSCULAR | Status: DC | PRN
  Administered 2023-11-18: 2 mg via INTRAVENOUS

## 2023-11-18 MED ORDER — ORAL CARE MOUTH RINSE: 15.0000 mL | Freq: Once | OROMUCOSAL | Status: AC

## 2023-11-18 MED ORDER — HYOSCYAMINE SULFATE 0.125 MG SL SUBL: 0.1250 mg | SUBLINGUAL_TABLET | SUBLINGUAL | Status: DC | PRN

## 2023-11-18 MED ORDER — BUPIVACAINE LIPOSOME 1.3 % IJ SUSP
INTRAMUSCULAR | Status: DC | PRN
  Administered 2023-11-18: 20 mL

## 2023-11-18 MED ORDER — ONDANSETRON HCL 4 MG/2ML IJ SOLN: 4.0000 mg | INTRAMUSCULAR | Status: DC | PRN

## 2023-11-18 MED ORDER — LIDOCAINE 2% (20 MG/ML) 5 ML SYRINGE
INTRAMUSCULAR | Status: DC | PRN
  Administered 2023-11-18: 80 mg via INTRAVENOUS

## 2023-11-18 MED ORDER — LIDOCAINE HCL 2 % IJ SOLN
INTRAMUSCULAR | Status: AC
  Filled 2023-11-18: qty 20

## 2023-11-18 MED ORDER — ROSUVASTATIN CALCIUM 5 MG PO TABS
5.0000 mg | ORAL_TABLET | Freq: Every day | ORAL | Status: DC
  Administered 2023-11-18: 5 mg via ORAL
  Filled 2023-11-18: qty 1

## 2023-11-18 MED ORDER — LIDOCAINE 20MG/ML (2%) 15 ML SYRINGE OPTIME
INTRAMUSCULAR | Status: DC | PRN
  Administered 2023-11-18: 1.5 mg/kg/h via INTRAVENOUS

## 2023-11-18 MED ORDER — PHENYLEPHRINE HCL-NACL 20-0.9 MG/250ML-% IV SOLN
INTRAVENOUS | Status: DC | PRN
  Administered 2023-11-18: 50 ug/min via INTRAVENOUS

## 2023-11-18 MED ORDER — ONDANSETRON HCL 4 MG/2ML IJ SOLN
INTRAMUSCULAR | Status: DC | PRN
  Administered 2023-11-18: 4 mg via INTRAVENOUS

## 2023-11-18 MED ORDER — DIPHENHYDRAMINE HCL 50 MG/ML IJ SOLN
12.5000 mg | Freq: Four times a day (QID) | INTRAMUSCULAR | Status: DC | PRN
  Administered 2023-11-19: 25 mg via INTRAVENOUS
  Filled 2023-11-18: qty 1

## 2023-11-18 MED ORDER — TRIAMTERENE-HCTZ 37.5-25 MG PO TABS
1.0000 | ORAL_TABLET | Freq: Every day | ORAL | Status: DC
  Administered 2023-11-19: 1 via ORAL
  Filled 2023-11-18: qty 1

## 2023-11-18 MED ORDER — SODIUM CHLORIDE (PF) 0.9 % IJ SOLN
INTRAMUSCULAR | Status: AC
  Filled 2023-11-18: qty 20

## 2023-11-18 MED ORDER — PROPOFOL 10 MG/ML IV BOLUS
INTRAVENOUS | Status: AC
  Filled 2023-11-18: qty 20

## 2023-11-18 MED ORDER — HYDROMORPHONE HCL 1 MG/ML IJ SOLN: 0.2500 mg | INTRAMUSCULAR | Status: DC | PRN

## 2023-11-18 MED ORDER — TRAMADOL HCL 50 MG PO TABS: 50.0000 mg | ORAL_TABLET | Freq: Four times a day (QID) | ORAL | 0 refills | Status: DC | PRN

## 2023-11-18 MED ORDER — MEXILETINE HCL 150 MG PO CAPS
150.0000 mg | ORAL_CAPSULE | Freq: Two times a day (BID) | ORAL | Status: DC
  Administered 2023-11-18 – 2023-11-19 (×2): 150 mg via ORAL
  Filled 2023-11-18 (×3): qty 1

## 2023-11-18 MED ORDER — ONDANSETRON HCL 4 MG/2ML IJ SOLN
INTRAMUSCULAR | Status: AC
  Filled 2023-11-18: qty 2

## 2023-11-18 MED ORDER — LEVOTHYROXINE SODIUM 50 MCG PO TABS
175.0000 ug | ORAL_TABLET | Freq: Every day | ORAL | Status: DC
  Administered 2023-11-19: 175 ug via ORAL
  Filled 2023-11-18: qty 1

## 2023-11-18 MED ORDER — ALLOPURINOL 300 MG PO TABS
300.0000 mg | ORAL_TABLET | Freq: Every day | ORAL | Status: DC
  Administered 2023-11-19: 300 mg via ORAL
  Filled 2023-11-18: qty 1

## 2023-11-18 MED ORDER — CLINDAMYCIN PHOSPHATE 900 MG/50ML IV SOLN
900.0000 mg | Freq: Once | INTRAVENOUS | Status: AC
  Administered 2023-11-18: 900 mg via INTRAVENOUS
  Filled 2023-11-18: qty 50

## 2023-11-18 MED ORDER — DIPHENHYDRAMINE HCL 12.5 MG/5ML PO ELIX: 12.5000 mg | ORAL_SOLUTION | Freq: Four times a day (QID) | ORAL | Status: DC | PRN

## 2023-11-18 MED ORDER — FLUTICASONE FUROATE-VILANTEROL 100-25 MCG/ACT IN AEPB
1.0000 | INHALATION_SPRAY | Freq: Every day | RESPIRATORY_TRACT | Status: DC
  Administered 2023-11-19: 1 via RESPIRATORY_TRACT
  Filled 2023-11-18: qty 28

## 2023-11-18 MED ORDER — TRAMADOL HCL 50 MG PO TABS
50.0000 mg | ORAL_TABLET | Freq: Four times a day (QID) | ORAL | Status: DC | PRN
  Administered 2023-11-18 – 2023-11-19 (×2): 50 mg via ORAL
  Filled 2023-11-18 (×2): qty 1

## 2023-11-18 MED ORDER — ROCURONIUM BROMIDE 10 MG/ML (PF) SYRINGE
PREFILLED_SYRINGE | INTRAVENOUS | Status: AC
  Filled 2023-11-18: qty 10

## 2023-11-18 MED ORDER — FENTANYL CITRATE (PF) 250 MCG/5ML IJ SOLN
INTRAMUSCULAR | Status: AC
  Filled 2023-11-18: qty 5

## 2023-11-18 MED ORDER — KETAMINE HCL 10 MG/ML IJ SOLN
INTRAMUSCULAR | Status: DC | PRN
  Administered 2023-11-18: 30 mg via INTRAVENOUS

## 2023-11-18 MED ORDER — PHENYLEPHRINE HCL-NACL 20-0.9 MG/250ML-% IV SOLN
INTRAVENOUS | Status: AC
  Filled 2023-11-18: qty 250

## 2023-11-18 MED ORDER — DOCUSATE SODIUM 100 MG PO CAPS
100.0000 mg | ORAL_CAPSULE | Freq: Two times a day (BID) | ORAL | Status: DC
  Administered 2023-11-18 – 2023-11-19 (×2): 100 mg via ORAL
  Filled 2023-11-18 (×2): qty 1

## 2023-11-18 MED ORDER — PROPOFOL 10 MG/ML IV BOLUS
INTRAVENOUS | Status: DC | PRN
  Administered 2023-11-18: 150 mg via INTRAVENOUS

## 2023-11-18 MED ORDER — INSULIN ASPART 100 UNIT/ML IJ SOLN: 0.0000 [IU] | INTRAMUSCULAR | Status: DC | PRN

## 2023-11-18 MED ORDER — ACETAMINOPHEN 500 MG PO TABS
1000.0000 mg | ORAL_TABLET | Freq: Four times a day (QID) | ORAL | Status: AC
Start: 2023-11-18 — End: 2023-11-19
  Filled 2023-11-18: qty 2

## 2023-11-18 MED ORDER — LACTATED RINGERS IV SOLN: INTRAVENOUS | Status: DC

## 2023-11-18 MED ORDER — ACETAMINOPHEN 10 MG/ML IV SOLN
INTRAVENOUS | Status: DC | PRN
  Administered 2023-11-18: 1000 mg via INTRAVENOUS

## 2023-11-18 MED ORDER — AMITRIPTYLINE HCL 25 MG PO TABS
100.0000 mg | ORAL_TABLET | Freq: Every day | ORAL | Status: DC
  Administered 2023-11-18: 100 mg via ORAL
  Filled 2023-11-18: qty 4

## 2023-11-18 MED ORDER — HYDROMORPHONE HCL 1 MG/ML IJ SOLN: 0.5000 mg | INTRAMUSCULAR | Status: DC | PRN

## 2023-11-18 MED ORDER — SODIUM CHLORIDE 0.45 % IV SOLN: INTRAVENOUS | Status: DC

## 2023-11-18 MED ORDER — SUGAMMADEX SODIUM 200 MG/2ML IV SOLN
INTRAVENOUS | Status: DC | PRN
  Administered 2023-11-18: 300 mg via INTRAVENOUS

## 2023-11-18 MED ORDER — OXYCODONE HCL 5 MG/5ML PO SOLN: 5.0000 mg | Freq: Once | ORAL | Status: DC | PRN

## 2023-11-18 MED ORDER — STERILE WATER FOR IRRIGATION IR SOLN
Status: DC | PRN
  Administered 2023-11-18: 1000 mL

## 2023-11-18 MED ORDER — OXYCODONE HCL 5 MG PO TABS: 5.0000 mg | ORAL_TABLET | Freq: Once | ORAL | Status: DC | PRN

## 2023-11-18 MED ORDER — CARVEDILOL 12.5 MG PO TABS
12.5000 mg | ORAL_TABLET | Freq: Two times a day (BID) | ORAL | Status: DC
  Administered 2023-11-18 – 2023-11-19 (×2): 12.5 mg via ORAL
  Filled 2023-11-18 (×2): qty 1

## 2023-11-18 MED ORDER — DEXAMETHASONE SODIUM PHOSPHATE 10 MG/ML IJ SOLN
INTRAMUSCULAR | Status: AC
  Filled 2023-11-18: qty 1

## 2023-11-18 MED ORDER — ROCURONIUM BROMIDE 10 MG/ML (PF) SYRINGE
PREFILLED_SYRINGE | INTRAVENOUS | Status: DC | PRN
  Administered 2023-11-18: 10 mg via INTRAVENOUS
  Administered 2023-11-18: 90 mg via INTRAVENOUS

## 2023-11-18 MED ORDER — CHLORHEXIDINE GLUCONATE 0.12 % MT SOLN
15.0000 mL | Freq: Once | OROMUCOSAL | Status: AC
Start: 2023-11-18 — End: 2023-11-18
  Administered 2023-11-18: 15 mL via OROMUCOSAL

## 2023-11-18 MED ORDER — BUPIVACAINE LIPOSOME 1.3 % IJ SUSP
INTRAMUSCULAR | Status: AC
  Filled 2023-11-18: qty 20

## 2023-11-18 MED ORDER — MIDAZOLAM HCL 2 MG/2ML IJ SOLN
INTRAMUSCULAR | Status: AC
Start: 2023-11-18 — End: ?
  Filled 2023-11-18: qty 2

## 2023-11-18 MED ORDER — SODIUM CHLORIDE (PF) 0.9 % IJ SOLN
INTRAMUSCULAR | Status: DC | PRN
  Administered 2023-11-18: 20 mL

## 2023-11-18 MED ORDER — KETAMINE HCL 50 MG/5ML IJ SOSY
PREFILLED_SYRINGE | INTRAMUSCULAR | Status: AC
  Filled 2023-11-18: qty 5

## 2023-11-18 MED ORDER — ACETAMINOPHEN 10 MG/ML IV SOLN
INTRAVENOUS | Status: AC
  Filled 2023-11-18: qty 100

## 2023-11-18 MED ORDER — IRBESARTAN 150 MG PO TABS
75.0000 mg | ORAL_TABLET | Freq: Every day | ORAL | Status: DC
  Administered 2023-11-19: 75 mg via ORAL
  Filled 2023-11-18: qty 1

## 2023-11-18 MED ORDER — SCOPOLAMINE 1 MG/3DAYS TD PT72
1.0000 | MEDICATED_PATCH | TRANSDERMAL | Status: DC
  Administered 2023-11-18: 1.5 mg via TRANSDERMAL
  Filled 2023-11-18: qty 1

## 2023-11-18 MED ORDER — VENLAFAXINE HCL ER 150 MG PO CP24
150.0000 mg | ORAL_CAPSULE | Freq: Every day | ORAL | Status: DC
  Administered 2023-11-19: 150 mg via ORAL
  Filled 2023-11-18: qty 1

## 2023-11-18 SURGICAL SUPPLY — 55 items
APPLICATOR SURGIFLO ENDO (HEMOSTASIS) IMPLANT
CAUTERY HOOK MNPLR 1.6 DVNC XI (INSTRUMENTS) ×1 IMPLANT
CHLORAPREP W/TINT 26 (MISCELLANEOUS) ×1 IMPLANT
CLIP LIGATING HEM O LOK PURPLE (MISCELLANEOUS) ×1 IMPLANT
CLIP LIGATING HEMO LOK XL GOLD (MISCELLANEOUS) ×1 IMPLANT
CLIP LIGATING HEMO O LOK GREEN (MISCELLANEOUS) ×1 IMPLANT
COVER SURGICAL LIGHT HANDLE (MISCELLANEOUS) ×1 IMPLANT
COVER TIP SHEARS 8 DVNC (MISCELLANEOUS) ×1 IMPLANT
DERMABOND ADVANCED .7 DNX12 (GAUZE/BANDAGES/DRESSINGS) IMPLANT
DRAIN CHANNEL 15F RND FF 3/16 (WOUND CARE) IMPLANT
DRAPE ARM DVNC X/XI (DISPOSABLE) ×4 IMPLANT
DRAPE COLUMN DVNC XI (DISPOSABLE) ×1 IMPLANT
DRAPE INCISE IOBAN 66X45 STRL (DRAPES) ×1 IMPLANT
DRAPE SHEET LG 3/4 BI-LAMINATE (DRAPES) ×1 IMPLANT
DRIVER NDL LRG 8 DVNC XI (INSTRUMENTS) ×2 IMPLANT
DRIVER NDLE LRG 8 DVNC XI (INSTRUMENTS) ×2
ELECT REM PT RETURN 15FT ADLT (MISCELLANEOUS) ×2 IMPLANT
EVACUATOR SILICONE 100CC (DRAIN) IMPLANT
FORCEPS PROGRASP DVNC XI (FORCEP) ×2 IMPLANT
GLOVE BIO SURGEON STRL SZ 6.5 (GLOVE) ×1 IMPLANT
GLOVE BIOGEL PI IND STRL 8 (GLOVE) ×2 IMPLANT
GLOVE SURG LX STRL 7.5 STRW (GLOVE) ×2 IMPLANT
GOWN STRL REUS W/ TWL XL LVL3 (GOWN DISPOSABLE) ×2 IMPLANT
HOLDER FOLEY CATH W/STRAP (MISCELLANEOUS) ×1 IMPLANT
IRRIG SUCT STRYKERFLOW 2 WTIP (MISCELLANEOUS) ×1
IRRIGATION SUCT STRKRFLW 2 WTP (MISCELLANEOUS) ×1 IMPLANT
KIT BASIN OR (CUSTOM PROCEDURE TRAY) ×1 IMPLANT
KIT TURNOVER KIT A (KITS) IMPLANT
NDL INSUFFLATION 14GA 120MM (NEEDLE) ×1 IMPLANT
NEEDLE INSUFFLATION 14GA 120MM (NEEDLE) ×1
PROTECTOR NERVE ULNAR (MISCELLANEOUS) ×2 IMPLANT
RELOAD STAPLE 60 2.6 WHT THN (STAPLE) IMPLANT
SCISSORS MNPLR CVD DVNC XI (INSTRUMENTS) ×1 IMPLANT
SEAL UNIV 5-12 XI (MISCELLANEOUS) ×4 IMPLANT
SEALER VESSEL EXT DVNC XI (MISCELLANEOUS) IMPLANT
SET TUBE SMOKE EVAC HIGH FLOW (TUBING) ×1 IMPLANT
SOL ELECTROSURG ANTI STICK (MISCELLANEOUS) ×1
SOLUTION ELECTROSURG ANTI STCK (MISCELLANEOUS) ×1 IMPLANT
SPIKE FLUID TRANSFER (MISCELLANEOUS) ×1 IMPLANT
STAPLE ECHEON FLEX 60 POW ENDO (STAPLE) IMPLANT
STAPLER RELOAD WHITE 60MM (STAPLE)
SURGIFLO W/THROMBIN 8M KIT (HEMOSTASIS) IMPLANT
SUT ETHILON 3 0 PS 1 (SUTURE) IMPLANT
SUT MNCRL AB 4-0 PS2 18 (SUTURE) IMPLANT
SUT PDS AB 1 TP1 54 (SUTURE) IMPLANT
SUT VIC AB 2-0 CT1 27XBRD (SUTURE) IMPLANT
SUT VICRYL 0 UR6 27IN ABS (SUTURE) IMPLANT
SYS BAG RETRIEVAL 10MM (BASKET) ×1
SYSTEM BAG RETRIEVAL 10MM (BASKET) ×1 IMPLANT
TOWEL OR 17X26 10 PK STRL BLUE (TOWEL DISPOSABLE) ×1 IMPLANT
TRAY FOLEY MTR SLVR 16FR STAT (SET/KITS/TRAYS/PACK) ×1 IMPLANT
TRAY LAPAROSCOPIC (CUSTOM PROCEDURE TRAY) ×1 IMPLANT
TROCAR Z THREAD OPTICAL 12X100 (TROCAR) ×1 IMPLANT
TROCAR Z-THREAD FIOS 5X100MM (TROCAR) IMPLANT
WATER STERILE IRR 1000ML POUR (IV SOLUTION) ×1 IMPLANT

## 2023-11-18 NOTE — Anesthesia Postprocedure Evaluation (Signed)
Anesthesia Post Note  Patient: Kelly Thornton  Procedure(s) Performed: XI ROBOTIC LEFT ADRENALECTOMY (Left: Flank)     Patient location during evaluation: PACU Anesthesia Type: General Level of consciousness: awake and alert and oriented Pain management: pain level controlled Vital Signs Assessment: post-procedure vital signs reviewed and stable Respiratory status: spontaneous breathing, nonlabored ventilation and respiratory function stable Cardiovascular status: blood pressure returned to baseline and stable Postop Assessment: no apparent nausea or vomiting Anesthetic complications: no   No notable events documented.  Last Vitals:  Vitals:   11/18/23 1200 11/18/23 1227  BP: 123/61 (!) 120/56  Pulse: 80 83  Resp: 12 16  Temp:  36.4 C  SpO2: 95% 100%    Last Pain:  Vitals:   11/18/23 1227  TempSrc: Oral  PainSc:                  Jamesina Gaugh A.

## 2023-11-18 NOTE — Transfer of Care (Signed)
Immediate Anesthesia Transfer of Care Note  Patient: Kelly Thornton  Procedure(s) Performed: XI ROBOTIC LEFT ADRENALECTOMY (Left: Flank)  Patient Location: PACU  Anesthesia Type:General  Level of Consciousness: awake, alert , and patient cooperative  Airway & Oxygen Therapy: Patient Spontanous Breathing and Patient connected to face mask oxygen  Post-op Assessment: Report given to RN and Post -op Vital signs reviewed and stable  Post vital signs: Reviewed and stable  Last Vitals:  Vitals Value Taken Time  BP 133/61 11/18/23 1020  Temp 36.6 C 11/18/23 1020  Pulse 98 11/18/23 1022  Resp 15 11/18/23 1022  SpO2 97 % 11/18/23 1022  Vitals shown include unfiled device data.  Last Pain:  Vitals:   11/18/23 1027  TempSrc:   PainSc: 0-No pain      Patients Stated Pain Goal: 6 (11/18/23 2536)  Complications: No notable events documented.

## 2023-11-18 NOTE — Plan of Care (Signed)
Problem: Education: Goal: Knowledge of General Education information will improve Description: Including pain rating scale, medication(s)/side effects and non-pharmacologic comfort measures Outcome: Progressing   Problem: Health Behavior/Discharge Planning: Goal: Ability to manage health-related needs will improve Outcome: Progressing   Problem: Clinical Measurements: Goal: Ability to maintain clinical measurements within normal limits will improve Outcome: Progressing Goal: Will remain free from infection Outcome: Progressing Goal: Diagnostic test results will improve Outcome: Progressing Goal: Respiratory complications will improve Outcome: Progressing Goal: Cardiovascular complication will be avoided Outcome: Progressing   Problem: Activity: Goal: Risk for activity intolerance will decrease Outcome: Progressing   Problem: Nutrition: Goal: Adequate nutrition will be maintained Outcome: Progressing   Problem: Coping: Goal: Level of anxiety will decrease Outcome: Progressing   Problem: Elimination: Goal: Will not experience complications related to bowel motility Outcome: Progressing Goal: Will not experience complications related to urinary retention Outcome: Progressing   Problem: Pain Management: Goal: General experience of comfort will improve Outcome: Progressing   Problem: Safety: Goal: Ability to remain free from injury will improve Outcome: Progressing   Problem: Skin Integrity: Goal: Risk for impaired skin integrity will decrease Outcome: Progressing   Problem: Education: Goal: Knowledge of the prescribed therapeutic regimen will improve Outcome: Progressing   Problem: Bowel/Gastric: Goal: Gastrointestinal status for postoperative course will improve Outcome: Progressing   Problem: Clinical Measurements: Goal: Postoperative complications will be avoided or minimized Outcome: Progressing   Problem: Respiratory: Goal: Ability to achieve and  maintain a regular respiratory rate will improve Outcome: Progressing   Problem: Skin Integrity: Goal: Demonstration of wound healing without infection will improve Outcome: Progressing   Problem: Urinary Elimination: Goal: Ability to avoid or minimize complications of infection will improve Outcome: Progressing Goal: Ability to achieve and maintain urine output will improve Outcome: Progressing

## 2023-11-18 NOTE — Discharge Instructions (Signed)
Activity:  You are encouraged to ambulate frequently (about every hour during waking hours) to help prevent blood clots from forming in your legs or lungs.  However, you should not engage in any heavy lifting (> 10-15 lbs), strenuous activity, or straining. Diet: You should advance your diet as instructed by your physician.  It will be normal to have some bloating, nausea, and abdominal discomfort intermittently. Prescriptions:  You will be provided a prescription for pain medication to take as needed.  If your pain is not severe enough to require the prescription pain medication, you may take extra strength Tylenol instead which will have less side effects.  You should also take a prescribed stool softener to avoid straining with bowel movements as the prescription pain medication may constipate you. Incisions: You may remove your dressing bandages 48 hours after surgery if not removed in the hospital.  You will either have some small staples or special tissue glue at each of the incision sites. Once the bandages are removed (if present), the incisions may stay open to air.  You may start showering (but not soaking or bathing in water) the 2nd day after surgery and the incisions simply need to be patted dry after the shower.  No additional care is needed. What to call us about: You should call the office [phone]) if you develop fever > 101 or develop persistent vomiting.   You may resume aspirin, advil, aleve, vitamins, and supplements 7 days after surgery.

## 2023-11-18 NOTE — Anesthesia Procedure Notes (Signed)
Procedure Name: Intubation Date/Time: 11/18/2023 8:05 AM  Performed by: Elyn Peers, CRNAPre-anesthesia Checklist: Patient identified, Emergency Drugs available, Suction available, Patient being monitored and Timeout performed Patient Re-evaluated:Patient Re-evaluated prior to induction Oxygen Delivery Method: Circle system utilized Preoxygenation: Pre-oxygenation with 100% oxygen Induction Type: IV induction Ventilation: Mask ventilation without difficulty Laryngoscope Size: Miller and 3 Grade View: Grade I Tube type: Oral Tube size: 7.5 mm Number of attempts: 1 Airway Equipment and Method: Stylet Placement Confirmation: ETT inserted through vocal cords under direct vision, positive ETCO2 and breath sounds checked- equal and bilateral Secured at: 22 cm Tube secured with: Tape Dental Injury: Teeth and Oropharynx as per pre-operative assessment

## 2023-11-18 NOTE — Anesthesia Procedure Notes (Signed)
Arterial Line Insertion Start/End11/26/2024 7:05 AM, 11/18/2023 7:10 AM Performed by: Doran Clay, CRNA, CRNA  Patient location: Pre-op. Preanesthetic checklist: patient identified, IV checked, site marked, risks and benefits discussed, surgical consent, monitors and equipment checked, pre-op evaluation, timeout performed and anesthesia consent Lidocaine 1% used for infiltration Right, radial was placed Catheter size: 20 G Hand hygiene performed  and maximum sterile barriers used   Attempts: 1 Procedure performed without using ultrasound guided technique. Following insertion, dressing applied and Biopatch. Post procedure assessment: normal  Patient tolerated the procedure well with no immediate complications. Additional procedure comments: Timeout performed. R radial artery identified. 1% Lidocaine injected into skin. Arrow 20 G used. Good pulse in R radial. Blood flow noted and guided with wire into R radial artery. Biopatch applied. Dressing applied. Patient tolerated procedure well. [name]

## 2023-11-18 NOTE — Op Note (Signed)
Operative Note  Preoperative diagnosis:  1.  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  Cushing syndrome  Postoperative diagnosis: Same  Procedure(s): 1.  Robot-assisted laparoscopic left adrenalectomy  Surgeon: Rhoderick Moody, MD  Assistants: Harrie Foreman, PA-C  An assistant was required for this surgical procedure.  The duties of the assistant included but were not limited to suctioning, passing suture, camera manipulation, retraction.  This procedure would not be able to be performed without an Geophysicist/field seismologist.   Anesthesia:  General  Complications:  None  EBL: 5 mL  Specimens: 1.  Left adrenal gland  Drains/Catheters: 1.  Foley catheter  Intraoperative findings:   Grossly negative margins following excision of left adrenal gland and adenoma  Indication:  Kelly Thornton is a 62 y.o. female with Cushing syndrome likely secondary to a 3.1 cm left adrenal adenoma.  She has been consistent for the above procedures, voices understanding and wishes to proceed.  Description of procedure:  After informed consent was obtained, the patient was brought to the operating room and general endotracheal anesthesia was administered.  A 16 French Foley catheter was then sterilely placed and set to gravity drainage.  The patient was then placed in the right lateral decubitus position and prepped and draped in usual sterile fashion.  A timeout was performed.  An 8 mm incision was then made lateral to the left rectus muscle at the level of the left 12th rib.  Abdominal access was obtained via a Veress needle.  The abdominal cavity was then insufflated up to 15 mmHg.  An 8 mm port was then introduced into the abdominal cavity.  Inspection of the port entry site by the robotic camera revealed no adjacent organ injury.  We then placed 3 additional 8 mm robotic ports to triangulate the left renal hilum.  A 12 mm assistant port was then placed between the carmera port and 3rd robotic arm.  The white line of Toldt  along the descending colon was incised sharply and the colon, along with its mesocolonic fat, was reflected medially until the aorta was identified.  A commendation of blunt dissection and electrocautery was then used to incise the perinephric fat overlying the left renal vein and left adrenal vein.  Hemoclips were then used to ligate the left adrenal vein.  The left adrenal gland and adrenal adenoma was then removed en bloc with a margin of perinephric fat.  Electrocautery was then used to achieve hemostasis in the adrenal gland resection bed.  The specimen was then placed in Endo Catch bag, to be retrieved later in the case.  The robot was then de-docked and the camera was then reinserted into the assistant port. Laparoscopic graspers were then used to grab the string of the Endo Catch bag, which was brought out through the 12 mm assistant port.  The abdomen was then desufflated and all ports were removed.  The assistant port incision was then extended approximately 2 cm and the left adrenal mass, within the Endo Catch bag, was removed and sent to pathology for permanent section.  The fascia within the assistant port incision was then reapproximated using a 0 Vicryl suture.  The remainder of the incisions were then closed using 4-0 Monocryl and dressed appropriately.  Patient tolerated the procedure well and was transferred to the postanesthesia unit in stable condition.  Plan: Monitor on the floor overnight.

## 2023-11-19 ENCOUNTER — Encounter (HOSPITAL_COMMUNITY): Payer: Self-pay | Admitting: Urology

## 2023-11-19 LAB — BASIC METABOLIC PANEL
Anion gap: 10 (ref 5–15)
BUN: 8 mg/dL (ref 8–23)
CO2: 25 mmol/L (ref 22–32)
Calcium: 9.2 mg/dL (ref 8.9–10.3)
Chloride: 105 mmol/L (ref 98–111)
Creatinine, Ser: 0.75 mg/dL (ref 0.44–1.00)
GFR, Estimated: >60 mL/min (ref >60)
Glucose, Bld: 97 mg/dL (ref 70–99)
Potassium: 3.8 mmol/L (ref 3.5–5.1)
Sodium: 140 mmol/L (ref 135–145)

## 2023-11-19 LAB — SURGICAL PATHOLOGY

## 2023-11-19 LAB — HEMOGLOBIN AND HEMATOCRIT, BLOOD
HCT: 53.9 % — ABNORMAL HIGH (ref 36.0–46.0)
Hemoglobin: 18 g/dL — ABNORMAL HIGH (ref 12.0–15.0)

## 2023-11-19 MED ORDER — ONDANSETRON HCL 4 MG PO TABS: 4.0000 mg | ORAL_TABLET | Freq: Every day | ORAL | 1 refills | Status: DC | PRN

## 2023-11-19 NOTE — Progress Notes (Signed)
1 Day Post-Op Subjective: NAEO.  Pain controlled.  Passing flatus.  Hungry.   Objective: Vital signs in last 24 hours: Temp:  [97.5 F (36.4 C)-98.2 F (36.8 C)] 98 F (36.7 C) (11/27 0430) Pulse Rate:  [75-98] 81 (11/27 0430) Resp:  [11-20] 17 (11/27 0430) BP: (101-160)/(53-85) 125/53 (11/27 0430) SpO2:  [93 %-100 %] 98 % (11/27 0430) Arterial Line BP: (169-177)/(59-61) 175/59 (11/26 1100)  Intake/Output from previous day: 11/26 0701 - 11/27 0700 In: 3065.1 [P.O.:840; I.V.:2075.1; IV Piggyback:150] Out: 2020 [Urine:1945; Blood:75]  Intake/Output this shift: No intake/output data recorded.  Physical Exam:  General: Alert and oriented CV: RRR, palpable distal pulses Lungs: CTAB, equal chest rise Abdomen: Soft, NTND, no rebound or guarding Incisions: c/d/i Gu: Foley draining yellow urine Ext: NT, No erythema  Lab Results: Recent Labs    11/18/23 1040 11/19/23 0500  HGB 12.5 18.0*  HCT 36.6 53.9*   BMET Recent Labs    11/19/23 0500  NA 140  K 3.8  CL 105  CO2 25  GLUCOSE 97  BUN 8  CREATININE 0.75  CALCIUM 9.2     Studies/Results: No results found.  Assessment/Plan: POD 1 s/p left robotic adrenalectomy  -d/c Foley and IVF -ADAT -OOBTC and ambulate -Discharge home later today if meeting goals   LOS: 1 day   Kelly Moody, MD [facility] Pager: [phone]  11/19/2023, 8:25 AM

## 2023-11-19 NOTE — Plan of Care (Signed)
  Problem: Education: Goal: Knowledge of General Education information will improve Description: Including pain rating scale, medication(s)/side effects and non-pharmacologic comfort measures Outcome: Adequate for Discharge   Problem: Health Behavior/Discharge Planning: Goal: Ability to manage health-related needs will improve Outcome: Adequate for Discharge   Problem: Clinical Measurements: Goal: Ability to maintain clinical measurements within normal limits will improve Outcome: Adequate for Discharge Goal: Will remain free from infection Outcome: Adequate for Discharge Goal: Diagnostic test results will improve Outcome: Adequate for Discharge Goal: Respiratory complications will improve Outcome: Adequate for Discharge Goal: Cardiovascular complication will be avoided Outcome: Adequate for Discharge   Problem: Activity: Goal: Risk for activity intolerance will decrease Outcome: Adequate for Discharge   Problem: Nutrition: Goal: Adequate nutrition will be maintained Outcome: Adequate for Discharge   Problem: Coping: Goal: Level of anxiety will decrease Outcome: Adequate for Discharge   Problem: Elimination: Goal: Will not experience complications related to bowel motility Outcome: Adequate for Discharge Goal: Will not experience complications related to urinary retention Outcome: Adequate for Discharge   Problem: Pain Management: Goal: General experience of comfort will improve Outcome: Adequate for Discharge   Problem: Safety: Goal: Ability to remain free from injury will improve Outcome: Adequate for Discharge   Problem: Skin Integrity: Goal: Risk for impaired skin integrity will decrease Outcome: Adequate for Discharge   Problem: Education: Goal: Knowledge of the prescribed therapeutic regimen will improve Outcome: Adequate for Discharge   Problem: Bowel/Gastric: Goal: Gastrointestinal status for postoperative course will improve Outcome: Adequate for  Discharge   Problem: Clinical Measurements: Goal: Postoperative complications will be avoided or minimized Outcome: Adequate for Discharge   Problem: Respiratory: Goal: Ability to achieve and maintain a regular respiratory rate will improve Outcome: Adequate for Discharge   Problem: Skin Integrity: Goal: Demonstration of wound healing without infection will improve Outcome: Adequate for Discharge   Problem: Urinary Elimination: Goal: Ability to avoid or minimize complications of infection will improve Outcome: Adequate for Discharge Goal: Ability to achieve and maintain urine output will improve Outcome: Adequate for Discharge

## 2023-11-19 NOTE — TOC Transition Note (Signed)
Transition of Care [facility]) - CM/SW Discharge Note   Patient Details  Name: Kelly Thornton MRN: [id number] Date of Birth: 12/22/1961  [facility]) CM/SW Contact:  Lanier Clam, RN Phone Number: 11/19/2023, 8:44 AM   Clinical Narrative:  d/c home no needs or orders.     Final next level of care: Home/Self Care Barriers to Discharge: No Barriers Identified   Patient Goals and CMS Choice CMS Medicare.gov Compare Post Acute Care list provided to:: Patient Choice offered to / list presented to : Patient  Discharge Placement                         Discharge Plan and Services Additional resources added to the After Visit Summary for     Discharge Planning Services: CM Consult Post Acute Care Choice: Resumption of Svcs/PTA Provider                               Social Determinants of Health (SDOH) Interventions SDOH Screenings   Food Insecurity: No Food Insecurity (11/18/2023)  Housing: Low Risk  (11/18/2023)  Transportation Needs: No Transportation Needs (11/18/2023)  Utilities: Not At Risk (11/18/2023)  Social Connections: Unknown (04/30/2023)   Received from [facility], [facility]  Tobacco Use: Low Risk  (11/18/2023)     Readmission Risk Interventions     No data to display

## 2023-11-19 NOTE — Progress Notes (Signed)
Discharge summary reviewed with patient with acknowledgement. Skin tear to left AC as PIV dressing was removed. Foam dressing applied. Instructions provided.

## 2023-11-20 ENCOUNTER — Other Ambulatory Visit: Payer: Self-pay

## 2023-11-20 ENCOUNTER — Encounter (HOSPITAL_COMMUNITY): Payer: Self-pay

## 2023-11-20 ENCOUNTER — Emergency Department (HOSPITAL_COMMUNITY)
Admission: EM | Admit: 2023-11-20 | Discharge: 2023-11-20 | Disposition: A | Discharge status: Home / Self Care | Payer: No Typology Code available for payment source

## 2023-11-20 DIAGNOSIS — R21 Rash and other nonspecific skin eruption: Secondary | ICD-10-CM | POA: Diagnosis present

## 2023-11-20 DIAGNOSIS — L259 Unspecified contact dermatitis, unspecified cause: Secondary | ICD-10-CM | POA: Diagnosis not present

## 2023-11-20 DIAGNOSIS — L231 Allergic contact dermatitis due to adhesives: Secondary | ICD-10-CM

## 2023-11-20 MED ORDER — HYDROCORTISONE 1 % EX CREA: TOPICAL_CREAM | CUTANEOUS | 0 refills | Status: DC

## 2023-11-20 NOTE — Discharge Instructions (Signed)
Today you were seen for contact dermatitis.  Please pick up your medication and use as prescribed.  Thank you for letting us treat you today. After performing a physical exam, I feel you are safe to go home. Please follow up with your PCP in the next several days and provide them with your records from this visit. Return to the Emergency Room if pain becomes severe or symptoms worsen.

## 2023-11-20 NOTE — ED Triage Notes (Signed)
Patient reports left sided rash since Tuesday after her adrenal gland was removed. Patient is allergic to adhesive. Possibly from sterile drape cover.

## 2023-11-20 NOTE — ED Provider Notes (Signed)
[place] EMERGENCY DEPARTMENT AT [facility] Provider Note   CSN: [id number] Arrival date & time: 11/20/23  1636     History  Chief Complaint  Patient presents with   Rash    Kelly Thornton is a 62 y.o. female past medical history significant for adrenalectomy Tuesday.  Patient reports left-sided rash since after surgery.  Patient is allergic to adhesive and believes it could possibly be from the sterile drape cover.  Patient denies shortness of breath, fever, headache, chest pain, any other areas of concern.  Patient endorses rash, warmth and pruritus.   Rash      Home Medications Prior to Admission medications   Medication Sig Start Date End Date Taking? Authorizing Provider  hydrocortisone cream 1 % Apply to affected area 2 times daily 11/20/23  Yes Dolphus Jenny, PA-C  albuterol (PROVENTIL HFA;VENTOLIN HFA) 108 (90 BASE) MCG/ACT inhaler Inhale 2 puffs into the lungs every 6 (six) hours as needed for wheezing or shortness of breath.    [provider]  albuterol (PROVENTIL) (2.5 MG/3ML) 0.083% nebulizer solution Take 2.5 mg by nebulization every 6 (six) hours as needed for wheezing or shortness of breath.    [provider]  allopurinol (ZYLOPRIM) 300 MG tablet Take 300 mg by mouth daily. 04/07/23   [provider]  amitriptyline (ELAVIL) 100 MG tablet Take 100 mg by mouth at bedtime.    [provider]  budesonide-formoterol (SYMBICORT) 80-4.5 MCG/ACT inhaler Take 2 puffs first thing in am and then another 2 puffs about 12 hours later. 11/17/23   Nyoka Cowden, MD  carboxymethylcellulose 1 % ophthalmic solution Place 1 drop into both eyes 2 (two) times daily.    [provider]  carvedilol (COREG) 12.5 MG tablet TAKE 1 TABLET BY MOUTH TWICE  DAILY 06/23/23   Camnitz, Andree Coss, MD  cetirizine (ZYRTEC) 10 MG tablet Take 10 mg by mouth 2 (two) times daily.    [provider]  ciclopirox (LOPROX) 0.77 % SUSP  Apply 1 application. topically 2 (two) times daily. Q am Patient taking differently: Apply 1 application  topically 2 (two) times daily as needed (irritation). Q am 05/02/22   Mackey Birchwood R, PA-C  clobetasol (TEMOVATE) 0.05 % external solution Apply 1 application. topically 2 (two) times daily. For scalp at night- not for face. Patient taking differently: Apply 1 application  topically 2 (two) times daily as needed (irritation). For scalp at night- not for face. 05/02/22   Sheffield, Judye Bos, PA-C  cromolyn (NASALCROM) 5.2 MG/ACT nasal spray Place 1 spray into both nostrils in the morning and at bedtime.    [provider]  desvenlafaxine (PRISTIQ) 100 MG 24 hr tablet Take 100 mg by mouth daily. 11/12/22   [provider]  docusate sodium (COLACE) 100 MG capsule Take 100 mg by mouth 2 (two) times daily.    [provider]  EPINEPHrine 0.3 mg/0.3 mL IJ SOAJ injection Inject 0.3 mg into the muscle as needed for anaphylaxis ("from seafood").     [provider]  Eyelid Cleansers (AVENOVA) 0.01 % SOLN Place 1 application into both eyes 2 (two) times daily.    [provider]  fluticasone (FLONASE) 50 MCG/ACT nasal spray Place 2 sprays into both nostrils at bedtime.    [provider]  irbesartan (AVAPRO) 75 MG tablet Take 75 mg by mouth daily. 01/21/22   [provider]  levothyroxine (SYNTHROID) 175 MCG tablet Take 175 mcg by mouth daily  before breakfast. 04/08/23   [provider]  mexiletine (MEXITIL) 150 MG capsule TAKE 1 CAPSULE BY MOUTH TWICE  DAILY 04/14/23   Camnitz, Andree Coss, MD  NASAL SALINE NA Place 2 sprays into both nostrils daily as needed (for dryness or congestion).     [provider]  nystatin-triamcinolone ointment (MYCOLOG) Apply 1 Application topically 2 (two) times daily. Patient taking differently: Apply 1 Application topically 2 (two) times daily as needed (irritation). 11/12/22   Olivia Mackie, NP  ondansetron (ZOFRAN) 4 MG tablet Take 1 tablet (4 mg total) by mouth daily as needed for nausea or vomiting. 11/19/23 11/18/24  Rene Paci, MD  Pentosan Polysulfate Sodium 200 MG CPDR Take 200 mg by mouth 2 (two) times daily.    [provider]  rosuvastatin (CRESTOR) 5 MG tablet Take 5 mg by mouth at bedtime.     [provider]  Simethicone (GAS-X ULTRA STRENGTH PO) Take 250 mg by mouth in the morning and at bedtime.    [provider]  traMADol (ULTRAM) 50 MG tablet Take 1-2 tablets (50-100 mg total) by mouth every 6 (six) hours as needed for moderate pain (pain score 4-6) or severe pain (pain score 7-10). 11/18/23   Harrie Foreman, PA-C  triamterene-hydrochlorothiazide (MAXZIDE-25) 37.5-25 MG tablet Take 1 tablet by mouth daily.    [provider]      Allergies    Isopropyl alcohol, Mineral oil, Monosodium glutamate, Nickel, Other, Pneumovax [pneumococcal polysaccharide vaccine], Procaine, Rubbing alcohol [alcohol], Shellfish allergy, Silicone dioxide [silica], Ace inhibitors, Avelox [moxifloxacin hcl in nacl], Cephalosporins, Chocolate, Clofexamide, Cocoa, Codeine, Darvon [propoxyphene], Flavoring agent, Fluorescein-benoxinate, Hydrocodone-acetaminophen, Lisinopril, Silicone, Simvastatin, Tape, Tetracyclines & related, Bextra [valdecoxib], Biaxin [clarithromycin], Ceclor [cefaclor], Doxycycline, Erythromycin, Hibiclens [chlorhexidine gluconate], Keflex [cephalexin], Penicillins, Povidone-iodine, and Thimerosal (thiomersal)    Review of Systems   Review of Systems  Skin:  Positive for rash.    Physical Exam Updated Vital Signs BP 123/70 (BP Location: Left Arm)   Pulse (!) 107   Temp 98.9 F (37.2 C) (Oral)   Resp 18   Ht 5\' 7"  (1.702 m)   Wt 124.1 kg   SpO2 99%   BMI 42.85 kg/m  Physical Exam Vitals and nursing note reviewed.  Constitutional:      General: She is not in acute distress.    Appearance: Normal appearance. She  is well-developed.  HENT:     Head: Normocephalic and atraumatic.     Right Ear: External ear normal.     Left Ear: External ear normal.     Nose: Nose normal.     Mouth/Throat:     Mouth: Mucous membranes are moist.     Pharynx: Oropharynx is clear.  Eyes:     Conjunctiva/sclera: Conjunctivae normal.  Cardiovascular:     Rate and Rhythm: Regular rhythm. Tachycardia present.     Heart sounds: No murmur heard.    Comments: Mild tachycardia on exam Pulmonary:     Effort: Pulmonary effort is normal. No respiratory distress.     Breath sounds: Normal breath sounds.  Abdominal:     Palpations: Abdomen is soft.     Tenderness: There is no abdominal tenderness.  Musculoskeletal:        General: No swelling.     Cervical back: Neck supple. No rigidity.  Skin:    General: Skin is warm and dry.     Capillary Refill: Capillary refill takes less than 2 seconds.     Findings: Bruising  and rash present.     Comments: Mild bruising the incision sites.  Incision sites without obvious sign of infection. Erythematous macules in a linear fashion outlining patient's surgical incision sites.  Neurological:     Mental Status: She is alert.  Psychiatric:        Mood and Affect: Mood normal.     ED Results / Procedures / Treatments   Labs (all labs ordered are listed, but only abnormal results are displayed) Labs Reviewed - No data to display  EKG None  Radiology No results found.  Procedures Procedures    Medications Ordered in ED Medications - No data to display  ED Course/ Medical Decision Making/ A&P                                 Medical Decision Making  This patient presents to the ED with chief complaint(s) of rash with pertinent past medical history of adrenalectomy which further complicates the presenting complaint. The complaint involves an extensive differential diagnosis and also carries with it a high risk of complications and morbidity.    The differential  diagnosis includes contact dermatitis, shingles  Additional history obtained: Additional history obtained from family Records reviewed Primary Care Documents  ED Course and Reassessment:   Consultation: - Consulted or discussed management/test interpretation w/ external professional: None  Consideration for admission or further workup: Considered for admission or further workup however patients vital signs have been exam are reassuring.  Patient's symptoms most likely due to contact dermatitis with known adhesive allergy.  Patient given outpatient treatment of hydrocortisone cream.        Final Clinical Impression(s) / ED Diagnoses Final diagnoses:  Contact dermatitis due to adhesives, unspecified contact dermatitis type    Rx / DC Orders ED Discharge Orders          Ordered    hydrocortisone cream 1 %        11/20/23 1724              Dolphus Jenny, PA-C 11/20/23 1725    Coral Spikes, DO 11/20/23 1851

## 2023-12-01 NOTE — Discharge Summary (Signed)
Date of admission: 11/18/2023  Date of discharge: 11/19/2023  Admission diagnosis: Left adrenal adenoma  Discharge diagnosis: Left adrenal adenoma  Surger:  Left robotic adrenalectomy   History and Physical: For full details, please see admission history and physical. Briefly, Kelly Thornton is a 62 y.o. 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   Hospital Course: Routine post-op course following left robotic adrenalectomy   Physical Exam:  General: Alert and oriented CV: RRR, palpable distal pulses Lungs: CTAB, equal chest rise Abdomen: Soft, NTND, no rebound or guarding Incisions: c/d/i Ext: NT, No erythema  Laboratory values: No results for input(s): "HGB", "HCT" in the last 72 hours. No results for input(s): "CREATININE" in the last 72 hours.  Disposition: Home  Discharge instruction: The patient was instructed to be ambulatory but told to refrain from heavy lifting, strenuous activity, or driving.  Discharge medications:  Allergies as of 11/19/2023       Reactions   Isopropyl Alcohol Rash   Mineral Oil Rash   Monosodium Glutamate Nausea And Vomiting   Nickel Rash   Other Anaphylaxis, Other (See Comments)   Other reaction(s): Other (See Comments) gastritis redness PNEUMO VAX, RUBBING ALCOHOL, MINERAL OIL, NICKEL, MSG, SILICON- REACTION  gastritis redness PNEUMO VAX, RUBBING ALCOHOL, MINERAL OIL, NICKEL, MSG, SILICON- REACTION  GI upset   Pneumovax [pneumococcal Polysaccharide Vaccine] Rash   Procaine Shortness Of Breath, Rash   Rubbing Alcohol [alcohol] Other (See Comments)   "burns skin"   Shellfish Allergy Anaphylaxis   Silicone Dioxide [silica] Anaphylaxis, Rash   Ace Inhibitors Swelling   Lips swell   Avelox [moxifloxacin Hcl In Nacl] Other (See Comments)   Gastritis   Cephalosporins    Other reaction(s): Unknown   Chocolate Other (See Comments)   Sneezing   Clofexamide    Other reaction(s): Unknown   Cocoa Other  (See Comments)   Sneezing   Codeine Nausea Only, Other (See Comments)   GI upset   Darvon [propoxyphene] Other (See Comments)   GI Upset   Flavoring Agent    Other reaction(s): Unknown   Fluorescein-benoxinate Other (See Comments)   Eye irritation   Hydrocodone-acetaminophen Other (See Comments)   GI upset *Vicodin    Lisinopril Swelling   Tongue and lips became swollen   Silicone    Other reaction(s): Unknown   Simvastatin Other (See Comments)   Leg cramps   Tape Other (See Comments)   Redness Other reaction(s): Unknown   Tetracyclines & Related Other (See Comments)   GI upset   Bextra [valdecoxib] Rash   Biaxin [clarithromycin] Rash   Ceclor [cefaclor] Rash   Doxycycline Rash   Erythromycin Rash   Hibiclens [chlorhexidine Gluconate] Rash   Keflex [cephalexin] Rash   Penicillins Rash   Did it involve swelling of the face/tongue/throat, SOB, or low BP? Yes Did it involve sudden or severe rash/hives, skin peeling, or any reaction on the inside of your mouth or nose? No Did you need to seek medical attention at a hospital or doctor's office? No When did it last happen? "I was a kid"   If all above answers are "NO", may proceed with cephalosporin use.   Povidone-iodine Rash      Thimerosal (thiomersal) Rash        Medication List     STOP taking these medications    multivitamin capsule   Semaglutide (1 MG/DOSE) 2 MG/1.5ML Sopn   Vitamin D 50 MCG (2000 UT) tablet  TAKE these medications    albuterol (2.5 MG/3ML) 0.083% nebulizer solution Commonly known as: PROVENTIL Take 2.5 mg by nebulization every 6 (six) hours as needed for wheezing or shortness of breath.   albuterol 108 (90 Base) MCG/ACT inhaler Commonly known as: VENTOLIN HFA Inhale 2 puffs into the lungs every 6 (six) hours as needed for wheezing or shortness of breath.   allopurinol 300 MG tablet Commonly known as: ZYLOPRIM Take 300 mg by mouth daily.   amitriptyline 100 MG  tablet Commonly known as: ELAVIL Take 100 mg by mouth at bedtime.   Avenova 0.01 % Soln Place 1 application into both eyes 2 (two) times daily.   budesonide-formoterol 80-4.5 MCG/ACT inhaler Commonly known as: Symbicort Take 2 puffs first thing in am and then another 2 puffs about 12 hours later.   carboxymethylcellulose 1 % ophthalmic solution Place 1 drop into both eyes 2 (two) times daily.   carvedilol 12.5 MG tablet Commonly known as: COREG TAKE 1 TABLET BY MOUTH TWICE  DAILY   cetirizine 10 MG tablet Commonly known as: ZYRTEC Take 10 mg by mouth 2 (two) times daily.   ciclopirox 0.77 % Susp Commonly known as: LOPROX Apply 1 application. topically 2 (two) times daily. Q am What changed:  when to take this reasons to take this   clobetasol 0.05 % external solution Commonly known as: TEMOVATE Apply 1 application. topically 2 (two) times daily. For scalp at night- not for face. What changed:  when to take this reasons to take this   cromolyn 5.2 MG/ACT nasal spray Commonly known as: NASALCROM Place 1 spray into both nostrils in the morning and at bedtime.   desvenlafaxine 100 MG 24 hr tablet Commonly known as: PRISTIQ Take 100 mg by mouth daily.   docusate sodium 100 MG capsule Commonly known as: COLACE Take 100 mg by mouth 2 (two) times daily.   EPINEPHrine 0.3 mg/0.3 mL Soaj injection Commonly known as: EPI-PEN Inject 0.3 mg into the muscle as needed for anaphylaxis ("from seafood").   fluticasone 50 MCG/ACT nasal spray Commonly known as: FLONASE Place 2 sprays into both nostrils at bedtime.   GAS-X ULTRA STRENGTH PO Take 250 mg by mouth in the morning and at bedtime.   irbesartan 75 MG tablet Commonly known as: AVAPRO Take 75 mg by mouth daily.   levothyroxine 175 MCG tablet Commonly known as: SYNTHROID Take 175 mcg by mouth daily before breakfast.   mexiletine 150 MG capsule Commonly known as: MEXITIL TAKE 1 CAPSULE BY MOUTH TWICE  DAILY    NASAL SALINE NA Place 2 sprays into both nostrils daily as needed (for dryness or congestion).   nystatin-triamcinolone ointment Commonly known as: MYCOLOG Apply 1 Application topically 2 (two) times daily. What changed:  when to take this reasons to take this   ondansetron 4 MG tablet Commonly known as: Zofran Take 1 tablet (4 mg total) by mouth daily as needed for nausea or vomiting.   Pentosan Polysulfate Sodium 200 MG Cpdr Take 200 mg by mouth 2 (two) times daily.   rosuvastatin 5 MG tablet Commonly known as: CRESTOR Take 5 mg by mouth at bedtime.   traMADol 50 MG tablet Commonly known as: Ultram Take 1-2 tablets (50-100 mg total) by mouth every 6 (six) hours as needed for moderate pain (pain score 4-6) or severe pain (pain score 7-10).   triamterene-hydrochlorothiazide 37.5-25 MG tablet Commonly known as: MAXZIDE-25 Take 1 tablet by mouth daily.  Discharge Care Instructions  (From admission, onward)           Start     Ordered   11/19/23 0000  Discharge wound care:       Comments: Shower daily.  Keep incisions clean and dry.   11/19/23 0825            Followup:   Follow-up Information     Rene Paci, MD Follow up on 12/03/2023.   Specialty: Urology Why: at 8:00 Contact information: [address] [place] [address] [phone]

## 2024-02-26 ENCOUNTER — Encounter: Payer: Self-pay | Admitting: Nurse Practitioner

## 2024-03-02 NOTE — Progress Notes (Unsigned)
   Kelly Thornton Apr 29, 1961 [id number]   History:  63 y.o. G0 presents for annual exam. Premature ovarian insufficiency at age 50, on Depo for years after this, was on HRT at some point, no bleeding. H/O chronic vulvitis. H/O endometriosis, benign endometrial polyps. H/O DM, HLD, HTN, hypothyroidism, Sjogren's.   Gynecologic History No LMP recorded. Patient is postmenopausal.   Contraception/Family planning: post menopausal status Sexually active: Yes  Health Maintenance Last Pap: 11/12/2022. Results were: Normal neg HPV Last mammogram: 12/02/2022. Results were: Normal Last colonoscopy: 08/15/2020. Results were: Ischemic colitis, 5-year recall Last Dexa: 01/28/2002. Results were: Normal  Past medical history, past surgical history, family history and social history were all reviewed and documented in the EPIC chart. Married. 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 Husband works for [employer].   ROS:  A ROS was performed and pertinent positives and negatives are included.  Exam:  There were no vitals filed for this visit.  There is no height or weight on file to calculate BMI.  General appearance:  Normal Thyroid:  Symmetrical, normal in size, without palpable masses or nodularity. Respiratory  Auscultation:  Clear without wheezing or rhonchi Cardiovascular  Auscultation:  Regular rate, without rubs, murmurs or gallops  Edema/varicosities:  Not grossly evident Abdominal  Soft,nontender, without masses, guarding or rebound.  Liver/spleen:  No organomegaly noted  Hernia:  None appreciated  Skin  Inspection:  Grossly normal Breasts: Examined lying and sitting.   Right: Without masses, retractions, nipple discharge or axillary adenopathy.   Left: Without masses, retractions, nipple discharge or axillary adenopathy. Pelvic: External genitalia:  no lesions              Urethra:  normal appearing urethra with no masses, tenderness or lesions              Bartholins and Skenes: normal                  Vagina: normal appearing vagina with normal color and discharge, no lesions              Cervix: no lesions Bimanual Exam:  Uterus:  no masses or tenderness              Adnexa: no mass, fullness, tenderness              Rectovaginal: Deferred              Anus:  normal, no lesions  Patient informed chaperone available to be present for breast and pelvic exam. Patient has requested no chaperone to be present. Patient has been advised what will be completed during breast and pelvic exam.   Assessment/Plan:  63 y.o. G0 to establish care.   Well female exam with routine gynecological exam - Education provided on SBEs, importance of preventative screenings, current guidelines, high calcium diet, regular exercise, and multivitamin daily. Labs with PCP.   Postmenopausal - POI at age 51. Was on HRT briefly in her 33s. No bleeding.   Chronic vulvitis - Plan: nystatin-triamcinolone ointment (MYCOLOG) as needed.  Screening for cervical cancer - Normal Pap history.  Will repeat at 5-year interval per guidelines.   Screening for breast cancer - Normal mammogram history.  Continue annual screenings.  Normal breast exam today.  Screening for colon cancer - 2021 colonoscopy. Will repeat at 5-year interval per GI's recommendation.   Screening for osteoporosis -   No follow-ups on file.    Olivia Mackie DNP, 3:46 PM 03/02/2024

## 2024-03-03 ENCOUNTER — Ambulatory Visit (INDEPENDENT_AMBULATORY_CARE_PROVIDER_SITE_OTHER): Payer: Managed Care, Other (non HMO) | Admitting: Nurse Practitioner

## 2024-03-03 ENCOUNTER — Encounter: Payer: Self-pay | Admitting: Nurse Practitioner

## 2024-03-03 VITALS — BP 112/62 | HR 68 | Ht 67.5 in | Wt 253.0 lb

## 2024-03-03 DIAGNOSIS — Z01419 Encounter for gynecological examination (general) (routine) without abnormal findings: Secondary | ICD-10-CM | POA: Diagnosis not present

## 2024-03-03 DIAGNOSIS — N763 Subacute and chronic vulvitis: Secondary | ICD-10-CM

## 2024-03-03 DIAGNOSIS — E2839 Other primary ovarian failure: Secondary | ICD-10-CM

## 2024-03-03 DIAGNOSIS — Z13820 Encounter for screening for osteoporosis: Secondary | ICD-10-CM

## 2024-03-03 DIAGNOSIS — Z780 Asymptomatic menopausal state: Secondary | ICD-10-CM

## 2024-03-03 DIAGNOSIS — Z1331 Encounter for screening for depression: Secondary | ICD-10-CM

## 2024-03-03 MED ORDER — NYSTATIN-TRIAMCINOLONE 100000-0.1 UNIT/GM-% EX OINT
1.0000 | TOPICAL_OINTMENT | Freq: Two times a day (BID) | CUTANEOUS | 2 refills | Status: AC
Start: 2024-03-03 — End: ?

## 2024-03-04 ENCOUNTER — Encounter: Payer: Self-pay | Admitting: Nurse Practitioner

## 2024-03-07 NOTE — Progress Notes (Unsigned)
  Cardiology Office Note:  .   Date:  03/07/2024  ID:  Kelly Thornton, DOB [birth year], MRN [id number] PCP: Darrow Bussing, MD  [facility] Providers Cardiologist:  None Electrophysiologist:  Will Jorja Loa, MD {  History of Present Illness: .   Kelly Thornton is a 63 y.o. female w/PMHx of  DM, HTM, HLD, Sjogren's, Fibromyalgia, Menire's  PVCs  She saw Dr. Elberta Fortis 03/07/23, f/u on hx of PVCs. Discussed hx of burden of 25%, preserved LVEF, treated with flecainide > poorly tolerated > mexiletine Working on weight loss with hops to qualify for bariatric sx, recently found with ischemic bowel Cardiology-wise, doing well   11/18/23 Preoperative diagnosis:  1.  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  Cushing syndrome Postoperative diagnosis: Same Procedure(s): 1.  Robot-assisted laparoscopic left adrenalectomy  Today's visit is scheduled as an annual visit  ROS:   She is doing well with no CP, palpitations or cardiac awareness Though never really was aware of her PVCs. No SOB, denies DOE  She reports a syncopal event Jan 2025, had gotten out of bed > bathroom and on her way back fainted. She doesn't recall any specifics Did not seek help, saw her PMD a couple weeks afterwards who felt was orthostatic  No recurrent syncope, no dizzy spells or near syncope   Arrhythmia/AAD hx PVCs Flecainide > intolerant (?), unchanged PVC burden stopped 2020 Mexiletine started 2020  Studies Reviewed: [name]    EKG done today and reviewed by myself:  SR 74bpm, normal intervals, no PVCs, low voltage, not new  TTE 01/16/18  - Left ventricle: The cavity size was normal. Wall thickness was   increased in a pattern of mild LVH. Systolic function was normal.   The estimated ejection fraction was in the range of 55% to 60%.   The study is not technically sufficient to allow evaluation of LV   diastolic function.   Holter 01/04/18  NSR Average HR 101 range 78-141 bpm Frequent PVCls and NSVT  longest range 12 beats Ventricular ectopy 25% total beats Refer to EP for suppressive Rx   Risk Assessment/Calculations:    Physical Exam:   VS:  There were no vitals taken for this visit.   Wt Readings from Last 3 Encounters:  03/03/24 253 lb (114.8 kg)  11/20/23 273 lb 9.6 oz (124.1 kg)  11/18/23 273 lb 9.6 oz (124.1 kg)    GEN: Well nourished, well developed in no acute distress NECK: No JVD; No carotid bruits CARDIAC: RRR, no murmurs, rubs, gallops RESPIRATORY:   CTA b/l without rales, wheezing or rhonchi  ABDOMEN: Soft, non-tender, non-distended EXTREMITIES:  No edema; No deformity   ASSESSMENT AND PLAN: .    PVCs None on exam or EKG Chronic mexiletine/carvedilol  HTN Looks ok  3. Syncope Unclear, she doesn't recall specfics PMD saw her back in Jan felt to have been orthostatic Occurred on the way back from using the BR, so perhaps Advised to notify af any symptoms syncope  She mentions being told at some point that her K+ has been low Will check BMET given her maxide    Dispo: back in 21mo again, sooner if needed  Signed, Sheilah Pigeon, PA-C

## 2024-03-08 ENCOUNTER — Encounter: Payer: Self-pay | Admitting: Physician Assistant

## 2024-03-08 ENCOUNTER — Ambulatory Visit: Payer: No Typology Code available for payment source | Attending: Physician Assistant | Admitting: Physician Assistant

## 2024-03-08 VITALS — BP 138/76 | HR 80 | Ht 67.0 in | Wt 254.0 lb

## 2024-03-08 DIAGNOSIS — I10 Essential (primary) hypertension: Secondary | ICD-10-CM

## 2024-03-08 DIAGNOSIS — I493 Ventricular premature depolarization: Secondary | ICD-10-CM

## 2024-03-08 DIAGNOSIS — Z79899 Other long term (current) drug therapy: Secondary | ICD-10-CM | POA: Diagnosis not present

## 2024-03-08 DIAGNOSIS — R55 Syncope and collapse: Secondary | ICD-10-CM | POA: Diagnosis not present

## 2024-03-08 LAB — BASIC METABOLIC PANEL
BUN/Creatinine Ratio: 18 (ref 12–28)
BUN: 16 mg/dL (ref 8–27)
CO2: 24 mmol/L (ref 20–29)
Calcium: 9.8 mg/dL (ref 8.7–10.3)
Chloride: 102 mmol/L (ref 96–106)
Creatinine, Ser: 0.9 mg/dL (ref 0.57–1.00)
Glucose: 106 mg/dL — ABNORMAL HIGH (ref 70–99)
Potassium: 4.4 mmol/L (ref 3.5–5.2)
Sodium: 141 mmol/L (ref 134–144)
eGFR: 71 mL/min/{1.73_m2} (ref >59)

## 2024-03-08 NOTE — Patient Instructions (Addendum)
 Medication Instructions:   Your physician recommends that you continue on your current medications as directed. Please refer to the Current Medication list given to you today.   *If you need a refill on your cardiac medications before your next appointment, please call your pharmacy*   Lab Work:   PLEASE GO DOWN STAIRS  [facility]  FIRST FLOOR  [address] ( GET OFF ELEVATORS MAKE A LEFT AND ANOTHER LEFT LAB ON RIGHT DOWN HALLWAY : BMET TODAY    If you have labs (blood work) drawn today and your tests are completely normal, you will receive your results only by: MyChart Message (if you have MyChart) OR A paper copy in the mail If you have any lab test that is abnormal or we need to change your treatment, we will call you to review the results.   Testing/Procedures:  NONE ORDERED  TODAY      Follow-Up: At [facility], you and your health needs are our priority.  As part of our continuing mission to provide you with exceptional heart care, we have created designated Provider Care Teams.  These Care Teams include your primary Cardiologist (physician) and Advanced Practice Providers (APPs -  Physician Assistants and Nurse Practitioners) who all work together to provide you with the care you need, when you need it.  We recommend signing up for the patient portal called "MyChart".  Sign up information is provided on this After Visit Summary.  MyChart is used to connect with patients for Virtual Visits (Telemedicine).  Patients are able to view lab/test results, encounter notes, upcoming appointments, etc.  Non-urgent messages can be sent to your provider as well.   To learn more about what you can do with MyChart, go to [email or web address].    Your next appointment:    6 month(s)  Provider:    You may see Will Jorja Loa, MD or one of the following Advanced Practice Providers on your designated Care Team:   Francis Dowse, [place]     Other Instructions

## 2024-03-18 ENCOUNTER — Other Ambulatory Visit: Payer: Self-pay | Admitting: Cardiology

## 2024-03-27 ENCOUNTER — Other Ambulatory Visit: Payer: Self-pay | Admitting: Cardiology

## 2024-03-27 DIAGNOSIS — I10 Essential (primary) hypertension: Secondary | ICD-10-CM

## 2024-04-01 ENCOUNTER — Ambulatory Visit (HOSPITAL_BASED_OUTPATIENT_CLINIC_OR_DEPARTMENT_OTHER)
Admission: RE | Admit: 2024-04-01 | Discharge: 2024-04-01 | Disposition: A | Discharge status: Home / Self Care | Source: Ambulatory Visit | Attending: Nurse Practitioner | Admitting: Nurse Practitioner

## 2024-04-01 DIAGNOSIS — Z13820 Encounter for screening for osteoporosis: Secondary | ICD-10-CM | POA: Insufficient documentation

## 2024-04-01 DIAGNOSIS — Z780 Asymptomatic menopausal state: Secondary | ICD-10-CM | POA: Diagnosis present

## 2024-04-01 DIAGNOSIS — E2839 Other primary ovarian failure: Secondary | ICD-10-CM | POA: Diagnosis present

## 2024-04-06 ENCOUNTER — Encounter: Payer: Self-pay | Admitting: Nurse Practitioner

## 2024-04-07 ENCOUNTER — Encounter: Payer: Self-pay | Admitting: Nurse Practitioner

## 2024-06-14 ENCOUNTER — Encounter: Payer: Self-pay | Admitting: Nurse Practitioner

## 2024-08-16 ENCOUNTER — Telehealth: Payer: Self-pay

## 2024-08-16 NOTE — Telephone Encounter (Signed)
   Pre-operative Risk Assessment    Patient Name: Kelly Thornton  DOB: [birth year] MRN: [id number]   Date of last office visit: 03/08/24 RENEE LEVERNE, PA-C Date of next office visit: NONE   Request for Surgical Clearance    Procedure:  LT TOTAL KNEE ARTHROPLASTY  Date of Surgery:  Clearance TBD                                Surgeon:  TORIBIO HIGASHI, MD Surgeon's Group or Practice Name:  [facility] Phone number:  [phone]   EXT 3134 Fax number:  [phone]   ATTN: KELLY HIGH   Type of Clearance Requested:   - Medical    Type of Anesthesia:  Spinal   Additional requests/questions:    SignedLucie DELENA Ku   08/16/2024, 3:53 PM

## 2024-08-17 ENCOUNTER — Telehealth: Payer: Self-pay

## 2024-08-17 NOTE — Telephone Encounter (Signed)
 Patient has been scheduled for televisit med rec and consent done     Patient Consent for Virtual Visit         Kelly Thornton has provided verbal consent on 08/17/2024 for a virtual visit (video or telephone).   CONSENT FOR VIRTUAL VISIT FOR:  Kelly Thornton  By participating in this virtual visit I agree to the following:  I hereby voluntarily request, consent and authorize [facility] and its employed or contracted physicians, physician assistants, nurse practitioners or other licensed health care professionals (the Practitioner), to provide me with telemedicine health care services (the "Services) as deemed necessary by the treating Practitioner. I acknowledge and consent to receive the Services by the Practitioner via telemedicine. I understand that the telemedicine visit will involve communicating with the Practitioner through live audiovisual communication technology and the disclosure of certain medical information by electronic transmission. I acknowledge that I have been given the opportunity to request an in-person assessment or other available alternative prior to the telemedicine visit and am voluntarily participating in the telemedicine visit.  I understand that I have the right to withhold or withdraw my consent to the use of telemedicine in the course of my care at any time, without affecting my right to future care or treatment, and that the Practitioner or I may terminate the telemedicine visit at any time. I understand that I have the right to inspect all information obtained and/or recorded in the course of the telemedicine visit and may receive copies of available information for a reasonable fee.  I understand that some of the potential risks of receiving the Services via telemedicine include:  Delay or interruption in medical evaluation due to technological equipment failure or disruption; Information transmitted may not be sufficient (e.g. poor resolution of  images) to allow for appropriate medical decision making by the Practitioner; and/or  In rare instances, security protocols could fail, causing a breach of personal health information.  Furthermore, I acknowledge that it is my responsibility to provide information about my medical history, conditions and care that is complete and accurate to the best of my ability. I acknowledge that Practitioner's advice, recommendations, and/or decision may be based on factors not within their control, such as incomplete or inaccurate data provided by me or distortions of diagnostic images or specimens that may result from electronic transmissions. I understand that the practice of medicine is not an exact science and that Practitioner makes no warranties or guarantees regarding treatment outcomes. I acknowledge that a copy of this consent can be made available to me via my patient portal [facility] MyChart), or I can request a printed copy by calling the office of [facility].    I understand that my insurance will be billed for this visit.   I have read or had this consent read to me. I understand the contents of this consent, which adequately explains the benefits and risks of the Services being provided via telemedicine.  I have been provided ample opportunity to ask questions regarding this consent and the Services and have had my questions answered to my satisfaction. I give my informed consent for the services to be provided through the use of telemedicine in my medical care

## 2024-08-17 NOTE — Telephone Encounter (Signed)
 Primary Cardiologist:None   Preoperative team, please contact this patient and set up a phone call appointment for further preoperative risk assessment. Please obtain consent and complete medication review. Thank you for your help.   I confirm that guidance regarding antiplatelet and oral anticoagulation therapy has been completed and, if necessary, noted below (None requested).  I also confirmed the patient resides in the state of [place] . As per [facility] Medical Board telemedicine laws, the patient must reside in the state in which the provider is licensed.   Rosaline EMERSON Bane, NP-C  08/17/2024, 10:50 AM [address], [address] [place], [place] [address] Office [phone] Fax [phone]

## 2024-08-17 NOTE — Telephone Encounter (Signed)
 Patient has been scheduled for TELEVISIT

## 2024-08-24 NOTE — Progress Notes (Unsigned)
 Virtual Visit via Telephone Note   Because of Kelly Thornton co-morbid illnesses, she is at least at moderate risk for complications without adequate follow up.  This format is felt to be most appropriate for this patient at this time.  Due to technical limitations with video connection (technology), today's appointment will be conducted as an audio only telehealth visit, and Kelly Thornton verbally agreed to proceed in this manner.   All issues noted in this document were discussed and addressed.  No physical exam could be performed with this format.  Evaluation Performed:  Preoperative cardiovascular risk assessment _____________   Date:  08/24/2024   Patient ID:  Kelly, Thornton [birth year], MRN [id number] Patient Location:  Home Provider location:   Office  Primary Care Provider:  Regino Slater, MD Primary Cardiologist:  None  Chief Complaint / Patient Profile   63 y.o. y/o female with a h/o type 2 diabetes, DOE, tachycardia who is pending left total knee arthroplasty and presents today for telephonic preoperative cardiovascular risk assessment.  History of Present Illness    Kelly Thornton is a 63 y.o. female who presents via audio/video conferencing for a telehealth visit today.  Pt was last seen in cardiology clinic on 03/08/2024 by Charlies Arthur PA-C.  At that time Kelly Thornton was doing well .  The patient is now pending procedure as outlined above. Since her last visit, she remains stable from a cardiac standpoint.  Today she denies chest pain, shortness of breath, lower extremity edema, fatigue, palpitations, melena, hematuria, hemoptysis, diaphoresis, weakness, presyncope, syncope, orthopnea, and PND.   Past Medical History    Past Medical History:  Diagnosis Date   Abnormal uterine bleeding    Anxiety    Arthritis    Cataract    Chronic interstitial cystitis    Colitis, ischemic (HCC)    Cough variant asthma 04/02/2018   FENO 04/02/2018  =   11 on advair 250  one bid - Spirometry 04/02/2018  FEV1 2.39 (84%)  Ratio 87 with truncation of peak flow p advair prior  - 04/02/2018  After extensive coaching inhaler device  effectiveness =    75% from a baseline of 50% so try symbicort  80  1-2 bid     Cushing disease (HCC)    Depression    Diabetes mellitus without complication (HCC)    Dyspnea    Dysrhythmia    Endometriosis    Fibromyalgia    GERD (gastroesophageal reflux disease)    Gout    Hearing loss    Hyperlipidemia    Hypertension    Hypothyroidism    Irritable bowel syndrome (IBS)    Meniere disease    Bilateral   Morbid obesity (HCC)    Nonsustained ventricular tachycardia (HCC)    Pneumonia    PONV (postoperative nausea and vomiting)    PVC (premature ventricular contraction)    Seasonal allergies    Sjogren's disease (HCC)    Wears hearing aid in both ears    Past Surgical History:  Procedure Laterality Date   BIOPSY  08/15/2020   Procedure: BIOPSY;  Surgeon: Dianna Specking, MD;  Location: [facility];  Service: Endoscopy;;   bladder stretching     COLONOSCOPY WITH PROPOFOL  N/A 08/15/2020   Procedure: COLONOSCOPY WITH PROPOFOL ;  Surgeon: Dianna Specking, MD;  Location: [facility];  Service: Endoscopy;  Laterality: N/A;   CYSTOSCOPY     DIAGNOSTIC LAPAROSCOPY     x4   DILATION AND CURETTAGE  OF UTERUS     ENDOMETRIAL BIOPSY     EXCISION OF TONGUE LESION Left 10/02/2018   Procedure: EXCISION OF TONGUE LESION;  Surgeon: Mable Lenis, MD;  Location: [facility] OR;  Service: ENT;  Laterality: Left;   EYE SURGERY     HAMMER TOE SURGERY Right 2023   MYELOGRAM  1985   NASAL SEPTUM SURGERY     papiloma  2014   under tongue   ROBOTIC ADRENALECTOMY Left 11/18/2023   Procedure: XI ROBOTIC LEFT ADRENALECTOMY;  Surgeon: Devere Lonni Righter, MD;  Location: [facility];  Service: Urology;  Laterality: Left;   STAPEDECTOMY     TONSILLECTOMY AND ADENOIDECTOMY     TUBAL LIGATION     WISDOM TOOTH EXTRACTION       Allergies  Allergies  Allergen Reactions   Isopropyl Alcohol Rash   Mineral Oil Rash   Monosodium Glutamate Nausea And Vomiting   Nickel Rash   Other Anaphylaxis and Other (See Comments)    Other reaction(s): Other (See Comments) gastritis redness PNEUMO VAX, RUBBING ALCOHOL, MINERAL OIL, NICKEL, MSG, SILICON- REACTION  gastritis redness PNEUMO VAX, RUBBING ALCOHOL, MINERAL OIL, NICKEL, MSG, SILICON- REACTION  GI upset   Pneumovax [Pneumococcal Polysaccharide Vaccine] Rash   Procaine Shortness Of Breath and Rash   Rubbing Alcohol [Alcohol] Other (See Comments)    burns skin    Shellfish Allergy  Anaphylaxis   Silicone Dioxide [Silica] Anaphylaxis and Rash   Ace Inhibitors Swelling    Lips swell   Avelox [Moxifloxacin Hcl In Nacl] Other (See Comments)    Gastritis   Cephalosporins     Other reaction(s): Unknown   Chocolate Other (See Comments)    Sneezing   Clofexamide     Other reaction(s): Unknown   Cocoa Other (See Comments)    Sneezing   Codeine Nausea Only and Other (See Comments)    GI upset   Darvon [Propoxyphene] Other (See Comments)    GI Upset   Flavoring Agent (Non-Screening)     Other reaction(s): Unknown   Fluorescein-Benoxinate Other (See Comments)    Eye irritation   Hydrocodone-Acetaminophen  Other (See Comments)    GI upset *Vicodin    Lisinopril Swelling    Tongue and lips became swollen   Silicone     Other reaction(s): Unknown   Simvastatin Other (See Comments)    Leg cramps   Tape Other (See Comments)    Redness Other reaction(s): Unknown   Tetracyclines & Related Other (See Comments)    GI upset   Bextra [Valdecoxib] Rash   Biaxin [Clarithromycin] Rash   Ceclor [Cefaclor] Rash   Doxycycline Rash   Erythromycin Rash   Hibiclens  [Chlorhexidine  Gluconate] Rash   Keflex [Cephalexin] Rash   Penicillins Rash    Did it involve swelling of the face/tongue/throat, SOB, or low BP? Yes Did it involve sudden or severe rash/hives, skin  peeling, or any reaction on the inside of your mouth or nose? No Did you need to seek medical attention at a hospital or doctor's office? No When did it last happen? I was a kid   If all above answers are NO, may proceed with cephalosporin use.    Povidone-Iodine Rash        Thimerosal (Thiomersal) Rash    Home Medications    Prior to Admission medications   Medication Sig Start Date End Date Taking? Authorizing Provider  albuterol  (PROVENTIL  HFA;VENTOLIN  HFA) 108 (90 BASE) MCG/ACT inhaler Inhale 2 puffs into the lungs every 6 (six) hours as  needed for wheezing or shortness of breath.    [provider]  albuterol  (PROVENTIL ) (2.5 MG/3ML) 0.083% nebulizer solution Take 2.5 mg by nebulization every 6 (six) hours as needed for wheezing or shortness of breath.    [provider]  allopurinol  (ZYLOPRIM ) 300 MG tablet Take 300 mg by mouth daily. 04/07/23   [provider]  amitriptyline  (ELAVIL ) 100 MG tablet Take 100 mg by mouth at bedtime.    [provider]  budesonide -formoterol  (SYMBICORT ) 80-4.5 MCG/ACT inhaler Take 2 puffs first thing in am and then another 2 puffs about 12 hours later. 11/17/23   Wert, Michael B, MD  carboxymethylcellulose 1 % ophthalmic solution Place 1 drop into both eyes 2 (two) times daily.    [provider]  carvedilol  (COREG ) 12.5 MG tablet TAKE 1 TABLET BY MOUTH TWICE  DAILY 03/29/24   Camnitz, Soyla Lunger, MD  cetirizine (ZYRTEC) 10 MG tablet Take 10 mg by mouth 2 (two) times daily.    [provider]  cholecalciferol (VITAMIN D3) 25 MCG (1000 UNIT) tablet Take by mouth. 06/11/16   [provider]  ciclopirox  (LOPROX ) 0.77 % SUSP Apply 1 application. topically 2 (two) times daily. Q am Patient taking differently: Apply 1 application  topically 2 (two) times daily as needed (irritation). Q am 05/02/22   Sheffield, Kelli R, PA-C  clobetasol  (TEMOVATE ) 0.05 % external solution Apply 1 application.  topically 2 (two) times daily. For scalp at night- not for face. Patient taking differently: Apply 1 application  topically 2 (two) times daily as needed (irritation). For scalp at night- not for face. 05/02/22   Sheffield, Kelli R, PA-C  cromolyn (NASALCROM) 5.2 MG/ACT nasal spray Place 1 spray into both nostrils in the morning and at bedtime.    [provider]  desvenlafaxine (PRISTIQ) 100 MG 24 hr tablet Take 100 mg by mouth daily. 11/12/22   [provider]  docusate sodium  (COLACE) 100 MG capsule Take 100 mg by mouth 2 (two) times daily.    [provider]  EPINEPHrine  0.3 mg/0.3 mL IJ SOAJ injection Inject 0.3 mg into the muscle as needed for anaphylaxis (from seafood).     [provider]  Eyelid Cleansers (AVENOVA) 0.01 % SOLN Place 1 application into both eyes 2 (two) times daily.    [provider]  fluticasone  (FLONASE) 50 MCG/ACT nasal spray Place 2 sprays into both nostrils at bedtime.    [provider]  ketoconazole  (NIZORAL ) 2 % shampoo Apply topically. 01/21/24   [provider]  LEVOXYL  150 MCG tablet  01/24/24   [provider]  mexiletine (MEXITIL ) 150 MG capsule TAKE 1 CAPSULE BY MOUTH TWICE  DAILY 03/18/24   Camnitz, Soyla Lunger, MD  NASAL SALINE NA Place 2 sprays into both nostrils daily as needed (for dryness or congestion).     [provider]  nystatin -triamcinolone  ointment (MYCOLOG) Apply 1 Application topically 2 (two) times daily. 03/03/24   Prentiss Annabella LABOR, NP  Pentosan Polysulfate Sodium 200 MG CPDR Take 200 mg by mouth 2 (two) times daily.    [provider]  rosuvastatin  (CRESTOR ) 5 MG tablet Take 5 mg by mouth at bedtime.     [provider]  Simethicone (GAS-X ULTRA STRENGTH PO) Take 250 mg by mouth in the morning and at bedtime.    [provider]  traMADol  (ULTRAM ) 50 MG tablet Take 1-2 tablets (50-100 mg total) by mouth every 6 (six) hours as needed for  moderate  pain (pain score 4-6) or severe pain (pain score 7-10). Patient not taking: Reported on 03/08/2024 11/18/23   Cory Palma, PA-C  triamterene -hydrochlorothiazide  (MAXZIDE -25) 37.5-25 MG tablet Take 1 tablet by mouth daily.    [provider]    Physical Exam    Vital Signs:  Kelly Thornton does not have vital signs available for review today.  Given telephonic nature of communication, physical exam is limited. AAOx3. NAD. Normal affect.  Speech and respirations are unlabored.  Accessory Clinical Findings    None  Assessment & Plan    1.  Preoperative Cardiovascular Risk Assessment: LT TOTAL KNEE ARTHROPLASTY   Date of Surgery:  Clearance TBD                                  Surgeon:  TORIBIO HIGASHI, MD Surgeon's Group or Practice Name:  [facility] Phone number:  [phone]   EXT 3134 Fax number:  [phone]      Primary Cardiologist: None  Chart reviewed as part of pre-operative protocol coverage. Given past medical history and time since last visit, based on ACC/AHA guidelines, Kelly Thornton would be at acceptable risk for the planned procedure without further cardiovascular testing.   Her RCRI is very low risk, 0.4% risk of major cardiac event.  She is able to complete greater than 4 METS of physical activity.  Patient was advised that if she develops new symptoms prior to surgery to contact our office to arrange a follow-up appointment.  He verbalized understanding.  I will route this recommendation to the requesting party via Epic fax function and remove from pre-op pool.       Time:   Today, I have spent 5 minutes with the patient with telehealth technology discussing medical history, symptoms, and management plan.  I spent 10 minutes reviewing patient's past cardiac history and cardiac medications.    Kelly CHRISTELLA Beauvais, NP  08/24/2024, 12:39 PM

## 2024-08-25 ENCOUNTER — Ambulatory Visit: Attending: Cardiovascular Disease

## 2024-08-25 DIAGNOSIS — Z01810 Encounter for preprocedural cardiovascular examination: Secondary | ICD-10-CM | POA: Diagnosis not present

## 2024-10-14 ENCOUNTER — Encounter (INDEPENDENT_AMBULATORY_CARE_PROVIDER_SITE_OTHER): Payer: Self-pay

## 2024-10-20 NOTE — Patient Instructions (Signed)
 SURGICAL WAITING ROOM VISITATION  Patients having surgery or a procedure may have no more than 2 support people in the waiting area - these visitors may rotate.    Children under the age of 31 must have an adult with them who is not the patient.  Visitors with respiratory illnesses are discouraged from visiting and should remain at home.  If the patient needs to stay at the hospital during part of their recovery, the visitor guidelines for inpatient rooms apply. Pre-op nurse will coordinate an appropriate time for 1 support person to accompany patient in pre-op.  This support person may not rotate.    Please refer to the [facility] website for the visitor guidelines for Inpatients (after your surgery is over and you are in a regular room).    Your procedure is scheduled on: 11/01/24   Report to [facility] Main Entrance    Report to admitting at 5:15 AM   Call this number if you have problems the morning of surgery [phone]   Do not eat food :After Midnight.   After Midnight you may have the following liquids until 4:30 AM DAY OF SURGERY  Water  Non-Citrus Juices (without pulp, NO RED-Apple, White grape, White cranberry) Black Coffee (NO MILK/CREAM OR CREAMERS, sugar ok)  Clear Tea (NO MILK/CREAM OR CREAMERS, sugar ok) regular and decaf                             Plain Jell-O (NO RED)                                           Fruit ices (not with fruit pulp, NO RED)                                     Popsicles (NO RED)                                                               Sports drinks like Gatorade (NO RED)                The day of surgery:  Drink ONE (1) Pre-Surgery Clear Ensure or G2 at AM the morning of surgery. Drink in one sitting. Do not sip.  This drink was given to you during your hospital  pre-op appointment visit. Nothing else to drink after completing the  Pre-Surgery Clear Ensure or G2.          If you have questions, please contact your  surgeon's office.   FOLLOW BOWEL PREP AND ANY ADDITIONAL PRE OP INSTRUCTIONS YOU RECEIVED FROM YOUR SURGEON'S OFFICE!!!     Oral Hygiene is also important to reduce your risk of infection.                                    Remember - BRUSH YOUR TEETH THE MORNING OF SURGERY WITH YOUR REGULAR TOOTHPASTE  DENTURES WILL BE REMOVED PRIOR TO SURGERY PLEASE DO NOT APPLY Poly  grip OR ADHESIVES!!!   Do NOT smoke after Midnight   Stop all vitamins and herbal supplements 7 days before surgery.   Take these medicines the morning of surgery with A SIP OF WATER : Inhalers, Allopurinol , Carvedilol , Zyrtec  DO NOT TAKE ANY ORAL DIABETIC MEDICATIONS DAY OF YOUR SURGERY  Bring CPAP mask and tubing day of surgery.                              You may not have any metal on your body including hair pins, jewelry, and body piercing             Do not wear make-up, lotions, powders, perfumes, or deodorant  Do not wear nail polish including gel and S&S, artificial/acrylic nails, or any other type of covering on natural nails including finger and toenails. If you have artificial nails, gel coating, etc. that needs to be removed by a nail salon please have this removed prior to surgery or surgery may need to be canceled/ delayed if the surgeon/ anesthesia feels like they are unable to be safely monitored.   Do not shave  48 hours prior to surgery.    Do not bring valuables to the hospital. [facility] IS NOT             RESPONSIBLE   FOR VALUABLES.   Contacts, glasses, dentures or bridgework may not be worn into surgery.   Bring small overnight bag day of surgery.   DO NOT BRING YOUR HOME MEDICATIONS TO THE HOSPITAL. PHARMACY WILL DISPENSE MEDICATIONS LISTED ON YOUR MEDICATION LIST TO YOU DURING YOUR ADMISSION IN THE HOSPITAL!   Special Instructions: Bring a copy of your healthcare power of attorney and living will documents the day of surgery if you haven't scanned them before.              Please  read over the following fact sheets you were given: IF YOU HAVE QUESTIONS ABOUT YOUR PRE-OP INSTRUCTIONS PLEASE CALL [phone].   If you received a COVID test during your pre-op visit  it is requested that you wear a mask when out in public, stay away from anyone that may not be feeling well and notify your surgeon if you develop symptoms. If you test positive for Covid or have been in contact with anyone that has tested positive in the last 10 days please notify you surgeon.      Pre-operative 4 CHG Bath Instructions  DYNA-Hex 4 Chlorhexidine  Gluconate 4% Solution Antiseptic 4 fl. oz   You can play a key role in reducing the risk of infection after surgery. Your skin needs to be as free of germs as possible. You can reduce the number of germs on your skin by washing with CHG (chlorhexidine  gluconate) soap before surgery. CHG is an antiseptic soap that kills germs and continues to kill germs even after washing.   DO NOT use if you have an allergy  to chlorhexidine /CHG or antibacterial soaps. If your skin becomes reddened or irritated, stop using the CHG and notify one of our RNs at   Please shower with the CHG soap starting 4 days before surgery using the following schedule:     Please keep in mind the following:  DO NOT shave, including legs and underarms, starting the day of your first shower.   You may shave your face at any point before/day of surgery.  Place clean sheets on your bed the  day you start using CHG soap. Use a clean washcloth (not used since being washed) for each shower. DO NOT sleep with pets once you start using the CHG.  CHG Shower Instructions:  If you choose to wash your hair and private area, wash first with your normal shampoo/soap.  After you use shampoo/soap, rinse your hair and body thoroughly to remove shampoo/soap residue.  Turn the water  OFF and apply about 3 tablespoons (45 ml) of CHG soap to a CLEAN washcloth.  Apply CHG soap ONLY FROM YOUR NECK  DOWN TO YOUR TOES (washing for 3-5 minutes)  DO NOT use CHG soap on face, private areas, open wounds, or sores.  Pay special attention to the area where your surgery is being performed.  If you are having back surgery, having someone wash your back for you may be helpful. Wait 2 minutes after CHG soap is applied, then you may rinse off the CHG soap.  Pat dry with a clean towel  Put on clean clothes/pajamas   If you choose to wear lotion, please use ONLY the CHG-compatible lotions on the back of this paper.     Additional instructions for the day of surgery: DO NOT APPLY any lotions, deodorants, cologne, or perfumes.   Put on clean/comfortable clothes.  Brush your teeth.  Ask your nurse before applying any prescription medications to the skin.   CHG Compatible Lotions   Aveeno Moisturizing lotion  Cetaphil Moisturizing Cream  Cetaphil Moisturizing Lotion  Clairol Herbal Essence Moisturizing Lotion, Dry Skin  Clairol Herbal Essence Moisturizing Lotion, Extra Dry Skin  Clairol Herbal Essence Moisturizing Lotion, Normal Skin  Curel Age Defying Therapeutic Moisturizing Lotion with Alpha Hydroxy  Curel Extreme Care Body Lotion  Curel Soothing Hands Moisturizing Hand Lotion  Curel Therapeutic Moisturizing Cream, Fragrance-Free  Curel Therapeutic Moisturizing Lotion, Fragrance-Free  Curel Therapeutic Moisturizing Lotion, Original Formula  Eucerin Daily Replenishing Lotion  Eucerin Dry Skin Therapy Plus Alpha Hydroxy Crme  Eucerin Dry Skin Therapy Plus Alpha Hydroxy Lotion  Eucerin Original Crme  Eucerin Original Lotion  Eucerin Plus Crme Eucerin Plus Lotion  Eucerin TriLipid Replenishing Lotion  Keri Anti-Bacterial Hand Lotion  Keri Deep Conditioning Original Lotion Dry Skin Formula Softly Scented  Keri Deep Conditioning Original Lotion, Fragrance Free Sensitive Skin Formula  Keri Lotion Fast Absorbing Fragrance Free Sensitive Skin Formula  Keri Lotion Fast Absorbing Softly  Scented Dry Skin Formula  Keri Original Lotion  Keri Skin Renewal Lotion Keri Silky Smooth Lotion  Keri Silky Smooth Sensitive Skin Lotion  Nivea Body Creamy Conditioning Oil  Nivea Body Extra Enriched Teacher, Adult Education Moisturizing Lotion Nivea Crme  Nivea Skin Firming Lotion  NutraDerm 30 Skin Lotion  NutraDerm Skin Lotion  NutraDerm Therapeutic Skin Cream  NutraDerm Therapeutic Skin Lotion  ProShield Protective Hand Cream  Provon moisturizing lotion  View Pre-Surgery Education Videos:  [email or web address]

## 2024-10-20 NOTE — Progress Notes (Signed)
 Please place orders for PAT appointment scheduled 10/21/24.

## 2024-10-20 NOTE — Progress Notes (Signed)
 COVID Vaccine Completed:  Date of COVID positive in last 90 days:  PCP - Dibas Lige, MD Cardiologist -  Electrophysiologist- Soyla Norton, MD Pulmonary- Dominique America, MD LOV 11/17/23    Cardiac clearance by Josefa Beauvais, NP 08/25/24 in Epic   Chest x-ray - 11/17/23 Epic EKG - 03/08/24 Epic Stress Test - N/A ECHO - 01/16/18 Epic Cardiac Cath - n/a Pacemaker/ICD device last checked:N/A Spinal Cord Stimulator:N/A  Bowel Prep - N/A  Sleep Study - N/A CPAP -   Fasting Blood Sugar -  boarderline DM, no checks at home Checks Blood Sugar _____ times a day  Last dose of GLP1 agonist-  Ozempic, Takes on Sundays. Last dose 10/17/24 per pt GLP1 instructions:  Do not take after 10/24/24    Last dose of SGLT-2 inhibitors-  N/A SGLT-2 instructions:  Do not take after     Blood Thinner Instructions: N/A Last dose:   Time: Aspirin Instructions:N/A Last Dose:  Activity level: Can perform activities of daily living without stopping and without symptoms of chest pain or shortness of breath. Ambulating with Rolator or cane at all times. No stair currently due to knee.  Anesthesia review: Raynaud's disease, asthma, DM2, DOE, HTN  Patient denies shortness of breath, fever, cough and chest pain at PAT appointment  Patient verbalized understanding of instructions that were given to them at the PAT appointment. Patient was also instructed that they will need to review over the PAT instructions again at home before surgery.

## 2024-10-21 ENCOUNTER — Ambulatory Visit: Payer: Self-pay | Admitting: Emergency Medicine

## 2024-10-21 ENCOUNTER — Encounter (HOSPITAL_COMMUNITY)
Admission: RE | Admit: 2024-10-21 | Discharge: 2024-10-21 | Disposition: A | Discharge status: Home / Self Care | Source: Ambulatory Visit | Attending: Orthopedic Surgery | Admitting: Orthopedic Surgery

## 2024-10-21 ENCOUNTER — Encounter (HOSPITAL_COMMUNITY): Payer: Self-pay

## 2024-10-21 ENCOUNTER — Other Ambulatory Visit: Payer: Self-pay

## 2024-10-21 VITALS — BP 139/78 | HR 87 | Temp 98.1°F | Resp 16 | Ht 66.5 in | Wt 244.0 lb

## 2024-10-21 DIAGNOSIS — M1712 Unilateral primary osteoarthritis, left knee: Secondary | ICD-10-CM | POA: Insufficient documentation

## 2024-10-21 DIAGNOSIS — Z01812 Encounter for preprocedural laboratory examination: Secondary | ICD-10-CM | POA: Insufficient documentation

## 2024-10-21 DIAGNOSIS — I10 Essential (primary) hypertension: Secondary | ICD-10-CM | POA: Diagnosis not present

## 2024-10-21 DIAGNOSIS — G8929 Other chronic pain: Secondary | ICD-10-CM

## 2024-10-21 DIAGNOSIS — Z01818 Encounter for other preprocedural examination: Secondary | ICD-10-CM | POA: Diagnosis present

## 2024-10-21 DIAGNOSIS — E039 Hypothyroidism, unspecified: Secondary | ICD-10-CM | POA: Diagnosis not present

## 2024-10-21 DIAGNOSIS — E119 Type 2 diabetes mellitus without complications: Secondary | ICD-10-CM | POA: Diagnosis not present

## 2024-10-21 DIAGNOSIS — M3500 Sicca syndrome, unspecified: Secondary | ICD-10-CM | POA: Diagnosis not present

## 2024-10-21 DIAGNOSIS — I493 Ventricular premature depolarization: Secondary | ICD-10-CM | POA: Insufficient documentation

## 2024-10-21 DIAGNOSIS — Z79890 Hormone replacement therapy (postmenopausal): Secondary | ICD-10-CM | POA: Diagnosis not present

## 2024-10-21 DIAGNOSIS — Z7985 Long-term (current) use of injectable non-insulin antidiabetic drugs: Secondary | ICD-10-CM | POA: Diagnosis not present

## 2024-10-21 HISTORY — DX: Other specified disorders of bone density and structure, unspecified site: M85.80

## 2024-10-21 HISTORY — DX: Acute eczematoid otitis externa, bilateral: H60.543

## 2024-10-21 HISTORY — DX: Tinnitus, unspecified ear: H93.19

## 2024-10-21 HISTORY — DX: Tachycardia, unspecified: R00.0

## 2024-10-21 LAB — CBC
HCT: 42.8 % (ref 36.0–46.0)
Hemoglobin: 13.9 g/dL (ref 12.0–15.0)
MCH: 32.3 pg (ref 26.0–34.0)
MCHC: 32.5 g/dL (ref 30.0–36.0)
MCV: 99.5 fL (ref 80.0–100.0)
Platelets: 203 K/uL (ref 150–400)
RBC: 4.3 MIL/uL (ref 3.87–5.11)
RDW: 13.9 % (ref 11.5–15.5)
WBC: 6.9 K/uL (ref 4.0–10.5)
nRBC: 0 % (ref 0.0–0.2)

## 2024-10-21 LAB — BASIC METABOLIC PANEL WITH GFR
Anion gap: 10 (ref 5–15)
BUN: 9 mg/dL (ref 8–23)
CO2: 28 mmol/L (ref 22–32)
Calcium: 10.4 mg/dL — ABNORMAL HIGH (ref 8.9–10.3)
Chloride: 101 mmol/L (ref 98–111)
Creatinine, Ser: 0.89 mg/dL (ref 0.44–1.00)
GFR, Estimated: >60 mL/min (ref >60)
Glucose, Bld: 105 mg/dL — ABNORMAL HIGH (ref 70–99)
Potassium: 3.7 mmol/L (ref 3.5–5.1)
Sodium: 139 mmol/L (ref 135–145)

## 2024-10-21 LAB — SURGICAL PCR SCREEN
MRSA, PCR: NEGATIVE
Staphylococcus aureus: POSITIVE — AB

## 2024-10-21 NOTE — H&P (Signed)
 TOTAL KNEE ADMISSION H&P  Patient is being admitted for left total knee arthroplasty.  Subjective:  Chief Complaint:left knee pain.  HPI: Kelly Thornton, 63 y.o. female, has a history of pain and functional disability in the left knee due to arthritis and has failed non-surgical conservative treatments for greater than 12 weeks to includeNSAID's and/or analgesics, corticosteriod injections, use of assistive devices, and activity modification.  Onset of symptoms was gradual, starting many years ago with gradually worsening course since that time. The patient noted no past surgery on the left knee(s).  Patient currently rates pain in the left knee(s) at 10 out of 10 with activity. Patient has night pain, worsening of pain with activity and weight bearing, pain that interferes with activities of daily living, and pain with passive range of motion.  Patient has evidence of periarticular osteophytes and joint space narrowing by imaging studies.  There is no active infection.  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   Acute recurrent pansinusitis 10/12/2018   Tongue mass 09/14/2018   DOE (dyspnea on exertion) 04/03/2018   Morbid obesity due to excess calories (HCC) 04/03/2018   Cough variant asthma  vs UACS/ VCD 04/02/2018   Type 2 diabetes mellitus, without long-term current use of insulin  (HCC) 12/23/2013   Dry eyes 08/12/2012   Conjunctivitis  07/08/2012   Nuclear cataract 07/08/2012   Chronic interstitial cystitis 05/13/2012   Other chronic cystitis 05/13/2012   Abdominal pain 05/13/2012   Dysuria 05/13/2012   Increased frequency of urination 05/13/2012   Irritable bowel syndrome 05/13/2012   Myalgia and myositis 05/13/2012   Urinary urgency 05/13/2012   Tinnitus of both ears 12/23/2008   Tachycardia 12/23/1993   Sjogren syndrome with glomerular disease 05/23/1982   Past Medical History:  Diagnosis Date   Abnormal uterine bleeding    Anxiety    Arthritis    Cataract    Chronic interstitial cystitis    Colitis, ischemic    Cough variant asthma 04/02/2018   FENO 04/02/2018  =   11 on advair 250 one bid - Spirometry 04/02/2018  FEV1 2.39 (84%)  Ratio 87 with truncation of peak flow p advair prior  - 04/02/2018  After extensive coaching inhaler device  effectiveness =    75% from a baseline of 50% so try symbicort  80  1-2 bid     Cushing disease (HCC)    Depression    Dermatitis of both ear canals    Diabetes mellitus without complication (HCC)    borderline   Dyspnea    Dysrhythmia    Endometriosis    Fibromyalgia    GERD (gastroesophageal reflux disease)    Gout    Hearing loss    Hyperlipidemia    Hypertension    Hypothyroidism    Irritable bowel syndrome (IBS)    Meniere disease    Bilateral   Morbid obesity (HCC)    Nonsustained ventricular tachycardia (HCC)    Osteopenia    Pneumonia    PONV (postoperative nausea  and vomiting)    PVC (premature ventricular contraction)    Seasonal allergies    Sjogren's disease    Tachycardia    Tinnitus    Wears hearing aid in both ears     Past Surgical History:  Procedure Laterality Date   BIOPSY  08/15/2020   Procedure: BIOPSY;  Surgeon: Dianna Specking, MD;  Location: [facility];  Service: Endoscopy;;   bladder stretching     x2   COLONOSCOPY WITH PROPOFOL  N/A 08/15/2020   Procedure: COLONOSCOPY WITH PROPOFOL ;  Surgeon: Dianna Specking, MD;  Location:  [facility];  Service: Endoscopy;  Laterality: N/A;   CYSTOSCOPY     DIAGNOSTIC LAPAROSCOPY     x4   DILATION AND CURETTAGE OF UTERUS     ENDOMETRIAL BIOPSY     EXCISION OF TONGUE LESION Left 10/02/2018   Procedure: EXCISION OF TONGUE LESION;  Surgeon: Mable Lenis, MD;  Location: [facility] OR;  Service: ENT;  Laterality: Left;   EYE SURGERY     stye removed   HAMMER TOE SURGERY Right 2023   MYELOGRAM  1985   NASAL SEPTUM SURGERY     papiloma  2014   under tongue   ROBOTIC ADRENALECTOMY Left 11/18/2023   Procedure: XI ROBOTIC LEFT ADRENALECTOMY;  Surgeon: Devere Lonni Righter, MD;  Location: [facility];  Service: Urology;  Laterality: Left;   STAPEDECTOMY     TONSILLECTOMY AND ADENOIDECTOMY     TUBAL LIGATION     WISDOM TOOTH EXTRACTION      Current Outpatient Medications  Medication Sig Dispense Refill Last Dose/Taking   albuterol  (PROVENTIL  HFA;VENTOLIN  HFA) 108 (90 BASE) MCG/ACT inhaler Inhale 2 puffs into the lungs every 6 (six) hours as needed for wheezing or shortness of breath.      albuterol  (PROVENTIL ) (2.5 MG/3ML) 0.083% nebulizer solution Take 2.5 mg by nebulization every 6 (six) hours as needed for wheezing or shortness of breath.      allopurinol  (ZYLOPRIM ) 300 MG tablet Take 300 mg by mouth at bedtime.      amitriptyline  (ELAVIL ) 100 MG tablet Take 100 mg by mouth at bedtime.      BIOTIN PO Take 2,500 mcg by mouth at bedtime.      budesonide -formoterol  (SYMBICORT ) 80-4.5 MCG/ACT inhaler Take 2 puffs first thing in am and then another 2 puffs about 12 hours later. (Patient taking differently: Inhale 1 puff into the lungs in the morning and at bedtime.) 1 each 12    calcium  carbonate (OSCAL) 1500 (600 Ca) MG TABS tablet Take 600 mg of elemental calcium  by mouth at bedtime.      carboxymethylcellulose (REFRESH PLUS) 0.5 % SOLN Place 1 drop into both eyes 3 (three) times daily as needed (dry eyes).      carvedilol  (COREG ) 12.5 MG tablet TAKE 1 TABLET BY MOUTH TWICE  DAILY 180  tablet 3    cetirizine (ZYRTEC) 10 MG tablet Take 10 mg by mouth 2 (two) times daily.      Cholecalciferol (VITAMIN D3 PO) Take 2,000 Units by mouth in the morning.      clotrimazole-betamethasone (LOTRISONE) cream Apply 1 Application topically 2 (two) times daily as needed (skin irritation.).      cromolyn (NASALCROM) 5.2 MG/ACT nasal spray Place 1 spray into both nostrils 2 (two) times daily as needed for allergies.      desvenlafaxine (PRISTIQ) 100 MG 24 hr tablet Take 100 mg by mouth in the morning.      docusate sodium  (COLACE) 100 MG capsule Take  100 mg by mouth 2 (two) times daily.      EPINEPHrine  0.3 mg/0.3 mL IJ SOAJ injection Inject 0.3 mg into the muscle as needed for anaphylaxis (from seafood).       Eyelid Cleansers (AVENOVA) 0.01 % SOLN Place 1 application into both eyes 2 (two) times daily.      fluocinonide (LIDEX) 0.05 % external solution Apply 1 Application topically daily.      fluticasone  (FLONASE) 50 MCG/ACT nasal spray Place 1 spray into both nostrils in the morning and at bedtime.      levothyroxine  (SYNTHROID ) 175 MCG tablet Take 0.125 mcg by mouth daily before breakfast.      magnesium oxide (MAG-OX) 400 (240 Mg) MG tablet Take 400 mg by mouth in the morning.      Menaquinone-7 (FT VITAMIN K2) 100 MCG CAPS Take 100 mcg by mouth at bedtime.      mexiletine (MEXITIL ) 150 MG capsule TAKE 1 CAPSULE BY MOUTH TWICE  DAILY 180 capsule 3    NASAL SALINE NA Place 2 sprays into both nostrils daily.      nystatin -triamcinolone  ointment (MYCOLOG) Apply 1 Application topically 2 (two) times daily. (Patient taking differently: Apply 1 Application topically 2 (two) times daily as needed (irritation.).) 60 g 2    OZEMPIC, 2 MG/DOSE, 8 MG/3ML SOPN Inject 2 mg into the skin every Sunday.      Pentosan Polysulfate Sodium 200 MG CPDR Take 200 mg by mouth 2 (two) times daily.      rosuvastatin  (CRESTOR ) 5 MG tablet Take 5 mg by mouth at bedtime.       Simethicone (GAS-X ULTRA STRENGTH PO)  Take 250 mg by mouth in the morning and at bedtime.      triamterene -hydrochlorothiazide  (MAXZIDE -25) 37.5-25 MG tablet Take 1 tablet by mouth in the morning.      No current facility-administered medications for this visit.   Allergies  Allergen Reactions   Isopropyl Alcohol Rash   Mineral Oil Rash   Monosodium Glutamate Nausea And Vomiting   Nickel Rash   Other Other (See Comments)    Other reaction(s): Other (See Comments) gastritis redness PNEUMO VAX, RUBBING ALCOHOL, MINERAL OIL, NICKEL, MSG, SILICON- REACTION  gastritis redness PNEUMO VAX, RUBBING ALCOHOL, MINERAL OIL, NICKEL, MSG, SILICON- REACTION  GI upset   Pneumovax [Pneumococcal Polysaccharide Vaccine] Rash   Procaine Shortness Of Breath and Rash   Rubbing Alcohol [Alcohol] Other (See Comments)    burns skin    Shellfish Allergy  Anaphylaxis   Silicone Dioxide [Silica] Rash and Other (See Comments)    Skin burning   Ace Inhibitors Swelling    Lips swell   Avelox [Moxifloxacin Hcl In Nacl] Other (See Comments)    Gastritis   Cephalosporins     Other reaction(s): Unknown   Chocolate Other (See Comments)    Sneezing   Clofexamide     Other reaction(s): Unknown   Cocoa Other (See Comments)    Sneezing   Codeine Nausea Only and Other (See Comments)    GI upset   Darvon [Propoxyphene] Other (See Comments)    GI Upset   Flavoring Agent (Non-Screening)     Other reaction(s): Unknown   Fluorescein-Benoxinate Other (See Comments)    Eye irritation   Hydrocodone-Acetaminophen  Other (See Comments)    GI upset *Vicodin    Lisinopril Swelling    Tongue and lips became swollen   Silicone     Other reaction(s): Unknown   Simvastatin Other (See Comments)  Leg cramps   Tape Other (See Comments)    Redness Other reaction(s): Unknown   Tetracyclines & Related Other (See Comments)    GI upset   Bextra [Valdecoxib] Rash   Biaxin [Clarithromycin] Rash   Ceclor [Cefaclor] Rash   Doxycycline Rash   Erythromycin  Rash   Hibiclens  [Chlorhexidine  Gluconate] Rash   Keflex [Cephalexin] Rash   Penicillins Rash    Did it involve swelling of the face/tongue/throat, SOB, or low BP? Yes Did it involve sudden or severe rash/hives, skin peeling, or any reaction on the inside of your mouth or nose? No Did you need to seek medical attention at a hospital or doctor's office? No When did it last happen? I was a kid   If all above answers are NO, may proceed with cephalosporin use.    Povidone-Iodine Rash        Thimerosal (Thiomersal) Rash    Social History   Tobacco Use   Smoking status: Never    Passive exposure: Never   Smokeless tobacco: Never  Substance Use Topics   Alcohol use: Not Currently    Family History  Problem Relation Age of Onset   Breast cancer Maternal Aunt    Breast cancer Maternal Grandmother    Cancer Other    Hyperlipidemia Other    Hypertension Other    Heart disease Other    Sleep apnea Other      Review of Systems  Musculoskeletal:  Positive for arthralgias.  All other systems reviewed and are negative.   Objective:  Physical Exam Constitutional:      General: She is not in acute distress.    Appearance: Normal appearance. She is not ill-appearing.  HENT:     Head: Normocephalic and atraumatic.     Right Ear: External ear normal.     Left Ear: External ear normal.     Nose: Nose normal.     Mouth/Throat:     Mouth: Mucous membranes are moist.     Pharynx: Oropharynx is clear.  Eyes:     Extraocular Movements: Extraocular movements intact.     Conjunctiva/sclera: Conjunctivae normal.  Cardiovascular:     Rate and Rhythm: Normal rate.     Pulses: Normal pulses.  Pulmonary:     Effort: Pulmonary effort is normal.  Abdominal:     General: Bowel sounds are normal.     Palpations: Abdomen is soft.  Musculoskeletal:        General: Tenderness present.     Cervical back: Normal range of motion and neck supple.     Comments: TTP over medial and lateral  joint line.  No calf tenderness, swelling, or erythema.  No overlying lesions of area of chief complaint.  Decreased strength and ROM due to elicited pain.   Dorsiflexion and plantarflexion intact.  Stable to varus and valgus stress.  BLE appear grossly neurovascularly intact.  Gait mildly antalgic.   Skin:    General: Skin is warm and dry.  Neurological:     Mental Status: She is alert and oriented to person, place, and time. Mental status is at baseline.  Psychiatric:        Mood and Affect: Mood normal.        Behavior: Behavior normal.     Vital signs in last 24 hours: @VSRANGES @  Labs:   Estimated body mass index is 38.79 kg/m as calculated from the following:   Height as of an earlier encounter on 10/21/24: 5' 6.5 (1.689 m).  Weight as of an earlier encounter on 10/21/24: 110.7 kg.   Imaging Review Plain radiographs demonstrate severe degenerative joint disease of the left knee(s). The overall alignment issignificant valgus. The bone quality appears to be fair for age and reported activity level.      Assessment/Plan:  End stage arthritis, left knee   The patient history, physical examination, clinical judgment of the provider and imaging studies are consistent with end stage degenerative joint disease of the left knee(s) and total knee arthroplasty is deemed medically necessary. The treatment options including medical management, injection therapy arthroscopy and arthroplasty were discussed at length. The risks and benefits of total knee arthroplasty were presented and reviewed. The risks due to aseptic loosening, infection, stiffness, patella tracking problems, thromboembolic complications and other imponderables were discussed. The patient acknowledged the explanation, agreed to proceed with the plan and consent was signed. Patient is being admitted for inpatient treatment for surgery, pain control, PT, OT, prophylactic antibiotics, VTE prophylaxis, progressive  ambulation and ADL's and discharge planning. The patient is planning to be discharged home with OPPT    Anticipated LOS equal to or greater than 2 midnights due to - Age 64 and older with one or more of the following:  - Obesity  - Expected need for hospital services (PT, OT, Nursing) required for safe  discharge  - Anticipated need for postoperative skilled nursing care or inpatient rehab  - Active co-morbidities: DMT1, HTN, HLD, CKD stage III, GERD, IBS, Sjogren's, PONV, Meniere's disease, chronic mesenteric ischemia, ischemic colitis, PVCs, fibromyalgia, endometriosis, Cushing's syndrome, chronic interstitial cystitis, abnromal uterine bleeding, DOE, hypothyroidism, gout, depression, asthma OR   - Unanticipated findings during/Post Surgery: None  - Patient is a high risk of re-admission due to: None

## 2024-10-21 NOTE — H&P (View-Only) (Signed)
 TOTAL KNEE ADMISSION H&P  Patient is being admitted for left total knee arthroplasty.  Subjective:  Chief Complaint:left knee pain.  HPI: Kelly Thornton, 63 y.o. female, has a history of pain and functional disability in the left knee due to arthritis and has failed non-surgical conservative treatments for greater than 12 weeks to includeNSAID's and/or analgesics, corticosteriod injections, use of assistive devices, and activity modification.  Onset of symptoms was gradual, starting many years ago with gradually worsening course since that time. The patient noted no past surgery on the left knee(s).  Patient currently rates pain in the left knee(s) at 10 out of 10 with activity. Patient has night pain, worsening of pain with activity and weight bearing, pain that interferes with activities of daily living, and pain with passive range of motion.  Patient has evidence of periarticular osteophytes and joint space narrowing by imaging studies.  There is no active infection.  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   Acute recurrent pansinusitis 10/12/2018   Tongue mass 09/14/2018   DOE (dyspnea on exertion) 04/03/2018   Morbid obesity due to excess calories (HCC) 04/03/2018   Cough variant asthma  vs UACS/ VCD 04/02/2018   Type 2 diabetes mellitus, without long-term current use of insulin  (HCC) 12/23/2013   Dry eyes 08/12/2012   Conjunctivitis  07/08/2012   Nuclear cataract 07/08/2012   Chronic interstitial cystitis 05/13/2012   Other chronic cystitis 05/13/2012   Abdominal pain 05/13/2012   Dysuria 05/13/2012   Increased frequency of urination 05/13/2012   Irritable bowel syndrome 05/13/2012   Myalgia and myositis 05/13/2012   Urinary urgency 05/13/2012   Tinnitus of both ears 12/23/2008   Tachycardia 12/23/1993   Sjogren syndrome with glomerular disease 05/23/1982   Past Medical History:  Diagnosis Date   Abnormal uterine bleeding    Anxiety    Arthritis    Cataract    Chronic interstitial cystitis    Colitis, ischemic    Cough variant asthma 04/02/2018   FENO 04/02/2018  =   11 on advair 250 one bid - Spirometry 04/02/2018  FEV1 2.39 (84%)  Ratio 87 with truncation of peak flow p advair prior  - 04/02/2018  After extensive coaching inhaler device  effectiveness =    75% from a baseline of 50% so try symbicort  80  1-2 bid     Cushing disease (HCC)    Depression    Dermatitis of both ear canals    Diabetes mellitus without complication (HCC)    borderline   Dyspnea    Dysrhythmia    Endometriosis    Fibromyalgia    GERD (gastroesophageal reflux disease)    Gout    Hearing loss    Hyperlipidemia    Hypertension    Hypothyroidism    Irritable bowel syndrome (IBS)    Meniere disease    Bilateral   Morbid obesity (HCC)    Nonsustained ventricular tachycardia (HCC)    Osteopenia    Pneumonia    PONV (postoperative nausea  and vomiting)    PVC (premature ventricular contraction)    Seasonal allergies    Sjogren's disease    Tachycardia    Tinnitus    Wears hearing aid in both ears     Past Surgical History:  Procedure Laterality Date   BIOPSY  08/15/2020   Procedure: BIOPSY;  Surgeon: Dianna Specking, MD;  Location: [facility];  Service: Endoscopy;;   bladder stretching     x2   COLONOSCOPY WITH PROPOFOL  N/A 08/15/2020   Procedure: COLONOSCOPY WITH PROPOFOL ;  Surgeon: Dianna Specking, MD;  Location:  [facility];  Service: Endoscopy;  Laterality: N/A;   CYSTOSCOPY     DIAGNOSTIC LAPAROSCOPY     x4   DILATION AND CURETTAGE OF UTERUS     ENDOMETRIAL BIOPSY     EXCISION OF TONGUE LESION Left 10/02/2018   Procedure: EXCISION OF TONGUE LESION;  Surgeon: Mable Lenis, MD;  Location: [facility] OR;  Service: ENT;  Laterality: Left;   EYE SURGERY     stye removed   HAMMER TOE SURGERY Right 2023   MYELOGRAM  1985   NASAL SEPTUM SURGERY     papiloma  2014   under tongue   ROBOTIC ADRENALECTOMY Left 11/18/2023   Procedure: XI ROBOTIC LEFT ADRENALECTOMY;  Surgeon: Devere Lonni Righter, MD;  Location: [facility];  Service: Urology;  Laterality: Left;   STAPEDECTOMY     TONSILLECTOMY AND ADENOIDECTOMY     TUBAL LIGATION     WISDOM TOOTH EXTRACTION      Current Outpatient Medications  Medication Sig Dispense Refill Last Dose/Taking   albuterol  (PROVENTIL  HFA;VENTOLIN  HFA) 108 (90 BASE) MCG/ACT inhaler Inhale 2 puffs into the lungs every 6 (six) hours as needed for wheezing or shortness of breath.      albuterol  (PROVENTIL ) (2.5 MG/3ML) 0.083% nebulizer solution Take 2.5 mg by nebulization every 6 (six) hours as needed for wheezing or shortness of breath.      allopurinol  (ZYLOPRIM ) 300 MG tablet Take 300 mg by mouth at bedtime.      amitriptyline  (ELAVIL ) 100 MG tablet Take 100 mg by mouth at bedtime.      BIOTIN PO Take 2,500 mcg by mouth at bedtime.      budesonide -formoterol  (SYMBICORT ) 80-4.5 MCG/ACT inhaler Take 2 puffs first thing in am and then another 2 puffs about 12 hours later. (Patient taking differently: Inhale 1 puff into the lungs in the morning and at bedtime.) 1 each 12    calcium  carbonate (OSCAL) 1500 (600 Ca) MG TABS tablet Take 600 mg of elemental calcium  by mouth at bedtime.      carboxymethylcellulose (REFRESH PLUS) 0.5 % SOLN Place 1 drop into both eyes 3 (three) times daily as needed (dry eyes).      carvedilol  (COREG ) 12.5 MG tablet TAKE 1 TABLET BY MOUTH TWICE  DAILY 180  tablet 3    cetirizine (ZYRTEC) 10 MG tablet Take 10 mg by mouth 2 (two) times daily.      Cholecalciferol (VITAMIN D3 PO) Take 2,000 Units by mouth in the morning.      clotrimazole-betamethasone (LOTRISONE) cream Apply 1 Application topically 2 (two) times daily as needed (skin irritation.).      cromolyn (NASALCROM) 5.2 MG/ACT nasal spray Place 1 spray into both nostrils 2 (two) times daily as needed for allergies.      desvenlafaxine (PRISTIQ) 100 MG 24 hr tablet Take 100 mg by mouth in the morning.      docusate sodium  (COLACE) 100 MG capsule Take  100 mg by mouth 2 (two) times daily.      EPINEPHrine  0.3 mg/0.3 mL IJ SOAJ injection Inject 0.3 mg into the muscle as needed for anaphylaxis (from seafood).       Eyelid Cleansers (AVENOVA) 0.01 % SOLN Place 1 application into both eyes 2 (two) times daily.      fluocinonide (LIDEX) 0.05 % external solution Apply 1 Application topically daily.      fluticasone  (FLONASE) 50 MCG/ACT nasal spray Place 1 spray into both nostrils in the morning and at bedtime.      levothyroxine  (SYNTHROID ) 175 MCG tablet Take 0.125 mcg by mouth daily before breakfast.      magnesium oxide (MAG-OX) 400 (240 Mg) MG tablet Take 400 mg by mouth in the morning.      Menaquinone-7 (FT VITAMIN K2) 100 MCG CAPS Take 100 mcg by mouth at bedtime.      mexiletine (MEXITIL ) 150 MG capsule TAKE 1 CAPSULE BY MOUTH TWICE  DAILY 180 capsule 3    NASAL SALINE NA Place 2 sprays into both nostrils daily.      nystatin -triamcinolone  ointment (MYCOLOG) Apply 1 Application topically 2 (two) times daily. (Patient taking differently: Apply 1 Application topically 2 (two) times daily as needed (irritation.).) 60 g 2    OZEMPIC, 2 MG/DOSE, 8 MG/3ML SOPN Inject 2 mg into the skin every Sunday.      Pentosan Polysulfate Sodium 200 MG CPDR Take 200 mg by mouth 2 (two) times daily.      rosuvastatin  (CRESTOR ) 5 MG tablet Take 5 mg by mouth at bedtime.       Simethicone (GAS-X ULTRA STRENGTH PO)  Take 250 mg by mouth in the morning and at bedtime.      triamterene -hydrochlorothiazide  (MAXZIDE -25) 37.5-25 MG tablet Take 1 tablet by mouth in the morning.      No current facility-administered medications for this visit.   Allergies  Allergen Reactions   Isopropyl Alcohol Rash   Mineral Oil Rash   Monosodium Glutamate Nausea And Vomiting   Nickel Rash   Other Other (See Comments)    Other reaction(s): Other (See Comments) gastritis redness PNEUMO VAX, RUBBING ALCOHOL, MINERAL OIL, NICKEL, MSG, SILICON- REACTION  gastritis redness PNEUMO VAX, RUBBING ALCOHOL, MINERAL OIL, NICKEL, MSG, SILICON- REACTION  GI upset   Pneumovax [Pneumococcal Polysaccharide Vaccine] Rash   Procaine Shortness Of Breath and Rash   Rubbing Alcohol [Alcohol] Other (See Comments)    burns skin    Shellfish Allergy  Anaphylaxis   Silicone Dioxide [Silica] Rash and Other (See Comments)    Skin burning   Ace Inhibitors Swelling    Lips swell   Avelox [Moxifloxacin Hcl In Nacl] Other (See Comments)    Gastritis   Cephalosporins     Other reaction(s): Unknown   Chocolate Other (See Comments)    Sneezing   Clofexamide     Other reaction(s): Unknown   Cocoa Other (See Comments)    Sneezing   Codeine Nausea Only and Other (See Comments)    GI upset   Darvon [Propoxyphene] Other (See Comments)    GI Upset   Flavoring Agent (Non-Screening)     Other reaction(s): Unknown   Fluorescein-Benoxinate Other (See Comments)    Eye irritation   Hydrocodone-Acetaminophen  Other (See Comments)    GI upset *Vicodin    Lisinopril Swelling    Tongue and lips became swollen   Silicone     Other reaction(s): Unknown   Simvastatin Other (See Comments)  Leg cramps   Tape Other (See Comments)    Redness Other reaction(s): Unknown   Tetracyclines & Related Other (See Comments)    GI upset   Bextra [Valdecoxib] Rash   Biaxin [Clarithromycin] Rash   Ceclor [Cefaclor] Rash   Doxycycline Rash   Erythromycin  Rash   Hibiclens  [Chlorhexidine  Gluconate] Rash   Keflex [Cephalexin] Rash   Penicillins Rash    Did it involve swelling of the face/tongue/throat, SOB, or low BP? Yes Did it involve sudden or severe rash/hives, skin peeling, or any reaction on the inside of your mouth or nose? No Did you need to seek medical attention at a hospital or doctor's office? No When did it last happen? I was a kid   If all above answers are NO, may proceed with cephalosporin use.    Povidone-Iodine Rash        Thimerosal (Thiomersal) Rash    Social History   Tobacco Use   Smoking status: Never    Passive exposure: Never   Smokeless tobacco: Never  Substance Use Topics   Alcohol use: Not Currently    Family History  Problem Relation Age of Onset   Breast cancer Maternal Aunt    Breast cancer Maternal Grandmother    Cancer Other    Hyperlipidemia Other    Hypertension Other    Heart disease Other    Sleep apnea Other      Review of Systems  Musculoskeletal:  Positive for arthralgias.  All other systems reviewed and are negative.   Objective:  Physical Exam Constitutional:      General: She is not in acute distress.    Appearance: Normal appearance. She is not ill-appearing.  HENT:     Head: Normocephalic and atraumatic.     Right Ear: External ear normal.     Left Ear: External ear normal.     Nose: Nose normal.     Mouth/Throat:     Mouth: Mucous membranes are moist.     Pharynx: Oropharynx is clear.  Eyes:     Extraocular Movements: Extraocular movements intact.     Conjunctiva/sclera: Conjunctivae normal.  Cardiovascular:     Rate and Rhythm: Normal rate.     Pulses: Normal pulses.  Pulmonary:     Effort: Pulmonary effort is normal.  Abdominal:     General: Bowel sounds are normal.     Palpations: Abdomen is soft.  Musculoskeletal:        General: Tenderness present.     Cervical back: Normal range of motion and neck supple.     Comments: TTP over medial and lateral  joint line.  No calf tenderness, swelling, or erythema.  No overlying lesions of area of chief complaint.  Decreased strength and ROM due to elicited pain.   Dorsiflexion and plantarflexion intact.  Stable to varus and valgus stress.  BLE appear grossly neurovascularly intact.  Gait mildly antalgic.   Skin:    General: Skin is warm and dry.  Neurological:     Mental Status: She is alert and oriented to person, place, and time. Mental status is at baseline.  Psychiatric:        Mood and Affect: Mood normal.        Behavior: Behavior normal.     Vital signs in last 24 hours: @VSRANGES @  Labs:   Estimated body mass index is 38.79 kg/m as calculated from the following:   Height as of an earlier encounter on 10/21/24: 5' 6.5 (1.689 m).  Weight as of an earlier encounter on 10/21/24: 110.7 kg.   Imaging Review Plain radiographs demonstrate severe degenerative joint disease of the left knee(s). The overall alignment issignificant valgus. The bone quality appears to be fair for age and reported activity level.      Assessment/Plan:  End stage arthritis, left knee   The patient history, physical examination, clinical judgment of the provider and imaging studies are consistent with end stage degenerative joint disease of the left knee(s) and total knee arthroplasty is deemed medically necessary. The treatment options including medical management, injection therapy arthroscopy and arthroplasty were discussed at length. The risks and benefits of total knee arthroplasty were presented and reviewed. The risks due to aseptic loosening, infection, stiffness, patella tracking problems, thromboembolic complications and other imponderables were discussed. The patient acknowledged the explanation, agreed to proceed with the plan and consent was signed. Patient is being admitted for inpatient treatment for surgery, pain control, PT, OT, prophylactic antibiotics, VTE prophylaxis, progressive  ambulation and ADL's and discharge planning. The patient is planning to be discharged home with OPPT    Anticipated LOS equal to or greater than 2 midnights due to - Age 64 and older with one or more of the following:  - Obesity  - Expected need for hospital services (PT, OT, Nursing) required for safe  discharge  - Anticipated need for postoperative skilled nursing care or inpatient rehab  - Active co-morbidities: DMT1, HTN, HLD, CKD stage III, GERD, IBS, Sjogren's, PONV, Meniere's disease, chronic mesenteric ischemia, ischemic colitis, PVCs, fibromyalgia, endometriosis, Cushing's syndrome, chronic interstitial cystitis, abnromal uterine bleeding, DOE, hypothyroidism, gout, depression, asthma OR   - Unanticipated findings during/Post Surgery: None  - Patient is a high risk of re-admission due to: None

## 2024-10-22 NOTE — Progress Notes (Signed)
 Anesthesia Chart Review   Case: [id number] Date/Time: 11/01/24 0715   Procedure: ARTHROPLASTY, KNEE, TOTAL (Left: Knee)   Anesthesia type: Spinal   Pre-op diagnosis: OA LEFT KNEE   [facility] 08 / [facility]   Surgeons: Edna Toribio LABOR, MD       DISCUSSION:63 y.o. never smoker with h/o PONV, HTN, PVCs, hypothyroidism,  Sjogren's disease, DM II, left knee OA scheduled for above procedure 11/01/2024 with Dr. Toribio Edna.   Per preoperative evaluation 08/25/2024, Chart reviewed as part of pre-operative protocol coverage. Given past medical history and time since last visit, based on ACC/AHA guidelines, Kelly Thornton would be at acceptable risk for the planned procedure without further cardiovascular testing.  Her RCRI is very low risk, 0.4% risk of major cardiac event.  She is able to complete greater than 4 METS of physical activity.  Pt advised to hold Ozempic 1 week prior to surgery.   VS: BP 139/78   Pulse 87   Temp 36.7 C (Oral)   Resp 16   Ht 5' 6.5 (1.689 m)   Wt 110.7 kg   SpO2 100%   BMI 38.79 kg/m   PROVIDERS: Koirala, Dibas, MD is PCP    LABS: Labs reviewed: Acceptable for surgery. (all labs ordered are listed, but only abnormal results are displayed)  Labs Reviewed  SURGICAL PCR SCREEN - Abnormal; Notable for the following components:      Result Value   Staphylococcus aureus POSITIVE (*)    All other components within normal limits  BASIC METABOLIC PANEL WITH GFR - Abnormal; Notable for the following components:   Glucose, Bld 105 (*)    Calcium  10.4 (*)    All other components within normal limits  CBC     IMAGES:   EKG:   CV: Echo 01/16/2018  - Left ventricle: The cavity size was normal. Wall thickness was    increased in a pattern of mild LVH. Systolic function was normal.    The estimated ejection fraction was in the range of 55% to 60%.    The study is not technically sufficient to allow evaluation of LV    diastolic  function.  Past Medical History:  Diagnosis Date   Abnormal uterine bleeding    Anxiety    Arthritis    Cataract    Chronic interstitial cystitis    Colitis, ischemic    Cough variant asthma 04/02/2018   FENO 04/02/2018  =   11 on advair 250 one bid - Spirometry 04/02/2018  FEV1 2.39 (84%)  Ratio 87 with truncation of peak flow p advair prior  - 04/02/2018  After extensive coaching inhaler device  effectiveness =    75% from a baseline of 50% so try symbicort  80  1-2 bid     Cushing disease (HCC)    Depression    Dermatitis of both ear canals    Diabetes mellitus without complication (HCC)    borderline   Dyspnea    Dysrhythmia    Endometriosis    Fibromyalgia    GERD (gastroesophageal reflux disease)    Gout    Hearing loss    Hyperlipidemia    Hypertension    Hypothyroidism    Irritable bowel syndrome (IBS)    Meniere disease    Bilateral   Morbid obesity (HCC)    Nonsustained ventricular tachycardia (HCC)    Osteopenia    Pneumonia    PONV (postoperative nausea and vomiting)    PVC (premature ventricular contraction)  Seasonal allergies    Sjogren's disease    Tachycardia    Tinnitus    Wears hearing aid in both ears     Past Surgical History:  Procedure Laterality Date   BIOPSY  08/15/2020   Procedure: BIOPSY;  Surgeon: Dianna Specking, MD;  Location: [facility];  Service: Endoscopy;;   bladder stretching     x2   COLONOSCOPY WITH PROPOFOL  N/A 08/15/2020   Procedure: COLONOSCOPY WITH PROPOFOL ;  Surgeon: Dianna Specking, MD;  Location[facility];  Service: Endoscopy;  Laterality: N/A;   CYSTOSCOPY     DIAGNOSTIC LAPAROSCOPY     x4   DILATION AND CURETTAGE OF UTERUS     ENDOMETRIAL BIOPSY     EXCISION OF TONGUE LESION Left 10/02/2018   Procedure: EXCISION OF TONGUE LESION;  Surgeon: Mable Lenis, MD;  Location: [facility] OR;  Service: ENT;  Laterality: Left;   EYE SURGERY     stye removed   HAMMER TOE SURGERY Right 2023   MYELOGRAM  1985   NASAL  SEPTUM SURGERY     papiloma  2014   under tongue   ROBOTIC ADRENALECTOMY Left 11/18/2023   Procedure: XI ROBOTIC LEFT ADRENALECTOMY;  Surgeon: Devere Lonni Righter, MD;  Location: [facility];  Service: Urology;  Laterality: Left;   STAPEDECTOMY     TONSILLECTOMY AND ADENOIDECTOMY     TUBAL LIGATION     WISDOM TOOTH EXTRACTION      MEDICATIONS:  carboxymethylcellulose (REFRESH PLUS) 0.5 % SOLN   albuterol  (PROVENTIL  HFA;VENTOLIN  HFA) 108 (90 BASE) MCG/ACT inhaler   albuterol  (PROVENTIL ) (2.5 MG/3ML) 0.083% nebulizer solution   allopurinol  (ZYLOPRIM ) 300 MG tablet   amitriptyline  (ELAVIL ) 100 MG tablet   BIOTIN PO   budesonide -formoterol  (SYMBICORT ) 80-4.5 MCG/ACT inhaler   calcium  carbonate (OSCAL) 1500 (600 Ca) MG TABS tablet   carvedilol  (COREG ) 12.5 MG tablet   cetirizine (ZYRTEC) 10 MG tablet   Cholecalciferol (VITAMIN D3 PO)   clotrimazole-betamethasone (LOTRISONE) cream   cromolyn (NASALCROM) 5.2 MG/ACT nasal spray   desvenlafaxine (PRISTIQ) 100 MG 24 hr tablet   docusate sodium  (COLACE) 100 MG capsule   EPINEPHrine  0.3 mg/0.3 mL IJ SOAJ injection   Eyelid Cleansers (AVENOVA) 0.01 % SOLN   fluocinonide (LIDEX) 0.05 % external solution   fluticasone  (FLONASE) 50 MCG/ACT nasal spray   levothyroxine  (SYNTHROID ) 175 MCG tablet   magnesium oxide (MAG-OX) 400 (240 Mg) MG tablet   Menaquinone-7 (FT VITAMIN K2) 100 MCG CAPS   mexiletine (MEXITIL ) 150 MG capsule   NASAL SALINE NA   nystatin -triamcinolone  ointment (MYCOLOG)   OZEMPIC, 2 MG/DOSE, 8 MG/3ML SOPN   Pentosan Polysulfate Sodium 200 MG CPDR   rosuvastatin  (CRESTOR ) 5 MG tablet   Simethicone (GAS-X ULTRA STRENGTH PO)   triamterene -hydrochlorothiazide  (MAXZIDE -25) 37.5-25 MG tablet   No current facility-administered medications for this encounter.   Kelly Hoots Ward, PA-C [facility] [phone]

## 2024-10-30 NOTE — Anesthesia Preprocedure Evaluation (Signed)
 Anesthesia Evaluation  Patient identified by MRN, date of birth, ID band Patient awake    Reviewed: Allergy  & Precautions, NPO status , Patient's Chart, lab work & pertinent test results  History of Anesthesia Complications (+) PONV and history of anesthetic complications  Airway Mallampati: II  TM Distance: >3 FB Neck ROM: Full    Dental no notable dental hx. (+) Loose, Teeth Intact,    Pulmonary shortness of breath, asthma    Pulmonary exam normal breath sounds clear to auscultation       Cardiovascular hypertension, Pt. on medications + DOE  Normal cardiovascular exam+ dysrhythmias  Rhythm:Regular Rate:Normal     Neuro/Psych  PSYCHIATRIC DISORDERS Anxiety Depression       GI/Hepatic ,GERD  ,,  Endo/Other  diabetes, Type 2Hypothyroidism    Renal/GU Lab Results      Component                Value               Date                        K                        3.7                 10/21/2024                CO2                      28                  10/21/2024                BUN                      9                   10/21/2024                CREATININE               0.89                10/21/2024                GFRNONAA                 >60                 10/21/2024                     Musculoskeletal  (+) Arthritis ,  Fibromyalgia -  Abdominal  (+) + obese (BMI 38.8)  Peds  Hematology Lab Results      Component                Value               Date                      WBC                      6.9                 10/21/2024  HGB                      13.9                10/21/2024                HCT                      42.8                10/21/2024                MCV                      99.5                10/21/2024                PLT                      203                 10/21/2024              Anesthesia Other Findings All: see list  Reproductive/Obstetrics                               Anesthesia Physical Anesthesia Plan  ASA: 3  Anesthesia Plan: Spinal and Regional   Post-op Pain Management: Regional block*, Minimal or no pain anticipated and Ofirmev  IV (intra-op)*   Induction:   PONV Risk Score and Plan: Treatment may vary due to age or medical condition, Propofol  infusion, Midazolam  and Ondansetron   Airway Management Planned: Natural Airway and Nasal Cannula  Additional Equipment: None  Intra-op Plan:   Post-operative Plan:   Informed Consent: I have reviewed the patients History and Physical, chart, labs and discussed the procedure including the risks, benefits and alternatives for the proposed anesthesia with the patient or authorized representative who has indicated his/her understanding and acceptance.     Dental advisory given  Plan Discussed with: CRNA and Surgeon  Anesthesia Plan Comments: (Spinal  w L adductor)         Anesthesia Quick Evaluation

## 2024-11-01 ENCOUNTER — Ambulatory Visit (HOSPITAL_COMMUNITY): Payer: Self-pay | Admitting: Physician Assistant

## 2024-11-01 ENCOUNTER — Inpatient Hospital Stay (HOSPITAL_COMMUNITY): Payer: Self-pay | Admitting: Anesthesiology

## 2024-11-01 ENCOUNTER — Encounter (HOSPITAL_COMMUNITY): Payer: Self-pay | Admitting: Orthopedic Surgery

## 2024-11-01 ENCOUNTER — Other Ambulatory Visit: Payer: Self-pay

## 2024-11-01 ENCOUNTER — Inpatient Hospital Stay (HOSPITAL_COMMUNITY)
Admission: AD | Admit: 2024-11-01 | Discharge: 2024-11-07 | DRG: 481 | Disposition: A | Discharge status: Home / Self Care | Attending: Orthopedic Surgery | Admitting: Orthopedic Surgery

## 2024-11-01 ENCOUNTER — Encounter (HOSPITAL_COMMUNITY)
Admission: AD | Disposition: A | Discharge status: Home / Self Care | Payer: Self-pay | Source: Home / Self Care | Attending: Orthopedic Surgery

## 2024-11-01 ENCOUNTER — Observation Stay (HOSPITAL_COMMUNITY)

## 2024-11-01 DIAGNOSIS — E119 Type 2 diabetes mellitus without complications: Secondary | ICD-10-CM | POA: Diagnosis not present

## 2024-11-01 DIAGNOSIS — Z91018 Allergy to other foods: Secondary | ICD-10-CM

## 2024-11-01 DIAGNOSIS — E871 Hypo-osmolality and hyponatremia: Secondary | ICD-10-CM | POA: Diagnosis present

## 2024-11-01 DIAGNOSIS — Z888 Allergy status to other drugs, medicaments and biological substances status: Secondary | ICD-10-CM

## 2024-11-01 DIAGNOSIS — E876 Hypokalemia: Secondary | ICD-10-CM | POA: Diagnosis present

## 2024-11-01 DIAGNOSIS — Z79899 Other long term (current) drug therapy: Secondary | ICD-10-CM

## 2024-11-01 DIAGNOSIS — M1712 Unilateral primary osteoarthritis, left knee: Principal | ICD-10-CM | POA: Diagnosis present

## 2024-11-01 DIAGNOSIS — I951 Orthostatic hypotension: Secondary | ICD-10-CM | POA: Diagnosis present

## 2024-11-01 DIAGNOSIS — H9190 Unspecified hearing loss, unspecified ear: Secondary | ICD-10-CM | POA: Diagnosis present

## 2024-11-01 DIAGNOSIS — F32A Depression, unspecified: Secondary | ICD-10-CM | POA: Diagnosis present

## 2024-11-01 DIAGNOSIS — M1711 Unilateral primary osteoarthritis, right knee: Secondary | ICD-10-CM | POA: Diagnosis present

## 2024-11-01 DIAGNOSIS — E039 Hypothyroidism, unspecified: Secondary | ICD-10-CM | POA: Diagnosis present

## 2024-11-01 DIAGNOSIS — Z91013 Allergy to seafood: Secondary | ICD-10-CM

## 2024-11-01 DIAGNOSIS — I10 Essential (primary) hypertension: Secondary | ICD-10-CM | POA: Diagnosis not present

## 2024-11-01 DIAGNOSIS — Z886 Allergy status to analgesic agent status: Secondary | ICD-10-CM

## 2024-11-01 DIAGNOSIS — Z880 Allergy status to penicillin: Secondary | ICD-10-CM

## 2024-11-01 DIAGNOSIS — Z883 Allergy status to other anti-infective agents status: Secondary | ICD-10-CM

## 2024-11-01 DIAGNOSIS — Z91048 Other nonmedicinal substance allergy status: Secondary | ICD-10-CM

## 2024-11-01 DIAGNOSIS — Z887 Allergy status to serum and vaccine status: Secondary | ICD-10-CM

## 2024-11-01 DIAGNOSIS — K219 Gastro-esophageal reflux disease without esophagitis: Secondary | ICD-10-CM | POA: Diagnosis present

## 2024-11-01 DIAGNOSIS — Z884 Allergy status to anesthetic agent status: Secondary | ICD-10-CM

## 2024-11-01 DIAGNOSIS — J45909 Unspecified asthma, uncomplicated: Secondary | ICD-10-CM | POA: Diagnosis present

## 2024-11-01 DIAGNOSIS — Z803 Family history of malignant neoplasm of breast: Secondary | ICD-10-CM

## 2024-11-01 DIAGNOSIS — Z885 Allergy status to narcotic agent status: Secondary | ICD-10-CM

## 2024-11-01 DIAGNOSIS — Z881 Allergy status to other antibiotic agents status: Secondary | ICD-10-CM

## 2024-11-01 DIAGNOSIS — Z8249 Family history of ischemic heart disease and other diseases of the circulatory system: Secondary | ICD-10-CM

## 2024-11-01 DIAGNOSIS — Z79890 Hormone replacement therapy (postmenopausal): Secondary | ICD-10-CM

## 2024-11-01 DIAGNOSIS — G8929 Other chronic pain: Secondary | ICD-10-CM

## 2024-11-01 DIAGNOSIS — Z7951 Long term (current) use of inhaled steroids: Secondary | ICD-10-CM

## 2024-11-01 DIAGNOSIS — N3010 Interstitial cystitis (chronic) without hematuria: Secondary | ICD-10-CM | POA: Diagnosis present

## 2024-11-01 DIAGNOSIS — E785 Hyperlipidemia, unspecified: Secondary | ICD-10-CM | POA: Diagnosis present

## 2024-11-01 DIAGNOSIS — M170 Bilateral primary osteoarthritis of knee: Principal | ICD-10-CM | POA: Diagnosis present

## 2024-11-01 DIAGNOSIS — R000 Tachycardia, unspecified: Secondary | ICD-10-CM | POA: Diagnosis present

## 2024-11-01 DIAGNOSIS — Z974 Presence of external hearing-aid: Secondary | ICD-10-CM

## 2024-11-01 HISTORY — PX: TOTAL KNEE ARTHROPLASTY: SHX125

## 2024-11-01 LAB — CBC WITH DIFFERENTIAL/PLATELET
Abs Immature Granulocytes: 0.01 K/uL (ref 0.00–0.07)
Basophils Absolute: 0 K/uL (ref 0.0–0.1)
Basophils Relative: 1 %
Eosinophils Absolute: 0 K/uL (ref 0.0–0.5)
Eosinophils Relative: 0 %
HCT: 42.5 % (ref 36.0–46.0)
Hemoglobin: 13.5 g/dL (ref 12.0–15.0)
Immature Granulocytes: 0 %
Lymphocytes Relative: 28 %
Lymphs Abs: 1.8 K/uL (ref 0.7–4.0)
MCH: 31.3 pg (ref 26.0–34.0)
MCHC: 31.8 g/dL (ref 30.0–36.0)
MCV: 98.6 fL (ref 80.0–100.0)
Monocytes Absolute: 0.6 K/uL (ref 0.1–1.0)
Monocytes Relative: 10 %
Neutro Abs: 4 K/uL (ref 1.7–7.7)
Neutrophils Relative %: 61 %
Platelets: 210 K/uL (ref 150–400)
RBC: 4.31 MIL/uL (ref 3.87–5.11)
RDW: 13.7 % (ref 11.5–15.5)
WBC: 6.5 K/uL (ref 4.0–10.5)
nRBC: 0 % (ref 0.0–0.2)

## 2024-11-01 LAB — COMPREHENSIVE METABOLIC PANEL WITH GFR
ALT: 16 U/L (ref 0–44)
AST: 21 U/L (ref 15–41)
Albumin: 4.1 g/dL (ref 3.5–5.0)
Alkaline Phosphatase: 105 U/L (ref 38–126)
Anion gap: 12 (ref 5–15)
BUN: 13 mg/dL (ref 8–23)
CO2: 24 mmol/L (ref 22–32)
Calcium: 9.8 mg/dL (ref 8.9–10.3)
Chloride: 103 mmol/L (ref 98–111)
Creatinine, Ser: 0.9 mg/dL (ref 0.44–1.00)
GFR, Estimated: >60 mL/min (ref >60)
Glucose, Bld: 99 mg/dL (ref 70–99)
Potassium: 3.3 mmol/L — ABNORMAL LOW (ref 3.5–5.1)
Sodium: 138 mmol/L (ref 135–145)
Total Bilirubin: 0.6 mg/dL (ref 0.0–1.2)
Total Protein: 6.4 g/dL — ABNORMAL LOW (ref 6.5–8.1)

## 2024-11-01 LAB — TYPE AND SCREEN
ABO/RH(D): A NEG
Antibody Screen: NEGATIVE

## 2024-11-01 LAB — GLUCOSE, CAPILLARY
Glucose-Capillary: 98 mg/dL (ref 70–99)
Glucose-Capillary: 84 mg/dL (ref 70–99)

## 2024-11-01 SURGERY — ARTHROPLASTY, KNEE, TOTAL
Anesthesia: Regional | Site: Knee | Laterality: Left

## 2024-11-01 MED ORDER — SIMETHICONE 180 MG PO CAPS: 250.0000 mg | ORAL_CAPSULE | Freq: Two times a day (BID) | ORAL | Status: DC | PRN

## 2024-11-01 MED ORDER — VANCOMYCIN HCL IN DEXTROSE 1-5 GM/200ML-% IV SOLN
1000.0000 mg | Freq: Two times a day (BID) | INTRAVENOUS | Status: AC
  Administered 2024-11-01: 1000 mg via INTRAVENOUS
  Filled 2024-11-01: qty 200

## 2024-11-01 MED ORDER — SODIUM CHLORIDE (PF) 0.9 % IJ SOLN
INTRAMUSCULAR | Status: AC
  Filled 2024-11-01: qty 30

## 2024-11-01 MED ORDER — ACETAMINOPHEN 10 MG/ML IV SOLN
1000.0000 mg | Freq: Once | INTRAVENOUS | Status: AC | PRN
  Administered 2024-11-01: 1000 mg via INTRAVENOUS

## 2024-11-01 MED ORDER — AMITRIPTYLINE HCL 50 MG PO TABS
100.0000 mg | ORAL_TABLET | Freq: Every day | ORAL | Status: DC
  Administered 2024-11-01: 100 mg via ORAL
  Filled 2024-11-01: qty 2

## 2024-11-01 MED ORDER — LEVOTHYROXINE SODIUM 125 MCG PO TABS
125.0000 ug | ORAL_TABLET | Freq: Every day | ORAL | Status: DC
  Administered 2024-11-02 – 2024-11-07 (×6): 125 ug via ORAL
  Filled 2024-11-01 (×6): qty 1

## 2024-11-01 MED ORDER — POLYETHYLENE GLYCOL 3350 17 G PO PACK
17.0000 g | PACK | Freq: Every day | ORAL | Status: DC | PRN
  Filled 2024-11-01: qty 1

## 2024-11-01 MED ORDER — ALBUTEROL SULFATE (2.5 MG/3ML) 0.083% IN NEBU: 2.5000 mg | INHALATION_SOLUTION | Freq: Four times a day (QID) | RESPIRATORY_TRACT | Status: DC | PRN

## 2024-11-01 MED ORDER — SCOPOLAMINE 1 MG/3DAYS TD PT72
1.0000 | MEDICATED_PATCH | TRANSDERMAL | Status: DC
  Administered 2024-11-01: 1 mg via TRANSDERMAL

## 2024-11-01 MED ORDER — ALBUTEROL SULFATE HFA 108 (90 BASE) MCG/ACT IN AERS: 2.0000 | INHALATION_SPRAY | Freq: Four times a day (QID) | RESPIRATORY_TRACT | Status: DC | PRN

## 2024-11-01 MED ORDER — POLYETHYLENE GLYCOL 3350 17 G PO PACK: 17.0000 g | PACK | Freq: Every day | ORAL | 0 refills | Status: DC

## 2024-11-01 MED ORDER — ONDANSETRON HCL 4 MG/2ML IJ SOLN
INTRAMUSCULAR | Status: DC | PRN
  Administered 2024-11-01: 4 mg via INTRAVENOUS

## 2024-11-01 MED ORDER — FENTANYL CITRATE (PF) 50 MCG/ML IJ SOSY
PREFILLED_SYRINGE | INTRAMUSCULAR | Status: AC
  Filled 2024-11-01: qty 1

## 2024-11-01 MED ORDER — FENTANYL CITRATE (PF) 100 MCG/2ML IJ SOLN
INTRAMUSCULAR | Status: AC
  Filled 2024-11-01: qty 2

## 2024-11-01 MED ORDER — BUPIVACAINE-EPINEPHRINE 0.25% -1:200000 IJ SOLN
INTRAMUSCULAR | Status: DC | PRN
  Administered 2024-11-01: 30 mL

## 2024-11-01 MED ORDER — LORATADINE 10 MG PO TABS
10.0000 mg | ORAL_TABLET | Freq: Every day | ORAL | Status: DC
  Administered 2024-11-02 – 2024-11-07 (×6): 10 mg via ORAL
  Filled 2024-11-01 (×6): qty 1

## 2024-11-01 MED ORDER — BUPIVACAINE LIPOSOME 1.3 % IJ SUSP
INTRAMUSCULAR | Status: AC
  Filled 2024-11-01: qty 20

## 2024-11-01 MED ORDER — ROPIVACAINE HCL 5 MG/ML IJ SOLN
INTRAMUSCULAR | Status: DC | PRN
  Administered 2024-11-01: 25 mL via PERINEURAL

## 2024-11-01 MED ORDER — CROMOLYN SODIUM 5.2 MG/ACT NA AERS: 1.0000 | INHALATION_SPRAY | Freq: Two times a day (BID) | NASAL | Status: DC | PRN

## 2024-11-01 MED ORDER — SCOPOLAMINE 1 MG/3DAYS TD PT72
MEDICATED_PATCH | TRANSDERMAL | Status: AC
  Filled 2024-11-01: qty 1

## 2024-11-01 MED ORDER — ASPIRIN 81 MG PO TBEC: 81.0000 mg | DELAYED_RELEASE_TABLET | Freq: Two times a day (BID) | ORAL | Status: AC

## 2024-11-01 MED ORDER — CLONIDINE HCL (ANALGESIA) 100 MCG/ML EP SOLN
EPIDURAL | Status: DC | PRN
  Administered 2024-11-01: 100 ug

## 2024-11-01 MED ORDER — TRIAMTERENE-HCTZ 37.5-25 MG PO TABS
1.0000 | ORAL_TABLET | Freq: Every day | ORAL | Status: DC
  Administered 2024-11-02 – 2024-11-04 (×3): 1 via ORAL
  Filled 2024-11-01 (×3): qty 1

## 2024-11-01 MED ORDER — SODIUM CHLORIDE 0.9% FLUSH
INTRAVENOUS | Status: DC | PRN
  Administered 2024-11-01: 30 mL

## 2024-11-01 MED ORDER — METHOCARBAMOL 500 MG PO TABS: 500.0000 mg | ORAL_TABLET | Freq: Three times a day (TID) | ORAL | 0 refills | Status: AC | PRN

## 2024-11-01 MED ORDER — FLUTICASONE PROPIONATE 50 MCG/ACT NA SUSP
1.0000 | Freq: Every day | NASAL | Status: DC
  Administered 2024-11-02 – 2024-11-07 (×6): 1 via NASAL
  Filled 2024-11-01: qty 16

## 2024-11-01 MED ORDER — KETOROLAC TROMETHAMINE 15 MG/ML IJ SOLN
7.5000 mg | Freq: Four times a day (QID) | INTRAMUSCULAR | Status: AC
  Administered 2024-11-01 – 2024-11-02 (×4): 7.5 mg via INTRAVENOUS
  Filled 2024-11-01 (×4): qty 1

## 2024-11-01 MED ORDER — CLOTRIMAZOLE-BETAMETHASONE 1-0.05 % EX CREA: 1.0000 | TOPICAL_CREAM | Freq: Two times a day (BID) | CUTANEOUS | Status: DC | PRN

## 2024-11-01 MED ORDER — CIPROFLOXACIN IN D5W 400 MG/200ML IV SOLN
400.0000 mg | Freq: Once | INTRAVENOUS | Status: AC
  Administered 2024-11-01: 400 mg via INTRAVENOUS
  Filled 2024-11-01: qty 200

## 2024-11-01 MED ORDER — CHLORHEXIDINE GLUCONATE 0.12 % MT SOLN: 15.0000 mL | Freq: Once | OROMUCOSAL | Status: DC

## 2024-11-01 MED ORDER — OXYCODONE HCL 5 MG PO TABS: 5.0000 mg | ORAL_TABLET | Freq: Once | ORAL | Status: DC | PRN

## 2024-11-01 MED ORDER — SIMETHICONE 80 MG PO CHEW
160.0000 mg | CHEWABLE_TABLET | Freq: Two times a day (BID) | ORAL | Status: DC | PRN
  Administered 2024-11-01 – 2024-11-06 (×5): 160 mg via ORAL
  Filled 2024-11-01 (×5): qty 2

## 2024-11-01 MED ORDER — LIDOCAINE 2% (20 MG/ML) 5 ML SYRINGE
INTRAMUSCULAR | Status: DC | PRN
  Administered 2024-11-01: 40 mg via INTRAVENOUS

## 2024-11-01 MED ORDER — INSULIN ASPART 100 UNIT/ML IJ SOLN: 0.0000 [IU] | INTRAMUSCULAR | Status: DC | PRN

## 2024-11-01 MED ORDER — CARVEDILOL 12.5 MG PO TABS
12.5000 mg | ORAL_TABLET | Freq: Two times a day (BID) | ORAL | Status: DC
  Administered 2024-11-01 – 2024-11-04 (×5): 12.5 mg via ORAL
  Filled 2024-11-01 (×6): qty 1

## 2024-11-01 MED ORDER — BUPIVACAINE LIPOSOME 1.3 % IJ SUSP
20.0000 mL | Freq: Once | INTRAMUSCULAR | Status: AC
Start: 2024-11-01 — End: 2024-11-01

## 2024-11-01 MED ORDER — MELOXICAM 15 MG PO TABS: 15.0000 mg | ORAL_TABLET | Freq: Every day | ORAL | 0 refills | Status: AC

## 2024-11-01 MED ORDER — ACETAMINOPHEN 10 MG/ML IV SOLN: 1000.0000 mg | Freq: Once | INTRAVENOUS | Status: DC | PRN

## 2024-11-01 MED ORDER — PHENOL 1.4 % MT LIQD
1.0000 | OROMUCOSAL | Status: DC | PRN
  Administered 2024-11-06: 1 via OROMUCOSAL
  Filled 2024-11-01: qty 177

## 2024-11-01 MED ORDER — MUPIROCIN 2 % EX OINT: 1.0000 | TOPICAL_OINTMENT | Freq: Two times a day (BID) | CUTANEOUS | 0 refills | Status: AC

## 2024-11-01 MED ORDER — FENTANYL CITRATE (PF) 50 MCG/ML IJ SOSY
25.0000 ug | PREFILLED_SYRINGE | INTRAMUSCULAR | Status: DC | PRN
  Administered 2024-11-01 (×3): 50 ug via INTRAVENOUS

## 2024-11-01 MED ORDER — PROPOFOL 10 MG/ML IV BOLUS
INTRAVENOUS | Status: AC
  Filled 2024-11-01: qty 20

## 2024-11-01 MED ORDER — ONDANSETRON HCL 4 MG/2ML IJ SOLN: 4.0000 mg | Freq: Once | INTRAMUSCULAR | Status: DC | PRN

## 2024-11-01 MED ORDER — LACTATED RINGERS IV SOLN: INTRAVENOUS | Status: DC

## 2024-11-01 MED ORDER — ZOLPIDEM TARTRATE 5 MG PO TABS
5.0000 mg | ORAL_TABLET | Freq: Every evening | ORAL | Status: DC | PRN
  Filled 2024-11-01: qty 1

## 2024-11-01 MED ORDER — SODIUM CHLORIDE 0.9 % IR SOLN
Status: DC | PRN
  Administered 2024-11-01: 3000 mL

## 2024-11-01 MED ORDER — ALLOPURINOL 300 MG PO TABS
300.0000 mg | ORAL_TABLET | Freq: Every day | ORAL | Status: DC
  Administered 2024-11-02 – 2024-11-06 (×5): 300 mg via ORAL
  Filled 2024-11-01 (×5): qty 1

## 2024-11-01 MED ORDER — ONDANSETRON HCL 4 MG PO TABS: 4.0000 mg | ORAL_TABLET | Freq: Four times a day (QID) | ORAL | Status: DC | PRN

## 2024-11-01 MED ORDER — DIPHENHYDRAMINE HCL 12.5 MG/5ML PO ELIX
12.5000 mg | ORAL_SOLUTION | ORAL | Status: DC | PRN
  Filled 2024-11-01: qty 10

## 2024-11-01 MED ORDER — NYSTATIN-TRIAMCINOLONE 100000-0.1 UNIT/GM-% EX CREA
TOPICAL_CREAM | Freq: Two times a day (BID) | CUTANEOUS | Status: DC
  Filled 2024-11-01: qty 30

## 2024-11-01 MED ORDER — HYDROMORPHONE HCL 1 MG/ML IJ SOLN
INTRAMUSCULAR | Status: AC
  Filled 2024-11-01: qty 2

## 2024-11-01 MED ORDER — BUPIVACAINE-EPINEPHRINE (PF) 0.25% -1:200000 IJ SOLN
INTRAMUSCULAR | Status: AC
  Filled 2024-11-01: qty 30

## 2024-11-01 MED ORDER — OXYCODONE HCL 5 MG PO TABS: 5.0000 mg | ORAL_TABLET | ORAL | 0 refills | Status: AC | PRN

## 2024-11-01 MED ORDER — NYSTATIN-TRIAMCINOLONE 100000-0.1 UNIT/GM-% EX OINT
1.0000 | TOPICAL_OINTMENT | Freq: Two times a day (BID) | CUTANEOUS | Status: DC
  Filled 2024-11-01: qty 15

## 2024-11-01 MED ORDER — DOCUSATE SODIUM 100 MG PO CAPS
100.0000 mg | ORAL_CAPSULE | Freq: Two times a day (BID) | ORAL | Status: DC
  Administered 2024-11-01 – 2024-11-07 (×12): 100 mg via ORAL
  Filled 2024-11-01 (×12): qty 1

## 2024-11-01 MED ORDER — ACETAMINOPHEN 10 MG/ML IV SOLN
INTRAVENOUS | Status: AC
Start: 2024-11-01 — End: 2024-11-01
  Filled 2024-11-01: qty 100

## 2024-11-01 MED ORDER — FENTANYL CITRATE (PF) 50 MCG/ML IJ SOSY
PREFILLED_SYRINGE | INTRAMUSCULAR | Status: AC
  Filled 2024-11-01: qty 2

## 2024-11-01 MED ORDER — FENTANYL CITRATE (PF) 250 MCG/5ML IJ SOLN
INTRAMUSCULAR | Status: DC | PRN
  Administered 2024-11-01 (×2): 50 ug via INTRAVENOUS

## 2024-11-01 MED ORDER — ONDANSETRON HCL 4 MG/2ML IJ SOLN: 4.0000 mg | Freq: Four times a day (QID) | INTRAMUSCULAR | Status: DC | PRN

## 2024-11-01 MED ORDER — SODIUM CHLORIDE 0.9 % IV SOLN: INTRAVENOUS | Status: DC

## 2024-11-01 MED ORDER — METHOCARBAMOL 500 MG PO TABS
500.0000 mg | ORAL_TABLET | Freq: Four times a day (QID) | ORAL | Status: DC | PRN
  Administered 2024-11-01 – 2024-11-07 (×8): 500 mg via ORAL
  Filled 2024-11-01 (×9): qty 1

## 2024-11-01 MED ORDER — ASPIRIN 81 MG PO CHEW
81.0000 mg | CHEWABLE_TABLET | Freq: Two times a day (BID) | ORAL | Status: DC
  Administered 2024-11-01 – 2024-11-07 (×12): 81 mg via ORAL
  Filled 2024-11-01 (×12): qty 1

## 2024-11-01 MED ORDER — TRANEXAMIC ACID-NACL 1000-0.7 MG/100ML-% IV SOLN
1000.0000 mg | INTRAVENOUS | Status: AC
  Administered 2024-11-01: 1000 mg via INTRAVENOUS
  Filled 2024-11-01: qty 100

## 2024-11-01 MED ORDER — PROPOFOL 500 MG/50ML IV EMUL
INTRAVENOUS | Status: DC | PRN
  Administered 2024-11-01: 80 ug/kg/min via INTRAVENOUS

## 2024-11-01 MED ORDER — ORAL CARE MOUTH RINSE: 15.0000 mL | Freq: Once | OROMUCOSAL | Status: DC

## 2024-11-01 MED ORDER — ONDANSETRON HCL 4 MG PO TABS: 4.0000 mg | ORAL_TABLET | Freq: Three times a day (TID) | ORAL | 0 refills | Status: AC | PRN

## 2024-11-01 MED ORDER — HYDROMORPHONE HCL 1 MG/ML IJ SOLN: 0.5000 mg | INTRAMUSCULAR | Status: DC | PRN

## 2024-11-01 MED ORDER — MAGNESIUM OXIDE -MG SUPPLEMENT 400 (240 MG) MG PO TABS
400.0000 mg | ORAL_TABLET | Freq: Every day | ORAL | Status: DC
  Administered 2024-11-02 – 2024-11-07 (×6): 400 mg via ORAL
  Filled 2024-11-01 (×6): qty 1

## 2024-11-01 MED ORDER — MIDAZOLAM HCL (PF) 2 MG/2ML IJ SOLN
INTRAMUSCULAR | Status: DC | PRN
  Administered 2024-11-01: 2 mg via INTRAVENOUS

## 2024-11-01 MED ORDER — AVENOVA 0.01 % EX SOLN
1.0000 | Freq: Two times a day (BID) | CUTANEOUS | Status: DC
  Administered 2024-11-04 – 2024-11-07 (×5): 1 via OPHTHALMIC

## 2024-11-01 MED ORDER — FLUOCINONIDE 0.05 % EX SOLN: 1.0000 | Freq: Every day | CUTANEOUS | Status: DC

## 2024-11-01 MED ORDER — PANTOPRAZOLE SODIUM 40 MG PO TBEC
40.0000 mg | DELAYED_RELEASE_TABLET | Freq: Every day | ORAL | Status: DC
  Administered 2024-11-01 – 2024-11-07 (×7): 40 mg via ORAL
  Filled 2024-11-01 (×7): qty 1

## 2024-11-01 MED ORDER — VANCOMYCIN HCL 1500 MG/300ML IV SOLN
1500.0000 mg | INTRAVENOUS | Status: AC
  Administered 2024-11-01: 1500 mg via INTRAVENOUS
  Filled 2024-11-01: qty 300

## 2024-11-01 MED ORDER — MENTHOL 3 MG MT LOZG
1.0000 | LOZENGE | OROMUCOSAL | Status: DC | PRN
  Filled 2024-11-01: qty 9

## 2024-11-01 MED ORDER — OXYCODONE HCL 5 MG PO TABS
5.0000 mg | ORAL_TABLET | ORAL | Status: DC | PRN
  Administered 2024-11-01 – 2024-11-04 (×13): 10 mg via ORAL
  Administered 2024-11-05 – 2024-11-07 (×7): 5 mg via ORAL
  Filled 2024-11-01 (×8): qty 2
  Filled 2024-11-01: qty 1
  Filled 2024-11-01: qty 2
  Filled 2024-11-01: qty 1
  Filled 2024-11-01: qty 2
  Filled 2024-11-01: qty 1
  Filled 2024-11-01 (×2): qty 2
  Filled 2024-11-01 (×3): qty 1
  Filled 2024-11-01 (×2): qty 2
  Filled 2024-11-01: qty 1

## 2024-11-01 MED ORDER — METHOCARBAMOL 1000 MG/10ML IJ SOLN: 500.0000 mg | Freq: Four times a day (QID) | INTRAMUSCULAR | Status: DC | PRN

## 2024-11-01 MED ORDER — OXYCODONE HCL 5 MG/5ML PO SOLN: 5.0000 mg | Freq: Once | ORAL | Status: DC | PRN

## 2024-11-01 MED ORDER — MIDAZOLAM HCL 2 MG/2ML IJ SOLN
INTRAMUSCULAR | Status: AC
  Filled 2024-11-01: qty 2

## 2024-11-01 MED ORDER — BUPIVACAINE IN DEXTROSE 0.75-8.25 % IT SOLN
INTRATHECAL | Status: DC | PRN
  Administered 2024-11-01: 12.75 mg via INTRATHECAL

## 2024-11-01 MED ORDER — VENLAFAXINE HCL ER 150 MG PO CP24
150.0000 mg | ORAL_CAPSULE | Freq: Every day | ORAL | Status: DC
  Administered 2024-11-02 – 2024-11-07 (×6): 150 mg via ORAL
  Filled 2024-11-01 (×6): qty 1

## 2024-11-01 MED ORDER — PROPOFOL 10 MG/ML IV BOLUS
INTRAVENOUS | Status: DC | PRN
  Administered 2024-11-01: 20 mg via INTRAVENOUS

## 2024-11-01 MED ORDER — WATER FOR IRRIGATION, STERILE IR SOLN
Status: DC | PRN
  Administered 2024-11-01: 2000 mL

## 2024-11-01 MED ORDER — DEXAMETHASONE SOD PHOSPHATE PF 10 MG/ML IJ SOLN: 4.0000 mg | Freq: Once | INTRAMUSCULAR | Status: DC

## 2024-11-01 MED ORDER — MEXILETINE HCL 150 MG PO CAPS
150.0000 mg | ORAL_CAPSULE | Freq: Two times a day (BID) | ORAL | Status: DC
  Administered 2024-11-01 – 2024-11-07 (×12): 150 mg via ORAL
  Filled 2024-11-01 (×15): qty 1

## 2024-11-01 MED ORDER — DOCUSATE SODIUM 100 MG PO CAPS: 100.0000 mg | ORAL_CAPSULE | Freq: Two times a day (BID) | ORAL | Status: DC

## 2024-11-01 MED ORDER — HYDROMORPHONE HCL 1 MG/ML IJ SOLN
0.2500 mg | INTRAMUSCULAR | Status: DC | PRN
  Administered 2024-11-01 (×4): 0.5 mg via INTRAVENOUS

## 2024-11-01 MED ORDER — 0.9 % SODIUM CHLORIDE (POUR BTL) OPTIME
TOPICAL | Status: DC | PRN
  Administered 2024-11-01: 1000 mL

## 2024-11-01 MED ORDER — FLUTICASONE FUROATE-VILANTEROL 100-25 MCG/ACT IN AEPB
1.0000 | INHALATION_SPRAY | Freq: Every day | RESPIRATORY_TRACT | Status: DC
  Filled 2024-11-01: qty 28

## 2024-11-01 MED ORDER — ROSUVASTATIN CALCIUM 5 MG PO TABS
5.0000 mg | ORAL_TABLET | Freq: Every day | ORAL | Status: DC
  Administered 2024-11-01 – 2024-11-06 (×6): 5 mg via ORAL
  Filled 2024-11-01 (×6): qty 1

## 2024-11-01 MED ORDER — OMEPRAZOLE 40 MG PO CPDR: 40.0000 mg | DELAYED_RELEASE_CAPSULE | Freq: Every day | ORAL | 0 refills | Status: AC

## 2024-11-01 SURGICAL SUPPLY — 56 items
BAG COUNTER SPONGE SURGICOUNT (BAG) IMPLANT
BLADE SAG 18X100X1.27 (BLADE) ×1 IMPLANT
BLADE SAGITTAL 11X90X1.07 (BLADE) IMPLANT
BLADE SAW SAG 35X64 .89 (BLADE) ×1 IMPLANT
BLADE SAW SGTL 11.0X1.19X90.0M (BLADE) IMPLANT
BNDG COHESIVE 3X5 TAN ST LF (GAUZE/BANDAGES/DRESSINGS) ×1 IMPLANT
BNDG ELASTIC 6X10 VLCR STRL LF (GAUZE/BANDAGES/DRESSINGS) ×1 IMPLANT
BOWL SMART MIX CTS (DISPOSABLE) IMPLANT
CEMENT BONE R 1X40 (Cement) IMPLANT
CHLORAPREP W/TINT 26 (MISCELLANEOUS) ×2 IMPLANT
COMP FEM CMT STD KNEE 9 LT STL (Joint) IMPLANT
COVER SURGICAL LIGHT HANDLE (MISCELLANEOUS) ×1 IMPLANT
CUFF TRNQT CYL 34X4.125X (TOURNIQUET CUFF) ×1 IMPLANT
DERMABOND ADVANCED .7 DNX12 (GAUZE/BANDAGES/DRESSINGS) ×2 IMPLANT
DRAPE INCISE IOBAN 85X60 (DRAPES) ×1 IMPLANT
DRAPE SHEET LG 3/4 BI-LAMINATE (DRAPES) ×1 IMPLANT
DRAPE U-SHAPE 47X51 STRL (DRAPES) ×1 IMPLANT
DRSG AQUACEL AG ADV 3.5X10 (GAUZE/BANDAGES/DRESSINGS) ×1 IMPLANT
ELECT PENCIL ROCKER SW 15FT (MISCELLANEOUS) IMPLANT
ELECT REM PT RETURN 15FT ADLT (MISCELLANEOUS) ×1 IMPLANT
GAUZE PAD ABD 8X10 STRL (GAUZE/BANDAGES/DRESSINGS) IMPLANT
GAUZE SPONGE 4X4 12PLY STRL (GAUZE/BANDAGES/DRESSINGS) ×1 IMPLANT
GLOVE BIO SURGEON STRL SZ 6.5 (GLOVE) ×2 IMPLANT
GLOVE BIOGEL PI IND STRL 6.5 (GLOVE) ×1 IMPLANT
GLOVE BIOGEL PI IND STRL 8 (GLOVE) ×1 IMPLANT
GLOVE SURG ORTHO 8.0 STRL STRW (GLOVE) ×2 IMPLANT
GOWN STRL REUS W/ TWL XL LVL3 (GOWN DISPOSABLE) ×2 IMPLANT
HOLDER FOLEY CATH W/STRAP (MISCELLANEOUS) ×1 IMPLANT
HOOD PEEL AWAY T7 (MISCELLANEOUS) ×3 IMPLANT
INSERT ASF VE PS 14 6-9/EF LT (Insert) IMPLANT
KIT TURNOVER KIT A (KITS) ×1 IMPLANT
LAVAGE JET IRRISEPT WOUND (IRRIGATION / IRRIGATOR) IMPLANT
MANIFOLD NEPTUNE II (INSTRUMENTS) ×1 IMPLANT
MARKER SKIN DUAL TIP RULER LAB (MISCELLANEOUS) ×1 IMPLANT
NS IRRIG 1000ML POUR BTL (IV SOLUTION) ×1 IMPLANT
PACK TOTAL KNEE CUSTOM (KITS) ×1 IMPLANT
PADDING CAST COTTON 6X4 STRL (CAST SUPPLIES) IMPLANT
PENCIL SMOKE EVACUATOR (MISCELLANEOUS) ×1 IMPLANT
PIN DRILL HDLS TROCAR 75 4PK (PIN) IMPLANT
SCREW HEADED 33MM KNEE (MISCELLANEOUS) IMPLANT
SET HNDPC FAN SPRY TIP SCT (DISPOSABLE) ×1 IMPLANT
SOLUTION IRRIG SURGIPHOR (IV SOLUTION) IMPLANT
SOLUTION PRONTOSAN WOUND 350ML (IRRIGATION / IRRIGATOR) IMPLANT
STEM POLY PAT PLY 32M KNEE (Knees) IMPLANT
STEM TIB ST PERS 14+30 (Stem) IMPLANT
STEM TIBIA 5 DEG SZ E L KNEE (Knees) IMPLANT
STRIP CLOSURE SKIN 1/2X4 (GAUZE/BANDAGES/DRESSINGS) ×1 IMPLANT
SUT MNCRL AB 3-0 PS2 18 (SUTURE) ×1 IMPLANT
SUT STRATAFIX 14 PDO 48 VLT (SUTURE) ×1 IMPLANT
SUT VIC AB 1 CT1 36 (SUTURE) IMPLANT
SUT VIC AB 2-0 CT2 27 (SUTURE) ×2 IMPLANT
SUTURE STRATFX 0 PDS 27 VIOLET (SUTURE) ×1 IMPLANT
TRAY FOLEY MTR SLVR 14FR STAT (SET/KITS/TRAYS/PACK) IMPLANT
TUBE SUCTION HIGH CAP CLEAR NV (SUCTIONS) ×1 IMPLANT
UNDERPAD 30X36 HEAVY ABSORB (UNDERPADS AND DIAPERS) ×1 IMPLANT
WRAP KNEE MAXI GEL POST OP (GAUZE/BANDAGES/DRESSINGS) ×1 IMPLANT

## 2024-11-01 NOTE — Progress Notes (Signed)
 Orthopedic Tech Progress Note Patient Details:  Kelly Thornton 06-29-1961 [id number] Left bone foam at bedside per RN request.  Ortho Devices Type of Ortho Device: Bone foam zero knee Ortho Device/Splint Location: LLE Ortho Device/Splint Interventions: Ordered   Post Interventions Instructions Provided: Adjustment of device, Care of device, Poper ambulation with device  [name] 11/01/2024, 10:23 AM

## 2024-11-01 NOTE — Transfer of Care (Signed)
 Immediate Anesthesia Transfer of Care Note  Patient: Kelly Thornton  Procedure(s) Performed: ARTHROPLASTY, KNEE, TOTAL (Left: Knee)  Patient Location: PACU  Anesthesia Type:MAC, Regional, and Spinal  Level of Consciousness: drowsy and patient cooperative  Airway & Oxygen Therapy: Patient Spontanous Breathing and Patient connected to face mask oxygen  Post-op Assessment: Report given to RN and Post -op Vital signs reviewed and stable  Post vital signs: Reviewed and stable  Last Vitals:  Vitals Value Taken Time  BP 159/95 11/01/24 10:15  Temp    Pulse 93 11/01/24 10:16  Resp 9 11/01/24 10:16  SpO2 100 % 11/01/24 10:16  Vitals shown include unfiled device data.  Last Pain:  Vitals:   11/01/24 0549  TempSrc:   PainSc: 0-No pain         Complications: No notable events documented.

## 2024-11-01 NOTE — Anesthesia Postprocedure Evaluation (Signed)
 Anesthesia Post Note  Patient: Kelly Thornton  Procedure(s) Performed: ARTHROPLASTY, KNEE, TOTAL (Left: Knee)     Patient location during evaluation: Nursing Unit Anesthesia Type: Regional and Spinal Level of consciousness: oriented and awake and alert Pain management: pain level controlled Vital Signs Assessment: post-procedure vital signs reviewed and stable Respiratory status: spontaneous breathing and respiratory function stable Cardiovascular status: blood pressure returned to baseline and stable Postop Assessment: no headache, no backache, no apparent nausea or vomiting and patient able to bend at knees Anesthetic complications: no   No notable events documented.  Last Vitals:  Vitals:   11/01/24 1215 11/01/24 1230  BP: (!) 169/76 (!) 161/77  Pulse: 70 74  Resp: 15 17  Temp:  (!) 36.3 C  SpO2: 100% 99%    Last Pain:  Vitals:   11/01/24 1215  TempSrc:   PainSc: Asleep                 [name]

## 2024-11-01 NOTE — Interval H&P Note (Signed)

## 2024-11-01 NOTE — Discharge Instructions (Signed)
 INSTRUCTIONS AFTER JOINT REPLACEMENT   Remove items at home which could result in a fall. This includes throw rugs or furniture in walking pathways ICE to the affected joint every three hours while awake for 30 minutes at a time, for at least the first 3-5 days, and then as needed for pain and swelling.  Continue to use ice for pain and swelling. You may notice swelling that will progress down to the foot and ankle.  This is normal after surgery.  Elevate your leg when you are not up walking on it.   Continue to use the breathing machine you got in the hospital (incentive spirometer) which will help keep your temperature down.  It is common for your temperature to cycle up and down following surgery, especially at night when you are not up moving around and exerting yourself.  The breathing machine keeps your lungs expanded and your temperature down.  DIET:  As you were doing prior to hospitalization, we recommend a well-balanced diet.  DRESSING / WOUND CARE / SHOWERING:  Keep the surgical dressing until follow up.  The dressing is NOT waterproof, so this needs to be covered up when showering.  After 3-5 days, you may change the bandage as needed if it becomes soiled.  IF THE DRESSING FALLS OFF or the wound gets wet inside, change the dressing with sterile gauze.  Please use good hand washing techniques before changing the dressing.  Do not use any lotions or creams on the incision until instructed by your surgeon.    ACTIVITY  Increase activity slowly as tolerated, but follow the weight bearing instructions below.   No driving for 6 weeks or until further direction given by your physician.  You cannot drive while taking narcotics.  No lifting or carrying greater than 10 lbs. until further directed by your surgeon. Avoid periods of inactivity such as sitting longer than an hour when not asleep. This helps prevent blood clots.  You may return to work once you are authorized by your doctor.   WEIGHT  BEARING: Weight bearing as tolerated with assist device (walker, cane, etc) as directed, use it as long as suggested by your surgeon or therapist, typically at least 4-6 weeks.  EXERCISES  Results after joint replacement surgery are often greatly improved when you follow the exercise, range of motion and muscle strengthening exercises prescribed by your doctor. Safety measures are also important to protect the joint from further injury. Any time any of these exercises cause you to have increased pain or swelling, decrease what you are doing until you are comfortable again and then slowly increase them. If you have problems or questions, call your caregiver or physical therapist for advice.   Rehabilitation is important following a joint replacement. After just a few days of immobilization, the muscles of the leg can become weakened and shrink (atrophy).  These exercises are designed to build up the tone and strength of the thigh and leg muscles and to improve motion. Often times heat used for twenty to thirty minutes before working out will loosen up your tissues and help with improving the range of motion but do not use heat for the first two weeks following surgery (sometimes heat can increase post-operative swelling).   These exercises can be done on a training (exercise) mat, on the floor, on a table or on a bed. Use whatever works the best and is most comfortable for you.    Use music or television while you are exercising  so that the exercises are a pleasant break in your day. This will make your life better with the exercises acting as a break in your routine that you can look forward to.   Perform all exercises about fifteen times, three times per day or as directed.  You should exercise both the operative leg and the other leg as well.  Exercises include:   Quad Sets - Tighten up the muscle on the front of the thigh (Quad) and hold for 5-10 seconds.   Straight Leg Raises - With your knee  straight (if you were given a brace, keep it on), lift the leg to 60 degrees, hold for 3 seconds, and slowly lower the leg.  Perform this exercise against resistance later as your leg gets stronger.  Leg Slides: Lying on your back, slowly slide your foot toward your buttocks, bending your knee up off the floor (only go as far as is comfortable). Then slowly slide your foot back down until your leg is flat on the floor again.  Angel Wings: Lying on your back spread your legs to the side as far apart as you can without causing discomfort.  Hamstring Strength:  Lying on your back, push your heel against the floor with your leg straight by tightening up the muscles of your buttocks.  Repeat, but this time bend your knee to a comfortable angle, and push your heel against the floor.  You may put a pillow under the heel to make it more comfortable if necessary.   A rehabilitation program following joint replacement surgery can speed recovery and prevent re-injury in the future due to weakened muscles. Contact your doctor or a physical therapist for more information on knee rehabilitation.   CONSTIPATION:  Constipation is defined medically as fewer than three stools per week and severe constipation as less than one stool per week.  Even if you have a regular bowel pattern at home, your normal regimen is likely to be disrupted due to multiple reasons following surgery.  Combination of anesthesia, postoperative narcotics, change in appetite and fluid intake all can affect your bowels.   YOU MUST use at least one of the following options; they are listed in order of increasing strength to get the job done.  They are all available over the counter, and you may need to use some, POSSIBLY even all of these options:    Drink plenty of fluids (prune juice may be helpful) and high fiber foods Colace 100 mg by mouth twice a day  Senokot for constipation as directed and as needed Dulcolax (bisacodyl), take with full glass  of water   Miralax (polyethylene glycol) once or twice a day as needed.  If you have tried all these things and are unable to have a bowel movement in the first 3-4 days after surgery call either your surgeon or your primary doctor.    If you experience loose stools or diarrhea, hold the medications until you stool forms back up.  If your symptoms do not get better within 1 week or if they get worse, check with your doctor.  If you experience the worst abdominal pain ever or develop nausea or vomiting, please contact the office immediately for further recommendations for treatment.  ITCHING:  If you experience itching with your medications, try taking only a single pain pill, or even half a pain pill at a time.  You can also use Benadryl  over the counter for itching or also to help with sleep.  TED HOSE STOCKINGS:  Use stockings on both legs until for at least 2 weeks or as directed by physician office. They may be removed at night for sleeping.  MEDICATIONS:  See your medication summary on the "After Visit Summary" that nursing will review with you.  You may have some home medications which will be placed on hold until you complete the course of blood thinner medication.  It is important for you to complete the blood thinner medication as prescribed.  Blood clot prevention (DVT Prophylaxis): After surgery you are at an increased risk for a blood clot.  You were prescribed a blood thinner, Aspirin 81mg , to be taken twice daily for a total of 4 weeks from surgery to help reduce your risk of getting a blood clot.  Signs of a pulmonary embolus (blood clot in the lungs) include sudden short of breath, feeling lightheaded or dizzy, chest pain with a deep breath, rapid pulse rapid breathing.  Signs of a blood clot in your arms or legs include new unexplained swelling and cramping, warm, red or darkened skin around the painful area.  Please call the office or 911 right away if these signs or symptoms  develop.  PRECAUTIONS:   If you experience chest pain or shortness of breath - call 911 immediately for transfer to the hospital emergency department.   If you develop a fever greater that 101 F, purulent drainage from wound, increased redness or drainage from wound, foul odor from the wound/dressing, or calf pain - CONTACT YOUR SURGEON.                                                   FOLLOW-UP APPOINTMENTS:  If you do not already have a post-op appointment, please call the office for an appointment to be seen by your surgeon.  Guidelines for how soon to be seen are listed in your "After Visit Summary", but are typically between 2-3 weeks after surgery.  If you have a specialized bandage, you may be told to follow up 1 week after surgery.  OTHER INSTRUCTIONS:  Knee Replacement:  Do not place pillow under knee, focus on keeping the knee straight while resting.  Place foam block, curve side up under heel at all times except when walking.  DO NOT modify, tear, cut, or change the foam block in any way.  POST-OPERATIVE OPIOID TAPER INSTRUCTIONS: It is important to wean off of your opioid medication as soon as possible. If you do not need pain medication after your surgery it is ok to stop day one. Opioids include: Codeine, Hydrocodone(Norco, Vicodin), Oxycodone (Percocet, oxycontin ) and hydromorphone  amongst others.  Long term and even short term use of opiods can cause: Increased pain response Dependence Constipation Depression Respiratory depression And more.  Withdrawal symptoms can include Flu like symptoms Nausea, vomiting And more Techniques to manage these symptoms Hydrate well Eat regular healthy meals Stay active Use relaxation techniques(deep breathing, meditating, yoga) Do Not substitute Alcohol to help with tapering If you have been on opioids for less than two weeks and do not have pain than it is ok to stop all together.  Plan to wean off of opioids This plan should start  within one week post op of your joint replacement. Maintain the same interval or time between taking each dose and first decrease the dose.  Cut the total  daily intake of opioids by one tablet each day Next start to increase the time between doses. The last dose that should be eliminated is the evening dose.   MAKE SURE YOU:  Understand these instructions.  Get help right away if you are not doing well or get worse.    Thank you for letting us  be a part of your medical care team.  It is a privilege we respect greatly.  We hope these instructions will help you stay on track for a fast and full recovery!

## 2024-11-01 NOTE — Anesthesia Procedure Notes (Signed)
 Anesthesia Regional Block: Adductor canal block   Pre-Anesthetic Checklist: , timeout performed,  Correct Patient, Correct Site, Correct Laterality,  Correct Procedure, Correct Position, site marked,  Risks and benefits discussed,  Surgical consent,  Pre-op evaluation,  At surgeon's request and post-op pain management  Laterality: Lower and Left  Prep: chloraprep       Needles:  Injection technique: Single-shot  Needle Type: Echogenic Needle     Needle Length: 9cm  Needle Gauge: 22     Additional Needles:   Procedures:,,,, ultrasound used (permanent image in chart),,    Narrative:  Start time: 11/01/2024 7:01 AM End time: 11/01/2024 7:07 AM Injection made incrementally with aspirations every 5 mL.  Performed by: Personally  Anesthesiologist: Jefm Garnette LABOR, MD  Additional Notes: Block assessed prior to surgery. Pt tolerated procedure well.

## 2024-11-01 NOTE — Op Note (Signed)
 DATE OF SURGERY:  11/01/2024 TIME: 9:39 AM  PATIENT NAME:  Kelly Thornton   AGE: 63 y.o.    PRE-OPERATIVE DIAGNOSIS:  End-stage left knee osteoarthritis  POST-OPERATIVE DIAGNOSIS:  Same  PROCEDURE: Cemented left nickel free total Knee Arthroplasty  SURGEON:  Lamari Youngers A Sequoyah Counterman, MD   ASSISTANT: Bernarda Mclean, PA-C, present and scrubbed throughout the case, critical for assistance with exposure, retraction, instrumentation, and closure.   OPERATIVE IMPLANTS:  Cemented Zimmer persona size 9 standard nickel free PS femur, left size E titanium tibial baseplate with 30 mm modular tapered stem extension, 14 mm CPS poly insert, 32 mm all poly cemented patella Implant Name Type Inv. Item Serial No. Manufacturer Lot No. LRB No. Used Action  CEMENT BONE R 1X40 - [id number] Cement CEMENT BONE R 1X40  [employer]IMMER [employer]ON(ORTH,TRAU,BIO,SG) [id number] Left 1 Implanted  CEMENT BONE R 1X40 - [id number] Cement CEMENT BONE R 1X40  [employer]IM[employer]ON(ORTH,TRAU,BIO,SG) [id number] Left 1 Implanted  STEM TIB ST PERS 14+30 - [id number] Stem STEM TIB ST PERS 14+30  ZIMMER [employer]ON(ORTH,TRAU,BIO,SG) [id number] Left 1 Implanted  STEM POLY PAT PLY 62M KNEE - [id number] Knees STEM POLY PAT PLY 62M KNEE  ZIMMER [employer]ON(ORTH,TRAU,BIO,SG) [id number] Left 1 Implanted  STEM TIBIA 5 DEG SZ E L KNEE - [id number] Knees STEM TIBIA 5 DEG SZ E L KNEE  ZIMMER [employer]ON(ORTH,TRAU,BIO,SG) [id number] Left 1 Implanted  Persona knee femur cemented Posterior Stabilized (PS)    ZIMMER KNEE [id number] Left 1 Implanted  INSERT ASF VE PS 14 6-9/EF LT - [id number] Insert INSERT ASF VE PS 14 6-9/EF LT  ZIMMER [employer]ON(ORTH,TRAU,BIO,SG) [id number] Left 1 Implanted      PREOPERATIVE INDICATIONS:  Kelly Thornton is a 63 y.o. year old female with end stage bone on bone degenerative arthritis of the knee who failed conservative treatment, including injections, antiinflammatories, activity modification, and assistive devices, and had significant impairment of their  activities of daily living, and elected for Total Knee Arthroplasty.   The risks, benefits, and alternatives were discussed at length including but not limited to the risks of infection, bleeding, nerve injury, stiffness, blood clots, the need for revision surgery, cardiopulmonary complications, among others, and they were willing to proceed.  OPERATIVE FINDINGS AND UNIQUE ASPECTS OF THE CASE: Severe preop valgus and flexion contracture, perform lateral hide crusting of the IT band, still tight elected to use a CPS poly insert for balance and stability  ESTIMATED BLOOD LOSS: 50cc  OPERATIVE DESCRIPTION:   Once adequate anesthesia was induced, preoperative antibiotics, 1 gm of vanc and 400mg  cipro ,1 gm of Tranexamic Acid, and 8 mg of Decadron  administered, the patient was positioned supine with a left thigh tourniquet placed.  The left lower extremity was prepped and draped in sterile fashion.  A time-  out was performed identifying the patient, planned procedure, and the appropriate extremity.     The leg was  exsanguinated, tourniquet elevated to 250 mmHg.  A midline incision was  made followed by median parapatellar arthrotomy. Anterior horn of the medial meniscus was released and resected. A medial release was performed, the infrapatellar fat pad was resected with care taken to protect the patellar tendon. The suprapatellar fat was removed to exposed the distal anterior femur. The anterior horn of the lateral meniscus and ACL were released.    Following initial  exposure, I first started with the femur  The femoral  canal was opened with a drill, canal was suctioned to try to prevent fat emboli.  An  intramedullary rod was passed  set at 6 degrees valgus, 10 mm. The distal femur was resected.  Following this resection, the tibia was  subluxated anteriorly.  Using the extramedullary guide, 2 mm of bone was resected off   the lateral defect on the tibia.  The extension gap was still significantly  tight as well as flexion.  Elected to resect additional 4 mm off the proximal tibia.  We confirmed the gap would be  stable medially and laterally with a size 10mm spacer block as well as confirmed that the tibial cut was perpendicular in the coronal plane, checking with an alignment rod.    Once this was done, the posterior femoral referencing femoral sizer was placed under to the posterior condyles with 5 degrees of external rotational which was parallel to the transepicondylar axis and perpendicular to [employer]. The femur was sized to be a size 9 in the anterior-  posterior dimension. The  anterior, posterior, and  chamfer cuts were made without difficulty nor   notching making certain that I was along the anterior cortex to help  with flexion gap stability. Next a laminar spreader was placed with the knee in flexion and the medial lateral menisci were resected.  5 cc of the Exparel  mixture was injected in the medial side of the back of the knee and 3 cc in the lateral side.  1/2 inch curved osteotome was used to resect posterior osteophyte that was then removed with a pituitary rongeur.       At this point, the tibia was sized to be a size E.  The size E tray was  then pinned in position. Trial reduction was now carried with a 9 femur, E tibia, a 10 mm MC insert.  Upsize all the way to 14 and 16 mm.  I piecrust of the lateral structures given how tight they were.  There was still relative medial laxity.  Elected to convert to the CPS insert.  PS trials were then inserted and the PS box was cut with a narrow sawblade.  Trials were then reinserted.  The knee had full extension and was stable to varus valgus stress in extension.     Attention was next directed to the patella.  Precut  measurement was noted to be 24 mm.  I resected down to 14 mm and used a  32mm patellar button to restore patellar height as well as cover the cut surface.     The patella lug holes were drilled and a 32 mm patella  poly trial was placed.    The knee was brought to full extension with good flexion stability with the patella tracking through the trochlea without application of pressure.     Next the femoral component was again assessed and determined to be seated and appropriately lateralized.  The femoral lug holes were drilled.  The femoral component was then removed. Tibial component was again assessed and felt to be seated and appropriately rotated with the medial third of the tubercle. The tibia was then drilled, and keel punched.     Final components were  opened and cement was mixed.      Final implants were then  cemented onto cleaned and dried cut surfaces of bone with the knee brought to extension with a 14 mm PS poly.  The knee was irrigated with sterile Betadine diluted in saline as well as pulse lavage normal saline. The synovial lining was  then injected a dilute Exparel  with 30cc of 0.25% marcaine  with epinephrine .  Once the cement had fully cured, excess cement was removed throughout the knee.  I confirmed that I was satisfied with the range of motion and stability, and the final 14 mm CPS poly insert was chosen.  It was placed into the knee.         The tourniquet had been let down.  No significant hemostasis was required.  The medial parapatellar arthrotomy was then reapproximated using #1 Vicryl and #1 Stratafix sutures with the knee  in flexion.  The remaining wound was closed with 0 stratafix, 2-0 Vicryl, and running 3-0 Monocryl. The knee was cleaned, dried, dressed sterilely using Dermabond, 4 x 4 gauze, ABD, and Ace wrap.  The patient was then brought to recovery room in stable condition, tolerating the procedure  well. There were no complications.   Post op recs: WB: WBAT Abx: ancef Imaging: PACU xrays DVT prophylaxis: Aspirin 81mg  BID x4 weeks Follow up: 2 weeks after surgery for a wound check with Dr. Edna at [facility].  Address: [address], [place], [place] [address]  Office Phone: [phone]  Toribio Edna, MD Orthopaedic Surgery

## 2024-11-01 NOTE — Evaluation (Signed)
 Physical Therapy Evaluation Patient Details Name: Kelly Thornton MRN: [id number] DOB: [birth year] Today's Date: 11/01/2024  History of Present Illness  Pt is 63 yo female s/p L TKA on 11/01/24.  Pt with hx including but not limited to adrenal mass, Raynaud's disease, orthostatic BP, hearing loss bil (R worse than L), obesity, DM2, arthritis, anxiety, fibromyalgia, HLD, HTN  Clinical Impression  Pt is s/p TKA resulting in the deficits listed below (see PT Problem List). Pt independent at baseline, has support, and ramp to enter home.  Today, pt with good pain control (at rest) and quad activation.  She was able to ambulate 5' with RW and min A but further ambulation limited by pain in standing, eased at rest. Pt expected to progress well with therapy. Pt will benefit from acute skilled PT to increase their independence and safety with mobility to allow discharge.          If plan is discharge home, recommend the following: A little help with walking and/or transfers;A little help with bathing/dressing/bathroom;Assistance with cooking/housework;Help with stairs or ramp for entrance   Can travel by private vehicle        Equipment Recommendations Rolling walker (2 wheels)  Recommendations for Other Services       Functional Status Assessment Patient has had a recent decline in their functional status and demonstrates the ability to make significant improvements in function in a reasonable and predictable amount of time.     Precautions / Restrictions Precautions Precautions: Fall;Knee Restrictions Weight Bearing Restrictions Per Provider Order: Yes LLE Weight Bearing Per Provider Order: Weight bearing as tolerated      Mobility  Bed Mobility Overal bed mobility: Needs Assistance Bed Mobility: Supine to Sit, Sit to Supine     Supine to sit: Min assist, Used rails, HOB elevated Sit to supine: Min assist, Used rails, HOB elevated        Transfers Overall transfer level: Needs  assistance Equipment used: Rolling walker (2 wheels) Transfers: Sit to/from Stand Sit to Stand: Min assist, From elevated surface           General transfer comment: cues for hand placement and L LE management    Ambulation/Gait Ambulation/Gait assistance: Min assist Gait Distance (Feet): 5 Feet Assistive device: Rolling walker (2 wheels) Gait Pattern/deviations: Step-to pattern, Decreased weight shift to left, Shuffle, Antalgic Gait velocity: decreased     General Gait Details: good stability, limited distance due to pain  Stairs            Wheelchair Mobility     Tilt Bed    Modified Rankin (Stroke Patients Only)       Balance Overall balance assessment: Needs assistance Sitting-balance support: No upper extremity supported Sitting balance-Leahy Scale: Good     Standing balance support: Bilateral upper extremity supported, Reliant on assistive device for balance Standing balance-Leahy Scale: Poor Standing balance comment: steady with RW                             Pertinent Vitals/Pain Pain Assessment Pain Assessment: 0-10 Pain Score: 2  Pain Location: L knee Pain Descriptors / Indicators: Aching, Constant Pain Intervention(s): Limited activity within patient's tolerance, Monitored during session, Premedicated before session, Repositioned, Ice applied    Home Living Family/patient expects to be discharged to:: Private residence Living Arrangements: Spouse/significant other Available Help at Discharge: Family;Available 24 hours/day (spouse and sister to assist) Type of Home: House Home Access: Ramped entrance  Home Layout: Multi-level;Bed/bath upstairs Home Equipment: Grab bars - tub/shower;Shower seat;Grab bars - toilet;Rollator (4 wheels);Cane - single point;Cane - quad      Prior Function Prior Level of Function : Independent/Modified Independent;Driving             Mobility Comments: using a cane due to knee pain and  buckling ADLs Comments: independent with adls and iadls     Extremity/Trunk Assessment   Upper Extremity Assessment Upper Extremity Assessment: Overall WFL for tasks assessed    Lower Extremity Assessment Lower Extremity Assessment: LLE deficits/detail;RLE deficits/detail RLE Deficits / Details: ROM WFL; MMT 5/5; reports also needs TKA RLE Sensation: WNL LLE Deficits / Details: Expected post op changes; ROM 0 to 60 derees; MMT 5/5 ankle, 2/5 knee , 3/5 hip LLE Sensation: WNL    Cervical / Trunk Assessment Cervical / Trunk Assessment: Normal  Communication   Communication Factors Affecting Communication: Hearing impaired    Cognition Arousal: Alert Behavior During Therapy: WFL for tasks assessed/performed   PT - Cognitive impairments: No apparent impairments                         Following commands: Intact       Cueing       General Comments General comments (skin integrity, edema, etc.): Educated on resting with leg straight; encouraged to do ankle pumps .  Pt has hx of orthostatic hypotension.  She had some c/o dizziness with transfers but VSS and allowed increased time.  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    Exercises Total Joint Exercises Ankle Circles/Pumps: AROM, Both, 10 reps, Supine   Assessment/Plan    PT Assessment Patient needs continued PT services  PT Problem List Decreased strength;Decreased mobility;Decreased range of motion;Decreased activity tolerance;Decreased balance;Decreased knowledge of use of DME;Pain       PT Treatment Interventions DME instruction;Therapeutic exercise;Stair training;Functional mobility training;Therapeutic activities;Patient/family education;Modalities;Balance training;Gait training    PT Goals (Current goals can be found in the Care Plan section)  Acute Rehab PT Goals Patient Stated Goal: return home PT Goal Formulation: With patient/family Time For Goal Achievement: 11/15/24 Potential to Achieve  Goals: Good    Frequency 7X/week     Co-evaluation               AM-PAC PT 6 Clicks Mobility  Outcome Measure Help needed turning from your back to your side while in a flat bed without using bedrails?: A Little Help needed moving from lying on your back to sitting on the side of a flat bed without using bedrails?: A Little Help needed moving to and from a bed to a chair (including a wheelchair)?: A Little Help needed standing up from a chair using your arms (e.g., wheelchair or bedside chair)?: A Little Help needed to walk in hospital room?: Total Help needed climbing 3-5 steps with a railing? : Total 6 Click Score: 14    End of Session Equipment Utilized During Treatment: Gait belt Activity Tolerance: Patient tolerated treatment well Patient left: in bed;with call bell/phone within reach;with bed alarm set Nurse Communication: Mobility status PT Visit Diagnosis: Other abnormalities of gait and mobility (R26.89);Muscle weakness (generalized) (M62.81)    Time: 8561-8490 PT Time Calculation (min) (ACUTE ONLY): 31 min   Charges:   PT Evaluation $PT Eval Low Complexity: 1 Low PT Treatments $Gait Training: 8-22 mins PT General Charges $$ ACUTE PT VISIT: 1 Visit         Malcome Ambrocio, PT Acute  Rehab Services [facility] [phone]   Benjiman VEAR Mulberry 11/01/2024, 3:20 PM

## 2024-11-01 NOTE — Anesthesia Procedure Notes (Signed)
 Spinal  Patient location during procedure: OR Start time: 11/01/2024 7:34 AM End time: 11/01/2024 7:41 AM Reason for block: surgical anesthesia Staffing Performed: anesthesiologist  Anesthesiologist: Jefm Garnette LABOR, MD Performed by: Jefm Garnette LABOR, MD Authorized by: Jefm Garnette LABOR, MD   Preanesthetic Checklist Completed: patient identified, IV checked, risks and benefits discussed, surgical consent, monitors and equipment checked, pre-op evaluation and timeout performed Spinal Block Patient position: sitting Prep: DuraPrep and site prepped and draped Patient monitoring: heart rate, cardiac monitor, continuous pulse ox and blood pressure Approach: midline Location: L3-4 Injection technique: single-shot Needle Needle type: Pencan  Needle gauge: 24 G Needle length: 10 cm Needle insertion depth: 8 cm Assessment Sensory level: T4 Events: CSF return Additional Notes  2 Attempt (s). Pt tolerated procedure well.

## 2024-11-02 ENCOUNTER — Encounter (HOSPITAL_COMMUNITY): Payer: Self-pay | Admitting: Orthopedic Surgery

## 2024-11-02 LAB — BASIC METABOLIC PANEL WITH GFR
Anion gap: 9 (ref 5–15)
BUN: 13 mg/dL (ref 8–23)
CO2: 26 mmol/L (ref 22–32)
Calcium: 8.9 mg/dL (ref 8.9–10.3)
Chloride: 98 mmol/L (ref 98–111)
Creatinine, Ser: 0.91 mg/dL (ref 0.44–1.00)
GFR, Estimated: >60 mL/min (ref >60)
Glucose, Bld: 99 mg/dL (ref 70–99)
Potassium: 3.5 mmol/L (ref 3.5–5.1)
Sodium: 133 mmol/L — ABNORMAL LOW (ref 135–145)

## 2024-11-02 LAB — CBC
HCT: 35.8 % — ABNORMAL LOW (ref 36.0–46.0)
Hemoglobin: 11.9 g/dL — ABNORMAL LOW (ref 12.0–15.0)
MCH: 32.9 pg (ref 26.0–34.0)
MCHC: 33.2 g/dL (ref 30.0–36.0)
MCV: 98.9 fL (ref 80.0–100.0)
Platelets: 160 K/uL (ref 150–400)
RBC: 3.62 MIL/uL — ABNORMAL LOW (ref 3.87–5.11)
RDW: 13.5 % (ref 11.5–15.5)
WBC: 6.8 K/uL (ref 4.0–10.5)
nRBC: 0 % (ref 0.0–0.2)

## 2024-11-02 MED ORDER — PENTOSAN POLYSULFATE SODIUM 100 MG PO CAPS
200.0000 mg | ORAL_CAPSULE | Freq: Two times a day (BID) | ORAL | Status: DC
  Administered 2024-11-02 – 2024-11-07 (×10): 200 mg via ORAL
  Filled 2024-11-02 (×11): qty 2

## 2024-11-02 MED ORDER — PENTOSAN POLYSULFATE SODIUM 100 MG PO CAPS
100.0000 mg | ORAL_CAPSULE | Freq: Three times a day (TID) | ORAL | Status: DC
  Filled 2024-11-02 (×2): qty 1

## 2024-11-02 MED ORDER — NON FORMULARY: Freq: Two times a day (BID) | Status: DC

## 2024-11-02 MED ORDER — AMITRIPTYLINE HCL 50 MG PO TABS
100.0000 mg | ORAL_TABLET | Freq: Every day | ORAL | Status: DC
  Administered 2024-11-02 – 2024-11-06 (×5): 100 mg via ORAL
  Filled 2024-11-02 (×5): qty 2

## 2024-11-02 MED ORDER — BUDESONIDE-FORMOTEROL FUMARATE 80-4.5 MCG/ACT IN AERO
2.0000 | INHALATION_SPRAY | Freq: Two times a day (BID) | RESPIRATORY_TRACT | Status: DC
  Administered 2024-11-03 – 2024-11-07 (×9): 2 via RESPIRATORY_TRACT

## 2024-11-02 MED ORDER — AMITRIPTYLINE HCL 50 MG PO TABS: 200.0000 mg | ORAL_TABLET | Freq: Two times a day (BID) | ORAL | Status: DC

## 2024-11-02 NOTE — Discharge Summary (Incomplete Revision)
 Physician Discharge Summary  Patient ID: Kelly Thornton MRN: [id number] DOB/AGE: 08-08-1961 63 y.o.  Admit date: 11/01/2024 Discharge date: 11/05/2024  Admission Diagnoses:  Primary osteoarthritis of left knee  Discharge Diagnoses:  Principal Problem:   Primary osteoarthritis of left knee   Past Medical History:  Diagnosis Date   Abnormal uterine bleeding    Anxiety    Arthritis    Cataract    Chronic interstitial cystitis    Colitis, ischemic    Cough variant asthma 04/02/2018   FENO 04/02/2018  =   11 on advair 250 one bid - Spirometry 04/02/2018  FEV1 2.39 (84%)  Ratio 87 with truncation of peak flow p advair prior  - 04/02/2018  After extensive coaching inhaler device  effectiveness =    75% from a baseline of 50% so try symbicort  80  1-2 bid     Cushing disease (HCC)    Depression    Dermatitis of both ear canals    Diabetes mellitus without complication (HCC)    borderline   Dyspnea    Dysrhythmia    Endometriosis    Fibromyalgia    GERD (gastroesophageal reflux disease)    Gout    Hearing loss    Hyperlipidemia    Hypertension    Hypothyroidism    Irritable bowel syndrome (IBS)    Meniere disease    Bilateral   Morbid obesity (HCC)    Nonsustained ventricular tachycardia (HCC)    Osteopenia    Pneumonia    PONV (postoperative nausea and vomiting)    PVC (premature ventricular contraction)    Seasonal allergies    Sjogren's disease    Tachycardia    Tinnitus    Wears hearing aid in both ears     Surgeries: Procedure(s): ARTHROPLASTY, KNEE, TOTAL on 11/01/2024   Consultants (if any):   Discharged Condition: Improved  Hospital Course: Kelly Thornton is an 63 y.o. female who was admitted 11/01/2024 with a diagnosis of Primary osteoarthritis of left knee and went to the operating room on 11/01/2024 and underwent the above named procedures.  Slow to mobilize with physical therapy due to orthostatic hypotension, which improved with guidance from hospitalist  team and pain control.  She was given perioperative antibiotics:  Anti-infectives (From admission, onward)    Start     Dose/Rate Route Frequency Ordered Stop   11/01/24 2000  ciprofloxacin  (CIPRO ) IVPB 400 mg        400 mg 200 mL/hr over 60 Minutes Intravenous  Once 11/01/24 1242 11/01/24 2035   11/01/24 1800  vancomycin (VANCOCIN) IVPB 1000 mg/200 mL premix        1,000 mg 200 mL/hr over 60 Minutes Intravenous Every 12 hours 11/01/24 1224 11/01/24 1809   11/01/24 0600  vancomycin (VANCOREADY) IVPB 1500 mg/300 mL        1,500 mg 150 mL/hr over 120 Minutes Intravenous On call to O.R. 11/01/24 0529 11/01/24 0838   11/01/24 0530  ciprofloxacin  (CIPRO ) IVPB 400 mg        400 mg 200 mL/hr over 60 Minutes Intravenous  Once 11/01/24 0529 11/01/24 0742     .  She was given sequential compression devices, early ambulation, and aspirin for DVT prophylaxis.  She benefited maximally from the hospital stay and there were no complications.    Recent vital signs:  Vitals:   11/02/24 0224 11/02/24 0555  BP: (!) 141/66 (!) 159/75  Pulse: 89 91  Resp: 16 18  Temp: 97.8 F (36.6 C)  97.9 F (36.6 C)  SpO2: 100% 99%    Recent laboratory studies:  Lab Results  Component Value Date   HGB 11.9 (L) 11/02/2024   HGB 13.5 11/01/2024   HGB 13.9 10/21/2024   Lab Results  Component Value Date   WBC 6.8 11/02/2024   PLT 160 11/02/2024   No results found for: INR Lab Results  Component Value Date   NA 133 (L) 11/02/2024   K 3.5 11/02/2024   CL 98 11/02/2024   CO2 26 11/02/2024   BUN 13 11/02/2024   CREATININE 0.91 11/02/2024   GLUCOSE 99 11/02/2024    Discharge Medications:   Allergies as of 11/02/2024       Reactions   Isopropyl Alcohol Rash   Mineral Oil Rash   Monosodium Glutamate Nausea And Vomiting   Nickel Rash   Pneumovax [pneumococcal Polysaccharide Vaccine] Rash   Procaine Shortness Of Breath, Rash   Rubbing Alcohol [alcohol] Other (See Comments)   burns skin    Shellfish Allergy  Anaphylaxis   Silicone Dioxide [silica] Rash, Other (See Comments)   Skin burning   Ace Inhibitors Swelling   Lips swell   Avelox [moxifloxacin Hcl In Nacl] Other (See Comments)   Gastritis   Cephalosporins    Other reaction(s): Unknown   Chocolate Other (See Comments)   Sneezing   Clofexamide    Other reaction(s): Unknown   Cocoa Other (See Comments)   Sneezing   Codeine Nausea Only, Other (See Comments)   GI upset   Darvon [propoxyphene] Other (See Comments)   GI Upset   Fluorescein-benoxinate Other (See Comments)   Eye irritation   Hydrocodone-acetaminophen  Other (See Comments)   GI upset *Vicodin    Lisinopril Swelling   Tongue and lips became swollen   Silicone    Other reaction(s): Unknown   Simvastatin Other (See Comments)   Leg cramps   Tape Other (See Comments)   Redness Other reaction(s): Unknown   Tetracyclines & Related Other (See Comments)   GI upset   Betadine [povidone Iodine] Rash   Red rash with blisters    Bextra [valdecoxib] Rash   Biaxin [clarithromycin] Rash   Ceclor [cefaclor] Rash   Doxycycline Rash   Erythromycin Rash   Hibiclens  [chlorhexidine  Gluconate] Rash   Keflex [cephalexin] Rash   Penicillins Rash   Did it involve swelling of the face/tongue/throat, SOB, or low BP? Yes Did it involve sudden or severe rash/hives, skin peeling, or any reaction on the inside of your mouth or nose? No Did you need to seek medical attention at a hospital or doctor's office? No When did it last happen? I was a kid   If all above answers are NO, may proceed with cephalosporin use.   Povidone-iodine Rash      Thimerosal (thiomersal) Rash        Medication List     TAKE these medications    albuterol  (2.5 MG/3ML) 0.083% nebulizer solution Commonly known as: PROVENTIL  Take 2.5 mg by nebulization every 6 (six) hours as needed for wheezing or shortness of breath.   albuterol  108 (90 Base) MCG/ACT inhaler Commonly known as:  VENTOLIN  HFA Inhale 2 puffs into the lungs every 6 (six) hours as needed for wheezing or shortness of breath.   allopurinol  300 MG tablet Commonly known as: ZYLOPRIM  Take 300 mg by mouth at bedtime.   amitriptyline  100 MG tablet Commonly known as: ELAVIL  Take 100 mg by mouth at bedtime.   aspirin EC 81 MG tablet Take 1  tablet (81 mg total) by mouth 2 (two) times daily for 28 days. Swallow whole.   Avenova 0.01 % Soln Place 1 application into both eyes 2 (two) times daily.   BIOTIN PO Take 2,500 mcg by mouth at bedtime.   budesonide -formoterol  80-4.5 MCG/ACT inhaler Commonly known as: Symbicort  Take 2 puffs first thing in am and then another 2 puffs about 12 hours later. What changed:  how much to take how to take this when to take this additional instructions   calcium  carbonate 1500 (600 Ca) MG Tabs tablet Commonly known as: OSCAL Take 600 mg of elemental calcium  by mouth at bedtime.   carboxymethylcellulose 0.5 % Soln Commonly known as: REFRESH PLUS Place 1 drop into both eyes 3 (three) times daily as needed (dry eyes).   carvedilol  12.5 MG tablet Commonly known as: COREG  TAKE 1 TABLET BY MOUTH TWICE  DAILY   cetirizine 10 MG tablet Commonly known as: ZYRTEC Take 10 mg by mouth 2 (two) times daily.   clotrimazole-betamethasone cream Commonly known as: LOTRISONE Apply 1 Application topically 2 (two) times daily as needed (skin irritation.).   cromolyn 5.2 MG/ACT nasal spray Commonly known as: NASALCROM Place 1 spray into both nostrils 2 (two) times daily as needed for allergies.   desvenlafaxine 100 MG 24 hr tablet Commonly known as: PRISTIQ Take 100 mg by mouth in the morning.   docusate sodium  100 MG capsule Commonly known as: COLACE Take 100 mg by mouth 2 (two) times daily.   EPINEPHrine  0.3 mg/0.3 mL Soaj injection Commonly known as: EPI-PEN Inject 0.3 mg into the muscle as needed for anaphylaxis (from seafood).   fluocinonide 0.05 % external  solution Commonly known as: LIDEX Apply 1 Application topically daily.   fluticasone  50 MCG/ACT nasal spray Commonly known as: FLONASE Place 1 spray into both nostrils in the morning and at bedtime.   FT Vitamin K2 100 MCG Caps Generic drug: Menaquinone-7 Take 100 mcg by mouth at bedtime.   GAS-X ULTRA STRENGTH PO Take 250 mg by mouth in the morning and at bedtime.   levothyroxine  175 MCG tablet Commonly known as: SYNTHROID  Take 0.125 mcg by mouth daily before breakfast.   magnesium oxide 400 (240 Mg) MG tablet Commonly known as: MAG-OX Take 400 mg by mouth in the morning.   meloxicam 15 MG tablet Commonly known as: MOBIC Take 1 tablet (15 mg total) by mouth daily for 15 days.   methocarbamol 500 MG tablet Commonly known as: ROBAXIN Take 1 tablet (500 mg total) by mouth every 8 (eight) hours as needed for up to 10 days for muscle spasms.   mexiletine 150 MG capsule Commonly known as: MEXITIL  TAKE 1 CAPSULE BY MOUTH TWICE  DAILY   mupirocin ointment 2 % Commonly known as: BACTROBAN Place 1 Application into the nose 2 (two) times daily for 60 doses. Use as directed 2 times daily for 5 days every other week for 6 weeks.   NASAL SALINE NA Place 2 sprays into both nostrils daily.   nystatin -triamcinolone  ointment Commonly known as: MYCOLOG Apply 1 Application topically 2 (two) times daily. What changed:  when to take this reasons to take this   omeprazole 40 MG capsule Commonly known as: PRILOSEC Take 1 capsule (40 mg total) by mouth daily for 21 days.   ondansetron  4 MG tablet Commonly known as: Zofran  Take 1 tablet (4 mg total) by mouth every 8 (eight) hours as needed for up to 14 days for nausea or vomiting.  oxyCODONE  5 MG immediate release tablet Commonly known as: Roxicodone  Take 1 tablet (5 mg total) by mouth every 4 (four) hours as needed for up to 7 days for severe pain (pain score 7-10) or moderate pain (pain score 4-6).   Ozempic (2 MG/DOSE) 8  MG/3ML Sopn Generic drug: Semaglutide (2 MG/DOSE) Inject 2 mg into the skin every Sunday.   Pentosan Polysulfate Sodium 200 MG Cpdr Take 200 mg by mouth 2 (two) times daily.   polyethylene glycol 17 g packet Commonly known as: MiraLax Take 17 g by mouth daily.   rosuvastatin  5 MG tablet Commonly known as: CRESTOR  Take 5 mg by mouth at bedtime.   triamterene -hydrochlorothiazide  37.5-25 MG tablet Commonly known as: MAXZIDE -25 Take 1 tablet by mouth in the morning.   VITAMIN D3 PO Take 2,000 Units by mouth in the morning.        Diagnostic Studies: DG Knee Left Port Result Date: 11/01/2024 CLINICAL DATA:  Postop. EXAM: PORTABLE LEFT KNEE - 1-2 VIEW COMPARISON:  None Available. FINDINGS: Left knee arthroplasty in expected alignment. No periprosthetic lucency or fracture. There has been patellar resurfacing. Recent postsurgical change includes air and edema in the soft tissues and joint space. IMPRESSION: Left knee arthroplasty without immediate postoperative complication. Electronically Signed   By: Andrea Gasman M.D.   On: 11/01/2024 10:57    Disposition: Discharge disposition: 01-Home or Self Care       Discharge Instructions     Call MD / Call 911   Complete by: As directed    If you experience chest pain or shortness of breath, CALL 911 and be transported to the hospital emergency room.  If you develope a fever above 101 F, pus (white drainage) or increased drainage or redness at the wound, or calf pain, call your surgeon's office.   Constipation Prevention   Complete by: As directed    Drink plenty of fluids.  Prune juice may be helpful.  You may use a stool softener, such as Colace (over the counter) 100 mg twice a day.  Use MiraLax (over the counter) for constipation as needed.   Diet - low sodium heart healthy   Complete by: As directed    Increase activity slowly as tolerated   Complete by: As directed    Post-operative opioid taper instructions:   Complete  by: As directed    POST-OPERATIVE OPIOID TAPER INSTRUCTIONS: It is important to wean off of your opioid medication as soon as possible. If you do not need pain medication after your surgery it is ok to stop day one. Opioids include: Codeine, Hydrocodone(Norco, Vicodin), Oxycodone (Percocet, oxycontin ) and hydromorphone  amongst others.  Long term and even short term use of opiods can cause: Increased pain response Dependence Constipation Depression Respiratory depression And more.  Withdrawal symptoms can include Flu like symptoms Nausea, vomiting And more Techniques to manage these symptoms Hydrate well Eat regular healthy meals Stay active Use relaxation techniques(deep breathing, meditating, yoga) Do Not substitute Alcohol to help with tapering If you have been on opioids for less than two weeks and do not have pain than it is ok to stop all together.  Plan to wean off of opioids This plan should start within one week post op of your joint replacement. Maintain the same interval or time between taking each dose and first decrease the dose.  Cut the total daily intake of opioids by one tablet each day Next start to increase the time between doses. The last dose that should be  eliminated is the evening dose.           Follow-up Information     Edna Toribio LABOR, MD Follow up in 2 week(s).   Specialty: Orthopedic Surgery Contact information: [address] [place] [address] [phone]                    Discharge Instructions      INSTRUCTIONS AFTER JOINT REPLACEMENT   Remove items at home which could result in a fall. This includes throw rugs or furniture in walking pathways ICE to the affected joint every three hours while awake for 30 minutes at a time, for at least the first 3-5 days, and then as needed for pain and swelling.  Continue to use ice for pain and swelling. You may notice swelling that will progress down to the foot and  ankle.  This is normal after surgery.  Elevate your leg when you are not up walking on it.   Continue to use the breathing machine you got in the hospital (incentive spirometer) which will help keep your temperature down.  It is common for your temperature to cycle up and down following surgery, especially at night when you are not up moving around and exerting yourself.  The breathing machine keeps your lungs expanded and your temperature down.  DIET:  As you were doing prior to hospitalization, we recommend a well-balanced diet.  DRESSING / WOUND CARE / SHOWERING:  Keep the surgical dressing until follow up.  The dressing is NOT waterproof, so this needs to be covered up when showering.  After 3-5 days, you may change the bandage as needed if it becomes soiled.  IF THE DRESSING FALLS OFF or the wound gets wet inside, change the dressing with sterile gauze.  Please use good hand washing techniques before changing the dressing.  Do not use any lotions or creams on the incision until instructed by your surgeon.    ACTIVITY  Increase activity slowly as tolerated, but follow the weight bearing instructions below.   No driving for 6 weeks or until further direction given by your physician.  You cannot drive while taking narcotics.  No lifting or carrying greater than 10 lbs. until further directed by your surgeon. Avoid periods of inactivity such as sitting longer than an hour when not asleep. This helps prevent blood clots.  You may return to work once you are authorized by your doctor.   WEIGHT BEARING: Weight bearing as tolerated with assist device (walker, cane, etc) as directed, use it as long as suggested by your surgeon or therapist, typically at least 4-6 weeks.  EXERCISES  Results after joint replacement surgery are often greatly improved when you follow the exercise, range of motion and muscle strengthening exercises prescribed by your doctor. Safety measures are also important to protect  the joint from further injury. Any time any of these exercises cause you to have increased pain or swelling, decrease what you are doing until you are comfortable again and then slowly increase them. If you have problems or questions, call your caregiver or physical therapist for advice.   Rehabilitation is important following a joint replacement. After just a few days of immobilization, the muscles of the leg can become weakened and shrink (atrophy).  These exercises are designed to build up the tone and strength of the thigh and leg muscles and to improve motion. Often times heat used for twenty to thirty minutes before working out will  loosen up your tissues and help with improving the range of motion but do not use heat for the first two weeks following surgery (sometimes heat can increase post-operative swelling).   These exercises can be done on a training (exercise) mat, on the floor, on a table or on a bed. Use whatever works the best and is most comfortable for you.    Use music or television while you are exercising so that the exercises are a pleasant break in your day. This will make your life better with the exercises acting as a break in your routine that you can look forward to.   Perform all exercises about fifteen times, three times per day or as directed.  You should exercise both the operative leg and the other leg as well.  Exercises include:   Quad Sets - Tighten up the muscle on the front of the thigh (Quad) and hold for 5-10 seconds.   Straight Leg Raises - With your knee straight (if you were given a brace, keep it on), lift the leg to 60 degrees, hold for 3 seconds, and slowly lower the leg.  Perform this exercise against resistance later as your leg gets stronger.  Leg Slides: Lying on your back, slowly slide your foot toward your buttocks, bending your knee up off the floor (only go as far as is comfortable). Then slowly slide your foot back down until your leg is flat on the  floor again.  Angel Wings: Lying on your back spread your legs to the side as far apart as you can without causing discomfort.  Hamstring Strength:  Lying on your back, push your heel against the floor with your leg straight by tightening up the muscles of your buttocks.  Repeat, but this time bend your knee to a comfortable angle, and push your heel against the floor.  You may put a pillow under the heel to make it more comfortable if necessary.   A rehabilitation program following joint replacement surgery can speed recovery and prevent re-injury in the future due to weakened muscles. Contact your doctor or a physical therapist for more information on knee rehabilitation.   CONSTIPATION:  Constipation is defined medically as fewer than three stools per week and severe constipation as less than one stool per week.  Even if you have a regular bowel pattern at home, your normal regimen is likely to be disrupted due to multiple reasons following surgery.  Combination of anesthesia, postoperative narcotics, change in appetite and fluid intake all can affect your bowels.   YOU MUST use at least one of the following options; they are listed in order of increasing strength to get the job done.  They are all available over the counter, and you may need to use some, POSSIBLY even all of these options:    Drink plenty of fluids (prune juice may be helpful) and high fiber foods Colace 100 mg by mouth twice a day  Senokot for constipation as directed and as needed Dulcolax (bisacodyl), take with full glass of water   Miralax (polyethylene glycol) once or twice a day as needed.  If you have tried all these things and are unable to have a bowel movement in the first 3-4 days after surgery call either your surgeon or your primary doctor.    If you experience loose stools or diarrhea, hold the medications until you stool forms back up.  If your symptoms do not get better within 1 week or if they get worse,  check  with your doctor.  If you experience the worst abdominal pain ever or develop nausea or vomiting, please contact the office immediately for further recommendations for treatment.  ITCHING:  If you experience itching with your medications, try taking only a single pain pill, or even half a pain pill at a time.  You can also use Benadryl  over the counter for itching or also to help with sleep.   TED HOSE STOCKINGS:  Use stockings on both legs until for at least 2 weeks or as directed by physician office. They may be removed at night for sleeping.  MEDICATIONS:  See your medication summary on the "After Visit Summary" that nursing will review with you.  You may have some home medications which will be placed on hold until you complete the course of blood thinner medication.  It is important for you to complete the blood thinner medication as prescribed.  Blood clot prevention (DVT Prophylaxis): After surgery you are at an increased risk for a blood clot.  You were prescribed a blood thinner, Aspirin 81mg , to be taken twice daily for a total of 4 weeks from surgery to help reduce your risk of getting a blood clot.  Signs of a pulmonary embolus (blood clot in the lungs) include sudden short of breath, feeling lightheaded or dizzy, chest pain with a deep breath, rapid pulse rapid breathing.  Signs of a blood clot in your arms or legs include new unexplained swelling and cramping, warm, red or darkened skin around the painful area.  Please call the office or 911 right away if these signs or symptoms develop.  PRECAUTIONS:   If you experience chest pain or shortness of breath - call 911 immediately for transfer to the hospital emergency department.   If you develop a fever greater that 101 F, purulent drainage from wound, increased redness or drainage from wound, foul odor from the wound/dressing, or calf pain - CONTACT YOUR SURGEON.                                                   FOLLOW-UP APPOINTMENTS:   If you do not already have a post-op appointment, please call the office for an appointment to be seen by your surgeon.  Guidelines for how soon to be seen are listed in your "After Visit Summary", but are typically between 2-3 weeks after surgery.  If you have a specialized bandage, you may be told to follow up 1 week after surgery.  OTHER INSTRUCTIONS:  Knee Replacement:  Do not place pillow under knee, focus on keeping the knee straight while resting.  Place foam block, curve side up under heel at all times except when walking.  DO NOT modify, tear, cut, or change the foam block in any way.  POST-OPERATIVE OPIOID TAPER INSTRUCTIONS: It is important to wean off of your opioid medication as soon as possible. If you do not need pain medication after your surgery it is ok to stop day one. Opioids include: Codeine, Hydrocodone(Norco, Vicodin), Oxycodone (Percocet, oxycontin ) and hydromorphone  amongst others.  Long term and even short term use of opiods can cause: Increased pain response Dependence Constipation Depression Respiratory depression And more.  Withdrawal symptoms can include Flu like symptoms Nausea, vomiting And more Techniques to manage these symptoms Hydrate well Eat regular healthy meals Stay active Use relaxation techniques(deep breathing,  meditating, yoga) Do Not substitute Alcohol to help with tapering If you have been on opioids for less than two weeks and do not have pain than it is ok to stop all together.  Plan to wean off of opioids This plan should start within one week post op of your joint replacement. Maintain the same interval or time between taking each dose and first decrease the dose.  Cut the total daily intake of opioids by one tablet each day Next start to increase the time between doses. The last dose that should be eliminated is the evening dose.   MAKE SURE YOU:  Understand these instructions.  Get help right away if you are not doing well or get  worse.    Thank you for letting us  be a part of your medical care team.  It is a privilege we respect greatly.  We hope these instructions will help you stay on track for a fast and full recovery!            Signed: Bernarda CHRISTELLA Mclean 11/05/2024, 7:48 AM

## 2024-11-02 NOTE — TOC Transition Note (Signed)
 Transition of Care [facility] - Discharge Note   Patient Details  Name: Kelly Thornton MRN: [id number] Date of Birth: 1961/10/04  Transition of Care [facility]) CM/SW Contact:  Alfonse JONELLE Rex, RN Phone Number: 11/02/2024, 1:03 PM   Clinical Narrative:   Met with patient at bedside to review dc therapy and home equipment needs, pt confirmed OPPT, needs RW, RW delivered to bedside by [employer]. No INPT CM needs.     Final next level of care: OP Rehab Barriers to Discharge: No Barriers Identified   Patient Goals and CMS Choice Patient states their goals for this hospitalization and ongoing recovery are:: return home          Discharge Placement                       Discharge Plan and Services Additional resources added to the After Visit Summary for                                       Social Drivers of Health (SDOH) Interventions SDOH Screenings   Food Insecurity: No Food Insecurity (11/01/2024)  Housing: Low Risk  (11/01/2024)  Transportation Needs: No Transportation Needs (11/01/2024)  Utilities: Not At Risk (11/01/2024)  Depression (PHQ2-9): Low Risk  (03/03/2024)  Social Connections: Unknown (04/30/2023)   Received from [facility]  Tobacco Use: Low Risk  (11/01/2024)     Readmission Risk Interventions     No data to display

## 2024-11-02 NOTE — Progress Notes (Signed)
 Physical Therapy Treatment Patient Details Name: Kelly Thornton MRN: [id number] DOB: August 14, 1961 Today's Date: 11/02/2024   History of Present Illness Pt is 63 yo female s/p L TKA on 11/01/24.  Pt with hx including but not limited to adrenal mass, Raynaud's disease, orthostatic BP, hearing loss bil (R worse than L), obesity, DM2, arthritis, anxiety, fibromyalgia, HLD, HTN, reports Meniere's    PT Comments  The patient continues to report dizziness when mobilizing, Assisted to [facility], then reports too dizzy to ambulate so able to step back to  bed using RW.  Patient also unable to void on the /bSC. RN aware. Patient has not met goals  for DC today.    If plan is discharge home, recommend the following: A little help with walking and/or transfers;A little help with bathing/dressing/bathroom;Assistance with cooking/housework;Help with stairs or ramp for entrance   Can travel by private vehicle        Equipment Recommendations  Rolling walker (2 wheels)    Recommendations for Other Services       Precautions / Restrictions Precautions Precautions: Fall;Knee Precaution/Restrictions Comments: dizziness, body titubations when dizzy Restrictions LLE Weight Bearing Per Provider Order: Weight bearing as tolerated     Mobility  Bed Mobility Overal bed mobility: Needs Assistance Bed Mobility: Sit to Supine     Supine to sit: Sit to supine: Min assist   General bed mobility comments: assist LLE onto bed    Transfers Overall transfer level: Needs assistance Equipment used: Rolling walker (2 wheels) Transfers: Sit to/from Stand, Bed to chair/wheelchair/BSC Sit to Stand: Mod assist, +2 physical assistance, +2 safety/equipment   Step pivot transfers: Min assist, +2 physical assistance, +2 safety/equipment       General transfer comment: cues for hand placement and L LE management(has a lift chair at home) Assist to power upfrom lower recliner, Step tp BSC, shen min A to stand at RW  from [facility] then step  backwards to bed. Noted trunk swaying at times    Ambulation/Gait Ambulation/Gait assistance: Min assist, +2 safety/equipment Gait Distance (Feet): 6 Feet (then 5' then 10') Assistive device: Rolling walker (2 wheels) Gait Pattern/deviations: Step-to pattern, Decreased weight shift to left, Shuffle, Antalgic Gait velocity: decreased     General Gait Details: noted trunk wobble when first stands, stops several times to stand and gain steadiness   Stairs             Wheelchair Mobility     Tilt Bed    Modified Rankin (Stroke Patients Only)       Balance Overall balance assessment: Needs assistance Sitting-balance support: No upper extremity supported, Feet supported Sitting balance-Leahy Scale: Good     Standing balance support: Reliant on assistive device for balance, During functional activity, Bilateral upper extremity supported Standing balance-Leahy Scale: Poor                              Communication Communication Factors Affecting Communication: Hearing impaired  Cognition Arousal: Alert Behavior During Therapy: WFL for tasks assessed/performed, Anxious   PT - Cognitive impairments: No apparent impairments                         Following commands: Intact      Cueing    Exercises Total Joint Exercises Ankle Circles/Pumps: AROM, Both, 10 reps Quad Sets: AROM, Both, 10 reps Heel Slides: AAROM, Left, 5 reps Hip ABduction/ADduction: AAROM, Left, 5 reps  Straight Leg Raises: AAROM, Left, 10 reps Goniometric ROM: 5-45 seated    General Comments        Pertinent Vitals/Pain Pain Assessment Pain Score: 8  Pain Location: L knee Pain Descriptors / Indicators: Aching, Constant Pain Intervention(s): Monitored during session, Premedicated before session, Ice applied    Home Living                          Prior Function            PT Goals (current goals can now be found in the care plan  section) Progress towards PT goals: Progressing toward goals    Frequency    7X/week      PT Plan      Co-evaluation              AM-PAC PT 6 Clicks Mobility   Outcome Measure  Help needed turning from your back to your side while in a flat bed without using bedrails?: A Little Help needed moving from lying on your back to sitting on the side of a flat bed without using bedrails?: A Little Help needed moving to and from a bed to a chair (including a wheelchair)?: A Lot Help needed standing up from a chair using your arms (e.g., wheelchair or bedside chair)?: A Lot Help needed to walk in hospital room?: A Lot Help needed climbing 3-5 steps with a railing? : Total 6 Click Score: 13    End of Session Equipment Utilized During Treatment: Gait belt Activity Tolerance: Treatment limited secondary to medical complications (Comment) Patient left: in bed;with call bell/phone within reach;with bed alarm set Nurse Communication: Mobility status PT Visit Diagnosis: Other abnormalities of gait and mobility (R26.89);Muscle weakness (generalized) (M62.81)     Time: 8646-8566 PT Time Calculation (min) (ACUTE ONLY): 40 min  Charges:     $Therapeutic Exercise: 8-22 mins $Therapeutic Activity: 8-22 mins $Self Care/Home Management: 8-22 PT General Charges $$ ACUTE PT VISIT: 1 Visit                    Darice Potters PT Acute Rehabilitation Services Office [phone]   Potters Darice Norris 11/02/2024, 3:16 PM

## 2024-11-02 NOTE — Progress Notes (Signed)
 Pt assessed and assisted with with 2 people to the bsc to urinate. Pt able to urinate. See flow sheet for documentation. Pt c/o of pain and is seeking pain meds at this time.

## 2024-11-02 NOTE — Care Management Obs Status (Signed)
 MEDICARE OBSERVATION STATUS NOTIFICATION   Patient Details  Name: CHENOAH MCNALLY MRN: [id number] Date of Birth: [birth year]   Medicare Observation Status Notification Given:  Yes    Alfonse JONELLE Rex, RN 11/02/2024, 2:56 PM

## 2024-11-02 NOTE — Discharge Summary (Cosign Needed Addendum)
 Physician Discharge Summary  Patient ID: Kelly Thornton MRN: [id number] DOB/AGE: 65-14-1962 65 y.o.  Admit date: 11/01/2024 Discharge date: 11/07/2024  Admission Diagnoses:  Primary osteoarthritis of left knee  Discharge Diagnoses:  Principal Problem:   Primary osteoarthritis of left knee Active Problems:   Primary osteoarthritis of right knee   Past Medical History:  Diagnosis Date   Abnormal uterine bleeding    Anxiety    Arthritis    Cataract    Chronic interstitial cystitis    Colitis, ischemic    Cough variant asthma 04/02/2018   FENO 04/02/2018  =   11 on advair 250 one bid - Spirometry 04/02/2018  FEV1 2.39 (84%)  Ratio 87 with truncation of peak flow p advair prior  - 04/02/2018  After extensive coaching inhaler device  effectiveness =    75% from a baseline of 50% so try symbicort  80  1-2 bid     Cushing disease (HCC)    Depression    Dermatitis of both ear canals    Diabetes mellitus without complication (HCC)    borderline   Dyspnea    Dysrhythmia    Endometriosis    Fibromyalgia    GERD (gastroesophageal reflux disease)    Gout    Hearing loss    Hyperlipidemia    Hypertension    Hypothyroidism    Irritable bowel syndrome (IBS)    Meniere disease    Bilateral   Morbid obesity (HCC)    Nonsustained ventricular tachycardia (HCC)    Osteopenia    Pneumonia    PONV (postoperative nausea and vomiting)    PVC (premature ventricular contraction)    Seasonal allergies    Sjogren's disease    Tachycardia    Tinnitus    Wears hearing aid in both ears     Surgeries: Procedure(s): ARTHROPLASTY, KNEE, TOTAL on 11/01/2024   Consultants (if any): Treatment Team:  Cheryle Page, MD  Discharged Condition: Improved  Hospital Course: Kelly Thornton is an 63 y.o. female who was admitted 11/01/2024 with a diagnosis of Primary osteoarthritis of left knee and went to the operating room on 11/01/2024 and underwent the above named procedures.  Slow to mobilize with  physical therapy due to orthostatic hypotension, which improved with guidance from hospitalist team and pain control.  She was given perioperative antibiotics:  Anti-infectives (From admission, onward)    Start     Dose/Rate Route Frequency Ordered Stop   11/01/24 2000  ciprofloxacin  (CIPRO ) IVPB 400 mg        400 mg 200 mL/hr over 60 Minutes Intravenous  Once 11/01/24 1242 11/02/24 1305   11/01/24 1800  vancomycin (VANCOCIN) IVPB 1000 mg/200 mL premix        1,000 mg 200 mL/hr over 60 Minutes Intravenous Every 12 hours 11/01/24 1224 11/01/24 1809   11/01/24 0600  vancomycin (VANCOREADY) IVPB 1500 mg/300 mL        1,500 mg 150 mL/hr over 120 Minutes Intravenous On call to O.R. 11/01/24 0529 11/01/24 0838   11/01/24 0530  ciprofloxacin  (CIPRO ) IVPB 400 mg        400 mg 200 mL/hr over 60 Minutes Intravenous  Once 11/01/24 0529 11/01/24 0742     .  She was given sequential compression devices, early ambulation, and aspirin for DVT prophylaxis.  She benefited maximally from the hospital stay and there were no complications.    Recent vital signs:  Vitals:   11/06/24 2219 11/07/24 0539  BP: 137/60 135/63  Pulse:  87 91  Resp: 16 15  Temp: 100 F (37.8 C) 98 F (36.7 C)  SpO2: 100% 94%    Recent laboratory studies:  Lab Results  Component Value Date   HGB 10.5 (L) 11/04/2024   HGB 11.8 (L) 11/03/2024   HGB 11.9 (L) 11/02/2024   Lab Results  Component Value Date   WBC 7.1 11/04/2024   PLT 193 11/04/2024   No results found for: INR Lab Results  Component Value Date   NA 132 (L) 11/04/2024   K 3.8 11/04/2024   CL 97 (L) 11/04/2024   CO2 25 11/04/2024   BUN 8 11/04/2024   CREATININE 0.94 11/04/2024   GLUCOSE 135 (H) 11/04/2024    Discharge Medications:   Allergies as of 11/07/2024       Reactions   Isopropyl Alcohol Rash   Mineral Oil Rash   Monosodium Glutamate Nausea And Vomiting   Nickel Rash   Pneumovax [pneumococcal Polysaccharide Vaccine] Rash    Procaine Shortness Of Breath, Rash   Rubbing Alcohol [alcohol] Other (See Comments)   burns skin   Shellfish Allergy  Anaphylaxis   Silicone Dioxide [silica] Rash, Other (See Comments)   Skin burning   Ace Inhibitors Swelling   Lips swell   Avelox [moxifloxacin Hcl In Nacl] Other (See Comments)   Gastritis   Cephalosporins    Other reaction(s): Unknown   Chocolate Other (See Comments)   Sneezing   Clofexamide    Other reaction(s): Unknown   Cocoa Other (See Comments)   Sneezing   Codeine Nausea Only, Other (See Comments)   GI upset   Darvon [propoxyphene] Other (See Comments)   GI Upset   Fluorescein-benoxinate Other (See Comments)   Eye irritation   Hydrocodone-acetaminophen  Other (See Comments)   GI upset *Vicodin    Lisinopril Swelling   Tongue and lips became swollen   Silicone    Other reaction(s): Unknown   Simvastatin Other (See Comments)   Leg cramps   Tape Other (See Comments)   Redness Other reaction(s): Unknown   Tetracyclines & Related Other (See Comments)   GI upset   Betadine [povidone Iodine] Rash   Red rash with blisters    Bextra [valdecoxib] Rash   Biaxin [clarithromycin] Rash   Ceclor [cefaclor] Rash   Doxycycline Rash   Erythromycin Rash   Hibiclens  [chlorhexidine  Gluconate] Rash   Keflex [cephalexin] Rash   Penicillins Rash   Did it involve swelling of the face/tongue/throat, SOB, or low BP? Yes Did it involve sudden or severe rash/hives, skin peeling, or any reaction on the inside of your mouth or nose? No Did you need to seek medical attention at a hospital or doctor's office? No When did it last happen? I was a kid   If all above answers are NO, may proceed with cephalosporin use.   Povidone-iodine Rash      Thimerosal (thiomersal) Rash        Medication List     TAKE these medications    albuterol  (2.5 MG/3ML) 0.083% nebulizer solution Commonly known as: PROVENTIL  Take 2.5 mg by nebulization every 6 (six) hours as needed  for wheezing or shortness of breath.   albuterol  108 (90 Base) MCG/ACT inhaler Commonly known as: VENTOLIN  HFA Inhale 2 puffs into the lungs every 6 (six) hours as needed for wheezing or shortness of breath.   allopurinol  300 MG tablet Commonly known as: ZYLOPRIM  Take 300 mg by mouth at bedtime.   amitriptyline  100 MG tablet Commonly known as: ELAVIL   Take 100 mg by mouth at bedtime.   aspirin EC 81 MG tablet Take 1 tablet (81 mg total) by mouth 2 (two) times daily for 28 days. Swallow whole.   Avenova 0.01 % Soln Place 1 application into both eyes 2 (two) times daily.   BIOTIN PO Take 2,500 mcg by mouth at bedtime.   budesonide -formoterol  80-4.5 MCG/ACT inhaler Commonly known as: Symbicort  Take 2 puffs first thing in am and then another 2 puffs about 12 hours later. What changed:  how much to take how to take this when to take this additional instructions   calcium  carbonate 1500 (600 Ca) MG Tabs tablet Commonly known as: OSCAL Take 600 mg of elemental calcium  by mouth at bedtime.   carboxymethylcellulose 0.5 % Soln Commonly known as: REFRESH PLUS Place 1 drop into both eyes 3 (three) times daily as needed (dry eyes).   carvedilol  12.5 MG tablet Commonly known as: COREG  TAKE 1 TABLET BY MOUTH TWICE  DAILY   cetirizine 10 MG tablet Commonly known as: ZYRTEC Take 10 mg by mouth 2 (two) times daily.   clotrimazole-betamethasone cream Commonly known as: LOTRISONE Apply 1 Application topically 2 (two) times daily as needed (skin irritation.).   cromolyn 5.2 MG/ACT nasal spray Commonly known as: NASALCROM Place 1 spray into both nostrils 2 (two) times daily as needed for allergies.   desvenlafaxine 100 MG 24 hr tablet Commonly known as: PRISTIQ Take 100 mg by mouth in the morning.   docusate sodium  100 MG capsule Commonly known as: COLACE Take 100 mg by mouth 2 (two) times daily.   EPINEPHrine  0.3 mg/0.3 mL Soaj injection Commonly known as: EPI-PEN Inject  0.3 mg into the muscle as needed for anaphylaxis (from seafood).   fluocinonide 0.05 % external solution Commonly known as: LIDEX Apply 1 Application topically daily.   fluticasone  50 MCG/ACT nasal spray Commonly known as: FLONASE Place 1 spray into both nostrils in the morning and at bedtime.   FT Vitamin K2 100 MCG Caps Generic drug: Menaquinone-7 Take 100 mcg by mouth at bedtime.   GAS-X ULTRA STRENGTH PO Take 250 mg by mouth in the morning and at bedtime.   levothyroxine  175 MCG tablet Commonly known as: SYNTHROID  Take 0.125 mcg by mouth daily before breakfast.   magnesium oxide 400 (240 Mg) MG tablet Commonly known as: MAG-OX Take 400 mg by mouth in the morning.   meloxicam 15 MG tablet Commonly known as: MOBIC Take 1 tablet (15 mg total) by mouth daily for 15 days.   methocarbamol 500 MG tablet Commonly known as: ROBAXIN Take 1 tablet (500 mg total) by mouth every 8 (eight) hours as needed for up to 10 days for muscle spasms.   mexiletine 150 MG capsule Commonly known as: MEXITIL  TAKE 1 CAPSULE BY MOUTH TWICE  DAILY   mupirocin ointment 2 % Commonly known as: BACTROBAN Place 1 Application into the nose 2 (two) times daily for 60 doses. Use as directed 2 times daily for 5 days every other week for 6 weeks.   NASAL SALINE NA Place 2 sprays into both nostrils daily.   nystatin  100000 UNIT/ML suspension Commonly known as: MYCOSTATIN  Take 5 mLs (500,000 Units total) by mouth 4 (four) times daily. Until symptoms resolve   nystatin -triamcinolone  ointment Commonly known as: MYCOLOG Apply 1 Application topically 2 (two) times daily. What changed:  when to take this reasons to take this   omeprazole 40 MG capsule Commonly known as: PRILOSEC Take 1 capsule (40 mg total)  by mouth daily for 21 days.   ondansetron  4 MG tablet Commonly known as: Zofran  Take 1 tablet (4 mg total) by mouth every 8 (eight) hours as needed for up to 14 days for nausea or vomiting.    oxyCODONE  5 MG immediate release tablet Commonly known as: Roxicodone  Take 1 tablet (5 mg total) by mouth every 4 (four) hours as needed for up to 7 days for severe pain (pain score 7-10) or moderate pain (pain score 4-6).   Ozempic (2 MG/DOSE) 8 MG/3ML Sopn Generic drug: Semaglutide (2 MG/DOSE) Inject 2 mg into the skin every Sunday.   Pentosan Polysulfate Sodium 200 MG Cpdr Take 200 mg by mouth 2 (two) times daily.   rosuvastatin  5 MG tablet Commonly known as: CRESTOR  Take 5 mg by mouth at bedtime.   triamterene -hydrochlorothiazide  37.5-25 MG tablet Commonly known as: MAXZIDE -25 Take 1 tablet by mouth in the morning.   VITAMIN D3 PO Take 2,000 Units by mouth in the morning.        Diagnostic Studies: DG Knee Left Port Result Date: 11/01/2024 CLINICAL DATA:  Postop. EXAM: PORTABLE LEFT KNEE - 1-2 VIEW COMPARISON:  None Available. FINDINGS: Left knee arthroplasty in expected alignment. No periprosthetic lucency or fracture. There has been patellar resurfacing. Recent postsurgical change includes air and edema in the soft tissues and joint space. IMPRESSION: Left knee arthroplasty without immediate postoperative complication. Electronically Signed   By: Andrea Gasman M.D.   On: 11/01/2024 10:57    Disposition: Discharge disposition: 01-Home or Self Care       Discharge Instructions     Call MD / Call 911   Complete by: As directed    If you experience chest pain or shortness of breath, CALL 911 and be transported to the hospital emergency room.  If you develope a fever above 101 F, pus (white drainage) or increased drainage or redness at the wound, or calf pain, call your surgeon's office.   Call MD / Call 911   Complete by: As directed    If you experience chest pain or shortness of breath, CALL 911 and be transported to the hospital emergency room.  If you develope a fever above 101 F, pus (white drainage) or increased drainage or redness at the wound, or calf pain,  call your surgeon's office.   Constipation Prevention   Complete by: As directed    Drink plenty of fluids.  Prune juice may be helpful.  You may use a stool softener, such as Colace (over the counter) 100 mg twice a day.  Use MiraLax (over the counter) for constipation as needed.   Constipation Prevention   Complete by: As directed    Drink plenty of fluids.  Prune juice may be helpful.  You may use a stool softener, such as Colace (over the counter) 100 mg twice a day.  Use MiraLax (over the counter) for constipation as needed.   Diet - low sodium heart healthy   Complete by: As directed    Diet - low sodium heart healthy   Complete by: As directed    Do not put a pillow under the knee. Place it under the heel.   Complete by: As directed    Driving restrictions   Complete by: As directed    No driving for 2-4 weeks   Increase activity slowly as tolerated   Complete by: As directed    Increase activity slowly as tolerated   Complete by: As directed  Post-operative opioid taper instructions:   Complete by: As directed    POST-OPERATIVE OPIOID TAPER INSTRUCTIONS: It is important to wean off of your opioid medication as soon as possible. If you do not need pain medication after your surgery it is ok to stop day one. Opioids include: Codeine, Hydrocodone(Norco, Vicodin), Oxycodone (Percocet, oxycontin ) and hydromorphone  amongst others.  Long term and even short term use of opiods can cause: Increased pain response Dependence Constipation Depression Respiratory depression And more.  Withdrawal symptoms can include Flu like symptoms Nausea, vomiting And more Techniques to manage these symptoms Hydrate well Eat regular healthy meals Stay active Use relaxation techniques(deep breathing, meditating, yoga) Do Not substitute Alcohol to help with tapering If you have been on opioids for less than two weeks and do not have pain than it is ok to stop all together.  Plan to wean off of  opioids This plan should start within one week post op of your joint replacement. Maintain the same interval or time between taking each dose and first decrease the dose.  Cut the total daily intake of opioids by one tablet each day Next start to increase the time between doses. The last dose that should be eliminated is the evening dose.      Post-operative opioid taper instructions:   Complete by: As directed    POST-OPERATIVE OPIOID TAPER INSTRUCTIONS: It is important to wean off of your opioid medication as soon as possible. If you do not need pain medication after your surgery it is ok to stop day one. Opioids include: Codeine, Hydrocodone(Norco, Vicodin), Oxycodone (Percocet, oxycontin ) and hydromorphone  amongst others.  Long term and even short term use of opiods can cause: Increased pain response Dependence Constipation Depression Respiratory depression And more.  Withdrawal symptoms can include Flu like symptoms Nausea, vomiting And more Techniques to manage these symptoms Hydrate well Eat regular healthy meals Stay active Use relaxation techniques(deep breathing, meditating, yoga) Do Not substitute Alcohol to help with tapering If you have been on opioids for less than two weeks and do not have pain than it is ok to stop all together.  Plan to wean off of opioids This plan should start within one week post op of your joint replacement. Maintain the same interval or time between taking each dose and first decrease the dose.  Cut the total daily intake of opioids by one tablet each day Next start to increase the time between doses. The last dose that should be eliminated is the evening dose.      TED hose   Complete by: As directed    Use stockings (TED hose) for 2 weeks on both leg(s).  Then for 2 more weeks on the surgical leg.  You may remove them at night for sleeping.        Follow-up Information     Edna Toribio LABOR, MD Follow up in 2 week(s).    Specialty: Orthopedic Surgery Contact information: [address] [place] [address] [phone]                    Discharge Instructions      INSTRUCTIONS AFTER JOINT REPLACEMENT   Remove items at home which could result in a fall. This includes throw rugs or furniture in walking pathways ICE to the affected joint every three hours while awake for 30 minutes at a time, for at least the first 3-5 days, and then as needed for pain and swelling.  Continue to use ice  for pain and swelling. You may notice swelling that will progress down to the foot and ankle.  This is normal after surgery.  Elevate your leg when you are not up walking on it.   Continue to use the breathing machine you got in the hospital (incentive spirometer) which will help keep your temperature down.  It is common for your temperature to cycle up and down following surgery, especially at night when you are not up moving around and exerting yourself.  The breathing machine keeps your lungs expanded and your temperature down.  DIET:  As you were doing prior to hospitalization, we recommend a well-balanced diet.  DRESSING / WOUND CARE / SHOWERING:  Keep the surgical dressing until follow up.  The dressing is NOT waterproof, so this needs to be covered up when showering.  After 3-5 days, you may change the bandage as needed if it becomes soiled.  IF THE DRESSING FALLS OFF or the wound gets wet inside, change the dressing with sterile gauze.  Please use good hand washing techniques before changing the dressing.  Do not use any lotions or creams on the incision until instructed by your surgeon.    ACTIVITY  Increase activity slowly as tolerated, but follow the weight bearing instructions below.   No driving for 6 weeks or until further direction given by your physician.  You cannot drive while taking narcotics.  No lifting or carrying greater than 10 lbs. until further directed by your surgeon. Avoid  periods of inactivity such as sitting longer than an hour when not asleep. This helps prevent blood clots.  You may return to work once you are authorized by your doctor.   WEIGHT BEARING: Weight bearing as tolerated with assist device (walker, cane, etc) as directed, use it as long as suggested by your surgeon or therapist, typically at least 4-6 weeks.  EXERCISES  Results after joint replacement surgery are often greatly improved when you follow the exercise, range of motion and muscle strengthening exercises prescribed by your doctor. Safety measures are also important to protect the joint from further injury. Any time any of these exercises cause you to have increased pain or swelling, decrease what you are doing until you are comfortable again and then slowly increase them. If you have problems or questions, call your caregiver or physical therapist for advice.   Rehabilitation is important following a joint replacement. After just a few days of immobilization, the muscles of the leg can become weakened and shrink (atrophy).  These exercises are designed to build up the tone and strength of the thigh and leg muscles and to improve motion. Often times heat used for twenty to thirty minutes before working out will loosen up your tissues and help with improving the range of motion but do not use heat for the first two weeks following surgery (sometimes heat can increase post-operative swelling).   These exercises can be done on a training (exercise) mat, on the floor, on a table or on a bed. Use whatever works the best and is most comfortable for you.    Use music or television while you are exercising so that the exercises are a pleasant break in your day. This will make your life better with the exercises acting as a break in your routine that you can look forward to.   Perform all exercises about fifteen times, three times per day or as directed.  You should exercise both the operative leg and the  other leg  as well.  Exercises include:   Quad Sets - Tighten up the muscle on the front of the thigh (Quad) and hold for 5-10 seconds.   Straight Leg Raises - With your knee straight (if you were given a brace, keep it on), lift the leg to 60 degrees, hold for 3 seconds, and slowly lower the leg.  Perform this exercise against resistance later as your leg gets stronger.  Leg Slides: Lying on your back, slowly slide your foot toward your buttocks, bending your knee up off the floor (only go as far as is comfortable). Then slowly slide your foot back down until your leg is flat on the floor again.  Angel Wings: Lying on your back spread your legs to the side as far apart as you can without causing discomfort.  Hamstring Strength:  Lying on your back, push your heel against the floor with your leg straight by tightening up the muscles of your buttocks.  Repeat, but this time bend your knee to a comfortable angle, and push your heel against the floor.  You may put a pillow under the heel to make it more comfortable if necessary.   A rehabilitation program following joint replacement surgery can speed recovery and prevent re-injury in the future due to weakened muscles. Contact your doctor or a physical therapist for more information on knee rehabilitation.   CONSTIPATION:  Constipation is defined medically as fewer than three stools per week and severe constipation as less than one stool per week.  Even if you have a regular bowel pattern at home, your normal regimen is likely to be disrupted due to multiple reasons following surgery.  Combination of anesthesia, postoperative narcotics, change in appetite and fluid intake all can affect your bowels.   YOU MUST use at least one of the following options; they are listed in order of increasing strength to get the job done.  They are all available over the counter, and you may need to use some, POSSIBLY even all of these options:    Drink plenty of fluids  (prune juice may be helpful) and high fiber foods Colace 100 mg by mouth twice a day  Senokot for constipation as directed and as needed Dulcolax (bisacodyl), take with full glass of water   Miralax (polyethylene glycol) once or twice a day as needed.  If you have tried all these things and are unable to have a bowel movement in the first 3-4 days after surgery call either your surgeon or your primary doctor.    If you experience loose stools or diarrhea, hold the medications until you stool forms back up.  If your symptoms do not get better within 1 week or if they get worse, check with your doctor.  If you experience the worst abdominal pain ever or develop nausea or vomiting, please contact the office immediately for further recommendations for treatment.  ITCHING:  If you experience itching with your medications, try taking only a single pain pill, or even half a pain pill at a time.  You can also use Benadryl  over the counter for itching or also to help with sleep.   TED HOSE STOCKINGS:  Use stockings on both legs until for at least 2 weeks or as directed by physician office. They may be removed at night for sleeping.  MEDICATIONS:  See your medication summary on the "After Visit Summary" that nursing will review with you.  You may have some home medications which will be placed on hold until  you complete the course of blood thinner medication.  It is important for you to complete the blood thinner medication as prescribed.  Blood clot prevention (DVT Prophylaxis): After surgery you are at an increased risk for a blood clot.  You were prescribed a blood thinner, Aspirin 81mg , to be taken twice daily for a total of 4 weeks from surgery to help reduce your risk of getting a blood clot.  Signs of a pulmonary embolus (blood clot in the lungs) include sudden short of breath, feeling lightheaded or dizzy, chest pain with a deep breath, rapid pulse rapid breathing.  Signs of a blood clot in your arms  or legs include new unexplained swelling and cramping, warm, red or darkened skin around the painful area.  Please call the office or 911 right away if these signs or symptoms develop.  PRECAUTIONS:   If you experience chest pain or shortness of breath - call 911 immediately for transfer to the hospital emergency department.   If you develop a fever greater that 101 F, purulent drainage from wound, increased redness or drainage from wound, foul odor from the wound/dressing, or calf pain - CONTACT YOUR SURGEON.                                                   FOLLOW-UP APPOINTMENTS:  If you do not already have a post-op appointment, please call the office for an appointment to be seen by your surgeon.  Guidelines for how soon to be seen are listed in your "After Visit Summary", but are typically between 2-3 weeks after surgery.  If you have a specialized bandage, you may be told to follow up 1 week after surgery.  OTHER INSTRUCTIONS:  Knee Replacement:  Do not place pillow under knee, focus on keeping the knee straight while resting.  Place foam block, curve side up under heel at all times except when walking.  DO NOT modify, tear, cut, or change the foam block in any way.  POST-OPERATIVE OPIOID TAPER INSTRUCTIONS: It is important to wean off of your opioid medication as soon as possible. If you do not need pain medication after your surgery it is ok to stop day one. Opioids include: Codeine, Hydrocodone(Norco, Vicodin), Oxycodone (Percocet, oxycontin ) and hydromorphone  amongst others.  Long term and even short term use of opiods can cause: Increased pain response Dependence Constipation Depression Respiratory depression And more.  Withdrawal symptoms can include Flu like symptoms Nausea, vomiting And more Techniques to manage these symptoms Hydrate well Eat regular healthy meals Stay active Use relaxation techniques(deep breathing, meditating, yoga) Do Not substitute Alcohol to help  with tapering If you have been on opioids for less than two weeks and do not have pain than it is ok to stop all together.  Plan to wean off of opioids This plan should start within one week post op of your joint replacement. Maintain the same interval or time between taking each dose and first decrease the dose.  Cut the total daily intake of opioids by one tablet each day Next start to increase the time between doses. The last dose that should be eliminated is the evening dose.   MAKE SURE YOU:  Understand these instructions.  Get help right away if you are not doing well or get worse.    Thank you for letting us  be a  part of your medical care team.  It is a privilege we respect greatly.  We hope these instructions will help you stay on track for a fast and full recovery!            Signed: MOMINA HUNTON 11/07/2024, 8:44 AM

## 2024-11-02 NOTE — Progress Notes (Signed)
 Physical Therapy Treatment Patient Details Name: Kelly Thornton MRN: [id number] DOB: 10-Aug-1961 Today's Date: 11/02/2024   History of Present Illness Pt is 63 yo female s/p L TKA on 11/01/24.  Pt with hx including but not limited to adrenal mass, Raynaud's disease, orthostatic BP, hearing loss bil (R worse than L), obesity, DM2, arthritis, anxiety, fibromyalgia, HLD, HTN, reports Meniere's    PT Comments  Th patient reports dizziness with position changes  to sitting, moving to stand and during ambulation.   The  Patient ambulated  very short distances x 3. Patient remains dizzy  after sitting down. Will attempt again  in PM.  Will benefit from continued PT.   If plan is discharge home, recommend the following: A little help with walking and/or transfers;A little help with bathing/dressing/bathroom;Assistance with cooking/housework;Help with stairs or ramp for entrance   Can travel by private vehicle        Equipment Recommendations  Rolling walker (2 wheels)    Recommendations for Other Services       Precautions / Restrictions Precautions Precautions: Fall;Knee Precaution/Restrictions Comments: dizziness, body titubations when dizzy Restrictions LLE Weight Bearing Per Provider Order: Weight bearing as tolerated     Mobility  Bed Mobility Overal bed mobility: Needs Assistance Bed Mobility: Supine to Sit     Supine to sit: Min assist, Used rails, HOB elevated     General bed mobility comments: moves slowly  to transition to sitting, reported dizziness    Transfers Overall transfer level: Needs assistance Equipment used: Rolling walker (2 wheels) Transfers: Sit to/from Stand Sit to Stand: Min assist, From elevated surface           General transfer comment: cues for hand placement and L LE management(has a lift chair at home)    Ambulation/Gait Ambulation/Gait assistance: Min assist, +2 safety/equipment Gait Distance (Feet): 6 Feet (then 5' then  10') Assistive device: Rolling walker (2 wheels) Gait Pattern/deviations: Step-to pattern, Decreased weight shift to left, Shuffle, Antalgic Gait velocity: decreased     General Gait Details: noted trunk wobble when first stands, stops several times to stand and gain steadiness   Stairs             Wheelchair Mobility     Tilt Bed    Modified Rankin (Stroke Patients Only)       Balance Overall balance assessment: Needs assistance Sitting-balance support: No upper extremity supported, Feet supported Sitting balance-Leahy Scale: Good     Standing balance support: Reliant on assistive device for balance, During functional activity, Bilateral upper extremity supported Standing balance-Leahy Scale: Poor                              Communication Communication Factors Affecting Communication: Hearing impaired  Cognition Arousal: Alert Behavior During Therapy: WFL for tasks assessed/performed, Anxious   PT - Cognitive impairments: No apparent impairments                         Following commands: Intact      Cueing    Exercises Total Joint Exercises Ankle Circles/Pumps: AROM, Both, 10 reps Quad Sets: AROM, Both, 10 reps Heel Slides: AAROM, Left, 5 reps Hip ABduction/ADduction: AAROM, Left, 5 reps Straight Leg Raises: AAROM, Left, 10 reps Goniometric ROM: 5-45 seated    General Comments        Pertinent Vitals/Pain Pain Assessment Pain Score: 8  Pain Location: L knee  Pain Descriptors / Indicators: Aching, Constant Pain Intervention(s): Monitored during session, Premedicated before session, Repositioned, Ice applied    Home Living                          Prior Function            PT Goals (current goals can now be found in the care plan section) Progress towards PT goals: Progressing toward goals    Frequency    7X/week      PT Plan      Co-evaluation              AM-PAC PT 6 Clicks Mobility    Outcome Measure  Help needed turning from your back to your side while in a flat bed without using bedrails?: A Little Help needed moving from lying on your back to sitting on the side of a flat bed without using bedrails?: A Little Help needed moving to and from a bed to a chair (including a wheelchair)?: A Lot Help needed standing up from a chair using your arms (e.g., wheelchair or bedside chair)?: A Lot Help needed to walk in hospital room?: A Lot Help needed climbing 3-5 steps with a railing? : Total 6 Click Score: 13    End of Session Equipment Utilized During Treatment: Gait belt Activity Tolerance: Treatment limited secondary to medical complications (Comment) (diziness) Patient left: with call bell/phone within reach;in chair;with chair alarm set Nurse Communication: Mobility status PT Visit Diagnosis: Other abnormalities of gait and mobility (R26.89);Muscle weakness (generalized) (M62.81)     Time: 9149-9062 PT Time Calculation (min) (ACUTE ONLY): 47 min  Charges:    $Gait Training: 8-22 mins $Therapeutic Exercise: 8-22 mins $Therapeutic Activity: 8-22 mins PT General Charges $$ ACUTE PT VISIT: 1 Visit                    Darice Potters PT Acute Rehabilitation Services Office [phone]   Potters Darice Norris 11/02/2024, 3:01 PM

## 2024-11-02 NOTE — Plan of Care (Signed)

## 2024-11-02 NOTE — Progress Notes (Signed)
     Subjective:  Patient reports pain as moderate.  Reports that the pain medicine is lasting about 3-1/2 hours.  Did okay with physical therapy yesterday but felt lightheaded.  Will see if she can mobilize better and pain control today.  If doing well we will plan to discharge home today.  Yesterday's total administered Morphine Milligram Equivalents: 145   Objective:   VITALS:   Vitals:   11/01/24 1722 11/01/24 2207 11/02/24 0224 11/02/24 0555  BP: 131/75 123/65 (!) 141/66 (!) 159/75  Pulse: 77 67 89 91  Resp: 16 17 16 18   Temp: (!) 97.5 F (36.4 C) 97.6 F (36.4 C) 97.8 F (36.6 C) 97.9 F (36.6 C)  TempSrc:   Oral Oral  SpO2: 100% 100% 100% 99%  Weight:      Height:        Sensation intact distally Intact pulses distally Dorsiflexion/Plantar flexion intact Incision: dressing C/D/I Compartment soft    Lab Results  Component Value Date   WBC 6.8 11/02/2024   HGB 11.9 (L) 11/02/2024   HCT 35.8 (L) 11/02/2024   MCV 98.9 11/02/2024   PLT 160 11/02/2024   BMET    Component Value Date/Time   NA 133 (L) 11/02/2024 0400   NA 141 03/08/2024 0831   K 3.5 11/02/2024 0400   CL 98 11/02/2024 0400   CO2 26 11/02/2024 0400   GLUCOSE 99 11/02/2024 0400   BUN 13 11/02/2024 0400   BUN 16 03/08/2024 0831   CREATININE 0.91 11/02/2024 0400   CALCIUM  8.9 11/02/2024 0400   EGFR 71 03/08/2024 0831   GFRNONAA >60 11/02/2024 0400    Xray: TKA components in good position no adverse features  Assessment/Plan: 1 Day Post-Op   Principal Problem:   Primary osteoarthritis of left knee  S/p L TKA 11/01/24  Post op recs: WB: WBAT Abx: ancef Imaging: PACU xrays DVT prophylaxis: Aspirin 81mg  BID x4 weeks Follow up: 2 weeks after surgery for a wound check with Dr. Edna at [facility].  Address: [address], [place], [place] [address]  Office Phone: [phone]     TORIBIO DELENA EDNA 11/02/2024, 6:59 AM   Toribio Edna,  MD  Contact information:   [phone] 7am-5pm epic message Dr. Edna, or call office for patient follow up: [phone] After hours and holidays please check [email or web address] for group call information for [facility]

## 2024-11-03 LAB — CBC
HCT: 35 % — ABNORMAL LOW (ref 36.0–46.0)
Hemoglobin: 11.8 g/dL — ABNORMAL LOW (ref 12.0–15.0)
MCH: 32.7 pg (ref 26.0–34.0)
MCHC: 33.7 g/dL (ref 30.0–36.0)
MCV: 97 fL (ref 80.0–100.0)
Platelets: 172 K/uL (ref 150–400)
RBC: 3.61 MIL/uL — ABNORMAL LOW (ref 3.87–5.11)
RDW: 13.4 % (ref 11.5–15.5)
WBC: 7.2 K/uL (ref 4.0–10.5)
nRBC: 0 % (ref 0.0–0.2)

## 2024-11-03 NOTE — Progress Notes (Signed)
 Physical Therapy Treatment Patient Details Name: Kelly Thornton MRN: [id number] DOB: 07/11/1961 Today's Date: 11/03/2024   History of Present Illness Pt is 63 yo female s/p L TKA on 11/01/24.  Pt with hx including but not limited to adrenal mass, Raynaud's disease, orthostatic BP, hearing loss bil (R worse than L), obesity, DM2, arthritis, anxiety, fibromyalgia, HLD, HTN, reports Meniere's    PT Comments  Pt making good improvements with knee ROM, strength, and mobility but did develop some lightheadedness/syncopal symptoms and found to have orthostatic BP- notified RN.  Her dizziness/spinning has also improved compared to yesterday - she only had 1 episode when standing static and therapist holding arm for BP measurement (similar to this morning when testing RW height and standing without UE support).  She did ambulate 61' with RW and CGA - lightheadedness developed during ambulation. Will benefit from further therapy.    Blood Pressures: Pt had c/o lightheadedness and feeling hot and wiped out with ambulation.  BP was found to be 90/68 (pt reports is low for her) and HR 119 bpm.   Symptoms improved with sitting and BP up to 171/79 which pt reports is extremely high for her.  Rechecked R arm and still 165/75.  Had pt stand again and BP down to 88/56 with lightheaded/syncopal symptoms so transferred back to bed and BP was 180/87 in supine.  Notified RN of orthostatic BP with lightheadedness and syncopal symptoms.  Also, notified of elevated BP in sitting/supine that pt feels is abnormally high for her and she was questioning if accurate with dynamap.     If plan is discharge home, recommend the following: A little help with walking and/or transfers;A little help with bathing/dressing/bathroom;Assistance with cooking/housework;Help with stairs or ramp for entrance   Can travel by private vehicle        Equipment Recommendations  Rolling walker (2 wheels)    Recommendations for Other Services        Precautions / Restrictions Precautions Precautions: Fall;Knee Restrictions LLE Weight Bearing Per Provider Order: Weight bearing as tolerated     Mobility  Bed Mobility Overal bed mobility: Needs Assistance Bed Mobility: Sit to Supine     Supine to sit: Min assist Sit to supine: Min assist   General bed mobility comments: Min A for legs back to bed    Transfers Overall transfer level: Needs assistance Equipment used: Rolling walker (2 wheels) Transfers: Sit to/from Stand Sit to Stand: Min assist           General transfer comment: STS x 2 from recliner, does best pushing with R hand and L hand on walker with therapist stabilizing RW.  Required increased time and use of momentum    Ambulation/Gait Ambulation/Gait assistance: Contact guard assist Gait Distance (Feet): 80 Feet Assistive device: Rolling walker (2 wheels) Gait Pattern/deviations: Decreased weight shift to left Gait velocity: decreased     General Gait Details: Progressed from step to to step through; stable balance, good RW proximity without cues; had c/o lightheadedness and getting hot and washed out on the way back to room - found to have decreased BP   Stairs             Wheelchair Mobility     Tilt Bed    Modified Rankin (Stroke Patients Only)       Balance Overall balance assessment: Needs assistance Sitting-balance support: No upper extremity supported, Feet supported Sitting balance-Leahy Scale: Good     Standing balance support: Bilateral upper extremity supported,  Reliant on assistive device for balance Standing balance-Leahy Scale: Poor Standing balance comment: Steady wtih RW but does need UE support, without RW static stand with  mild swaying                            Communication Communication Factors Affecting Communication: Hearing impaired  Cognition Arousal: Alert Behavior During Therapy: WFL for tasks assessed/performed   PT - Cognitive  impairments: No apparent impairments                                Cueing    Exercises Total Joint Exercises Ankle Circles/Pumps: AROM, Both, 10 reps Quad Sets: AROM, Both, 10 reps Heel Slides: AAROM, Left, 10 reps, Supine Hip ABduction/ADduction: AAROM, Left, 10 reps, Supine Long Arc Quad: AAROM, Left, 10 reps, Seated Knee Flexion: AAROM, Left, 10 reps, Seated Goniometric ROM: L knee 5 to 60    General Comments General comments (skin integrity, edema, etc.): Pt reports some lightheadedness with ambulation.      Pertinent Vitals/Pain Pain Assessment Pain Assessment: 0-10 Pain Score: 4  Pain Location: L knee Pain Descriptors / Indicators: Constant, Sore Pain Intervention(s): Limited activity within patient's tolerance, Monitored during session, Premedicated before session, Repositioned, Ice applied    Home Living                          Prior Function            PT Goals (current goals can now be found in the care plan section) Progress towards PT goals: Progressing toward goals    Frequency    7X/week      PT Plan      Co-evaluation              AM-PAC PT 6 Clicks Mobility   Outcome Measure  Help needed turning from your back to your side while in a flat bed without using bedrails?: A Little Help needed moving from lying on your back to sitting on the side of a flat bed without using bedrails?: A Little Help needed moving to and from a bed to a chair (including a wheelchair)?: A Little Help needed standing up from a chair using your arms (e.g., wheelchair or bedside chair)?: A Little Help needed to walk in hospital room?: A Little Help needed climbing 3-5 steps with a railing? : A Lot 6 Click Score: 17    End of Session Equipment Utilized During Treatment: Gait belt Activity Tolerance: Other (comment) (tolerated well but did have orthostatic bp with ambulation) Patient left: with call bell/phone within reach;with  family/visitor present;in bed;with bed alarm set;with SCD's reapplied Nurse Communication: Mobility status PT Visit Diagnosis: Other abnormalities of gait and mobility (R26.89);Muscle weakness (generalized) (M62.81)     Time: 8582-8547 PT Time Calculation (min) (ACUTE ONLY): 35 min  Charges:    $Gait Training: 8-22 mins $Therapeutic Exercise: 8-22 mins PT General Charges $$ ACUTE PT VISIT: 1 Visit                     Kelly, PT Acute Rehab [facility] [phone]    Kelly Thornton 11/03/2024, 3:37 PM

## 2024-11-03 NOTE — Progress Notes (Signed)
     Subjective:  Patient reports pain as mild-to-moderate.  No overnight events.  Did okay with PT yesterday but again had some lightheadedness with position changes.  Restarted her blood pressure med which helps with her dizziness she gets associated with her Meniere's disease.  Did not clear PT yesterday PM, hopeful for dispo today.  No numbness or tingling of surgical leg.   Yesterday's total administered Morphine Milligram Equivalents: 60   Objective:   VITALS:   Vitals:   11/02/24 0938 11/02/24 1328 11/02/24 2001 11/03/24 0508  BP: (!) 149/79 125/68 136/62 (!) 124/58  Pulse: 92 84 96 88  Resp: 16 16 17 14   Temp: 98.7 F (37.1 C) 98.7 F (37.1 C) 98.8 F (37.1 C) 98.3 F (36.8 C)  TempSrc: Oral Oral Oral Oral  SpO2: 100% 97% 100% 99%  Weight:      Height:        Resting in bed comfortably in NAD Sensation intact distally Intact pulses distally Dorsiflexion/Plantar flexion intact Incision: dressing C/D/I Compartment soft Wiggles toes appropriately   Lab Results  Component Value Date   WBC 7.2 11/03/2024   HGB 11.8 (L) 11/03/2024   HCT 35.0 (L) 11/03/2024   MCV 97.0 11/03/2024   PLT 172 11/03/2024   BMET    Component Value Date/Time   NA 133 (L) 11/02/2024 0400   NA 141 03/08/2024 0831   K 3.5 11/02/2024 0400   CL 98 11/02/2024 0400   CO2 26 11/02/2024 0400   GLUCOSE 99 11/02/2024 0400   BUN 13 11/02/2024 0400   BUN 16 03/08/2024 0831   CREATININE 0.91 11/02/2024 0400   CALCIUM  8.9 11/02/2024 0400   EGFR 71 03/08/2024 0831   GFRNONAA >60 11/02/2024 0400    Xray: TKA components in good position no adverse features  Assessment/Plan: 2 Days Post-Op   Principal Problem:   Primary osteoarthritis of left knee  S/p L TKA 11/01/24  Post op recs: WB: WBAT Abx: ancef Imaging: PACU xrays DVT prophylaxis: Aspirin 81mg  BID x4 weeks Follow up: 2 weeks after surgery for a wound check with Dr. Edna at [facility]  Address: [address], [place], [place] [address]  Office Phone: [phone]     Kelly Thornton 11/03/2024, 7:15 AM     Contact information:   Weekdays 7am-5pm epic message Dr. Edna, or call office for patient follow up: [phone] After hours and holidays please check [email or web address] for group call information for [facility]

## 2024-11-03 NOTE — Progress Notes (Signed)
 Physical Therapy Treatment Patient Details Name: Kelly Thornton MRN: [id number] DOB: 1961/07/29 Today's Date: 11/03/2024   History of Present Illness Pt is 63 yo female s/p L TKA on 11/01/24.  Pt with hx including but not limited to adrenal mass, Raynaud's disease, orthostatic BP, hearing loss bil (R worse than L), obesity, DM2, arthritis, anxiety, fibromyalgia, HLD, HTN, reports Meniere's    PT Comments  Pt making good progress today.  Reports good improvement in pain and dizziness (she feels that getting back on her normal meds has helped dizziness).  Did have one episode of dizziness in standing (had been up several mins and walked) but eased quickly and stabilized with UE support.  Suspect dizziness related to pt's Meniere's disease in which no real benefit of therapy for dizziness other than working on balance and AD use.  She did ambulate 61' with RW and CGA with stable gait pattern.  Tolerated exercises well.  Plan to f/u in afternoon.    If plan is discharge home, recommend the following: A little help with walking and/or transfers;A little help with bathing/dressing/bathroom;Assistance with cooking/housework;Help with stairs or ramp for entrance   Can travel by private vehicle        Equipment Recommendations  Rolling walker (2 wheels)    Recommendations for Other Services       Precautions / Restrictions Precautions Precautions: Fall;Knee Restrictions LLE Weight Bearing Per Provider Order: Weight bearing as tolerated     Mobility  Bed Mobility Overal bed mobility: Needs Assistance Bed Mobility: Supine to Sit     Supine to sit: Min assist     General bed mobility comments: light min A when scooting forward    Transfers Overall transfer level: Needs assistance Equipment used: Rolling walker (2 wheels) Transfers: Sit to/from Stand Sit to Stand: Min assist, From elevated surface           General transfer comment: Pt preferring to push up on RW from bed as bed  too soft and giving too much.  Discussed if using RW needs to have someone to stabilize.  Sister verbalized understanding.  Pt stood x 2 from recliner and was able to use armrest from chair.  Light min A to stand from both.    Ambulation/Gait Ambulation/Gait assistance: Contact guard assist Gait Distance (Feet): 80 Feet Assistive device: Rolling walker (2 wheels) Gait Pattern/deviations: Decreased weight shift to left Gait velocity: decreased     General Gait Details: Progressed from step to to step through; stable today; initial cues to not get too close to RW - pt corrected well   Stairs             Wheelchair Mobility     Tilt Bed    Modified Rankin (Stroke Patients Only)       Balance Overall balance assessment: Needs assistance Sitting-balance support: No upper extremity supported, Feet supported Sitting balance-Leahy Scale: Good     Standing balance support: Bilateral upper extremity supported, Reliant on assistive device for balance Standing balance-Leahy Scale: Poor Standing balance comment: Steady wtih RW; when pt lowered hands from RW to set height she felt dizzy and had mild unsteadiness                            Communication    Cognition Arousal: Alert Behavior During Therapy: WFL for tasks assessed/performed   PT - Cognitive impairments: No apparent impairments  Cueing    Exercises Total Joint Exercises Ankle Circles/Pumps: AROM, Both, 10 reps Quad Sets: AROM, Both, 10 reps Heel Slides: AAROM, Left, 10 reps, Supine Hip ABduction/ADduction: AAROM, Left, 10 reps, Supine Long Arc Quad: AAROM, Left, 10 reps, Seated Knee Flexion: AAROM, Left, 10 reps, Seated Goniometric ROM: L knee 5 to 60    General Comments General comments (skin integrity, edema, etc.): Pt reports knee feels much better today.  Also , reports dizziness seems to be better.  SHe only experience dizziness once during session  when she was standing without UE support but improved quickly.  Reports she has been started back on her BP medications that help with her dizziness.      Pertinent Vitals/Pain Pain Assessment Pain Assessment: 0-10 Pain Score: 4  Pain Location: L knee Pain Descriptors / Indicators: Aching, Constant Pain Intervention(s): Limited activity within patient's tolerance, Monitored during session, Premedicated before session, Repositioned, Ice applied    Home Living                          Prior Function            PT Goals (current goals can now be found in the care plan section) Progress towards PT goals: Progressing toward goals    Frequency    7X/week      PT Plan      Co-evaluation              AM-PAC PT 6 Clicks Mobility   Outcome Measure  Help needed turning from your back to your side while in a flat bed without using bedrails?: A Little Help needed moving from lying on your back to sitting on the side of a flat bed without using bedrails?: A Little Help needed moving to and from a bed to a chair (including a wheelchair)?: A Little Help needed standing up from a chair using your arms (e.g., wheelchair or bedside chair)?: A Little Help needed to walk in hospital room?: A Little Help needed climbing 3-5 steps with a railing? : A Lot 6 Click Score: 17    End of Session Equipment Utilized During Treatment: Gait belt Activity Tolerance: Patient tolerated treatment well Patient left: with call bell/phone within reach;in chair;with chair alarm set;with family/visitor present Nurse Communication: Mobility status PT Visit Diagnosis: Other abnormalities of gait and mobility (R26.89);Muscle weakness (generalized) (M62.81)     Time: 8947-8881 PT Time Calculation (min) (ACUTE ONLY): 26 min  Charges:    $Gait Training: 8-22 mins $Therapeutic Exercise: 8-22 mins PT General Charges $$ ACUTE PT VISIT: 1 Visit                     Benjiman, PT Acute Rehab  [facility] [phone]    Benjiman VEAR Mulberry 11/03/2024, 12:26 PM

## 2024-11-03 NOTE — Plan of Care (Signed)
   Problem: Health Behavior/Discharge Planning: Goal: Ability to manage health-related needs will improve Outcome: Progressing   Problem: Clinical Measurements: Goal: Ability to maintain clinical measurements within normal limits will improve Outcome: Progressing Goal: Will remain free from infection Outcome: Progressing

## 2024-11-04 DIAGNOSIS — Z881 Allergy status to other antibiotic agents status: Secondary | ICD-10-CM | POA: Diagnosis not present

## 2024-11-04 DIAGNOSIS — Z7951 Long term (current) use of inhaled steroids: Secondary | ICD-10-CM | POA: Diagnosis not present

## 2024-11-04 DIAGNOSIS — M1711 Unilateral primary osteoarthritis, right knee: Secondary | ICD-10-CM | POA: Diagnosis present

## 2024-11-04 DIAGNOSIS — Z91048 Other nonmedicinal substance allergy status: Secondary | ICD-10-CM | POA: Diagnosis not present

## 2024-11-04 DIAGNOSIS — Z974 Presence of external hearing-aid: Secondary | ICD-10-CM | POA: Diagnosis not present

## 2024-11-04 DIAGNOSIS — E876 Hypokalemia: Secondary | ICD-10-CM | POA: Diagnosis present

## 2024-11-04 DIAGNOSIS — Z888 Allergy status to other drugs, medicaments and biological substances status: Secondary | ICD-10-CM | POA: Diagnosis not present

## 2024-11-04 DIAGNOSIS — Z880 Allergy status to penicillin: Secondary | ICD-10-CM | POA: Diagnosis not present

## 2024-11-04 DIAGNOSIS — I10 Essential (primary) hypertension: Secondary | ICD-10-CM | POA: Diagnosis present

## 2024-11-04 DIAGNOSIS — Z8249 Family history of ischemic heart disease and other diseases of the circulatory system: Secondary | ICD-10-CM | POA: Diagnosis not present

## 2024-11-04 DIAGNOSIS — E785 Hyperlipidemia, unspecified: Secondary | ICD-10-CM | POA: Diagnosis present

## 2024-11-04 DIAGNOSIS — E871 Hypo-osmolality and hyponatremia: Secondary | ICD-10-CM | POA: Diagnosis present

## 2024-11-04 DIAGNOSIS — Z803 Family history of malignant neoplasm of breast: Secondary | ICD-10-CM | POA: Diagnosis not present

## 2024-11-04 DIAGNOSIS — Z91013 Allergy to seafood: Secondary | ICD-10-CM | POA: Diagnosis not present

## 2024-11-04 DIAGNOSIS — I951 Orthostatic hypotension: Secondary | ICD-10-CM | POA: Diagnosis present

## 2024-11-04 DIAGNOSIS — M1712 Unilateral primary osteoarthritis, left knee: Secondary | ICD-10-CM | POA: Diagnosis present

## 2024-11-04 DIAGNOSIS — H9190 Unspecified hearing loss, unspecified ear: Secondary | ICD-10-CM | POA: Diagnosis present

## 2024-11-04 DIAGNOSIS — Z883 Allergy status to other anti-infective agents status: Secondary | ICD-10-CM | POA: Diagnosis not present

## 2024-11-04 DIAGNOSIS — E039 Hypothyroidism, unspecified: Secondary | ICD-10-CM | POA: Diagnosis present

## 2024-11-04 DIAGNOSIS — M170 Bilateral primary osteoarthritis of knee: Secondary | ICD-10-CM | POA: Diagnosis present

## 2024-11-04 DIAGNOSIS — F32A Depression, unspecified: Secondary | ICD-10-CM | POA: Diagnosis present

## 2024-11-04 DIAGNOSIS — N3010 Interstitial cystitis (chronic) without hematuria: Secondary | ICD-10-CM | POA: Diagnosis present

## 2024-11-04 DIAGNOSIS — Z79890 Hormone replacement therapy (postmenopausal): Secondary | ICD-10-CM | POA: Diagnosis not present

## 2024-11-04 DIAGNOSIS — J45909 Unspecified asthma, uncomplicated: Secondary | ICD-10-CM | POA: Diagnosis present

## 2024-11-04 DIAGNOSIS — E119 Type 2 diabetes mellitus without complications: Secondary | ICD-10-CM | POA: Diagnosis present

## 2024-11-04 LAB — CBC WITH DIFFERENTIAL/PLATELET
Abs Immature Granulocytes: 0.03 K/uL (ref 0.00–0.07)
Basophils Absolute: 0 K/uL (ref 0.0–0.1)
Basophils Relative: 0 %
Eosinophils Absolute: 0 K/uL (ref 0.0–0.5)
Eosinophils Relative: 0 %
HCT: 31.1 % — ABNORMAL LOW (ref 36.0–46.0)
Hemoglobin: 10.5 g/dL — ABNORMAL LOW (ref 12.0–15.0)
Immature Granulocytes: 0 %
Lymphocytes Relative: 13 %
Lymphs Abs: 0.9 K/uL (ref 0.7–4.0)
MCH: 32.6 pg (ref 26.0–34.0)
MCHC: 33.8 g/dL (ref 30.0–36.0)
MCV: 96.6 fL (ref 80.0–100.0)
Monocytes Absolute: 0.6 K/uL (ref 0.1–1.0)
Monocytes Relative: 9 %
Neutro Abs: 5.5 K/uL (ref 1.7–7.7)
Neutrophils Relative %: 78 %
Platelets: 193 K/uL (ref 150–400)
RBC: 3.22 MIL/uL — ABNORMAL LOW (ref 3.87–5.11)
RDW: 13.7 % (ref 11.5–15.5)
WBC: 7.1 K/uL (ref 4.0–10.5)
nRBC: 0 % (ref 0.0–0.2)

## 2024-11-04 LAB — COMPREHENSIVE METABOLIC PANEL WITH GFR
ALT: 17 U/L (ref 0–44)
AST: 24 U/L (ref 15–41)
Albumin: 3.2 g/dL — ABNORMAL LOW (ref 3.5–5.0)
Alkaline Phosphatase: 91 U/L (ref 38–126)
Anion gap: 10 (ref 5–15)
BUN: 8 mg/dL (ref 8–23)
CO2: 25 mmol/L (ref 22–32)
Calcium: 9.2 mg/dL (ref 8.9–10.3)
Chloride: 97 mmol/L — ABNORMAL LOW (ref 98–111)
Creatinine, Ser: 0.94 mg/dL (ref 0.44–1.00)
GFR, Estimated: >60 mL/min (ref >60)
Glucose, Bld: 135 mg/dL — ABNORMAL HIGH (ref 70–99)
Potassium: 3.8 mmol/L (ref 3.5–5.1)
Sodium: 132 mmol/L — ABNORMAL LOW (ref 135–145)
Total Bilirubin: 0.4 mg/dL (ref 0.0–1.2)
Total Protein: 5.5 g/dL — ABNORMAL LOW (ref 6.5–8.1)

## 2024-11-04 LAB — MAGNESIUM: Magnesium: 2 mg/dL (ref 1.7–2.4)

## 2024-11-04 MED ORDER — SODIUM CHLORIDE 0.9 % IV SOLN: INTRAVENOUS | Status: DC

## 2024-11-04 MED ORDER — SODIUM CHLORIDE 0.9 % IV BOLUS
500.0000 mL | Freq: Once | INTRAVENOUS | Status: AC
  Administered 2024-11-04: 500 mL via INTRAVENOUS

## 2024-11-04 NOTE — Progress Notes (Addendum)
 Patient just got up and worked with PT. She was positive ortho static. lying 115/57 hrt rate 85 Sitting 102/62 hrt rate 100 Standing 52/42 hrt rate 117, after she got back in bed bp was 155/70. Patient was symptomatic. Notified MD and PA.

## 2024-11-04 NOTE — Plan of Care (Signed)

## 2024-11-04 NOTE — Progress Notes (Addendum)
     Subjective:  Patient reports pain as mild-to-moderate, improved from yesterday.  No overnight events.  Still had some variations in blood pressure with PT yesterday though mobilized 91ft x 2 with RW.  Back on her blood pressure meds which helps with her dizziness she gets associated with her Meniere's disease.  Again unfortunately did not clear PT yesterday PM, though more hopeful for dispo today.  No numbness or tingling of surgical leg.   Yesterday's total administered Morphine Milligram Equivalents: 60   Objective:   VITALS:   Vitals:   11/03/24 1549 11/03/24 1939 11/03/24 2208 11/04/24 0613  BP: (!) 122/56  (!) 126/58 132/61  Pulse:   89 88  Resp:   16 16  Temp:   98.4 F (36.9 C) 97.9 F (36.6 C)  TempSrc:   Oral Oral  SpO2:  93% 96% 98%  Weight:      Height:        Resting in bed comfortably in NAD Sensation intact distally Intact pulses distally Dorsiflexion/Plantar flexion intact Incision: dressing C/D/I Compartment soft Wiggles toes appropriately   Lab Results  Component Value Date   WBC 7.2 11/03/2024   HGB 11.8 (L) 11/03/2024   HCT 35.0 (L) 11/03/2024   MCV 97.0 11/03/2024   PLT 172 11/03/2024   BMET    Component Value Date/Time   NA 133 (L) 11/02/2024 0400   NA 141 03/08/2024 0831   K 3.5 11/02/2024 0400   CL 98 11/02/2024 0400   CO2 26 11/02/2024 0400   GLUCOSE 99 11/02/2024 0400   BUN 13 11/02/2024 0400   BUN 16 03/08/2024 0831   CREATININE 0.91 11/02/2024 0400   CALCIUM  8.9 11/02/2024 0400   EGFR 71 03/08/2024 0831   GFRNONAA >60 11/02/2024 0400    Xray: TKA components in good position no adverse features  Assessment/Plan: 3 Days Post-Op   Principal Problem:   Primary osteoarthritis of left knee  S/p L TKA 11/01/24  Post op recs: WB: WBAT Abx: ancef Imaging: PACU xrays DVT prophylaxis: Aspirin 81mg  BID x4 weeks Follow up: 2 weeks after surgery for a wound check with Dr. Edna at [facility].  Address:  [address], [place], [place] [address]  Office Phone: [phone]     Kelly Thornton 11/04/2024, 7:45 AM    Contact information:   Weekdays 7am-5pm epic message Dr. Edna, or call office for patient follow up: [phone] After hours and holidays please check [email or web address] for group call information for [facility]

## 2024-11-04 NOTE — Progress Notes (Signed)
 atient just got up and worked with PT. She was positive ortho static. lying 115/57 hrt rate 85 Sitting 102/62 hrt rate 100 Standing 52/42 hrt rate 117. after she got back in bed bp was 115/70.Kelly Thornton Patient was symptomatic. Notified MD and PA.

## 2024-11-04 NOTE — Progress Notes (Signed)
 Physical Therapy Treatment Patient Details Name: Kelly Thornton MRN: [id number] DOB: November 22, 1961 Today's Date: 11/04/2024   History of Present Illness Pt is 63 yo female s/p L TKA on 11/01/24.  Pt with hx including but not limited to adrenal mass, Raynaud's disease, orthostatic BP, hearing loss bil (R worse than L), obesity, DM2, arthritis, anxiety, fibromyalgia, HLD, HTN, reports Meniere's    PT Comments  Pt in bed, visitor at bedside, pt agreeable to session. States that she has 4/10 pain in L knee but was able to sleep better last night. Orthostatics assessed with progression from supine to standing today. Pt able to come to sit edge of bed with supervision and use of bed rails, in sitting is slightly symptomatic with light headedness. In standing, pt reports inc symptoms of warm feeling in her neck, dizziness, but pt offers that it is unlike the dizziness she gets with her meniere's disease. Pt unsafe to progress activity at this time. Pt returns to bed, symptoms subside. Nursing and MD notified of findings.   Orthostatics: Supine: 115/57, 85 bpm Sitting: 102/62, 100 bpm- slight onset of light headedness Standing: 52/42, 117 bpm- inc dizziness (not consistent with her familiar dizziness), and feels warm/hot in her neck  Return to supine: 155/70, 90 bpm-symptoms subsiding   If plan is discharge home, recommend the following: A little help with walking and/or transfers;A little help with bathing/dressing/bathroom;Assistance with cooking/housework;Help with stairs or ramp for entrance   Can travel by private vehicle        Equipment Recommendations  Rolling walker (2 wheels)    Recommendations for Other Services       Precautions / Restrictions Precautions Precautions: Fall;Knee Recall of Precautions/Restrictions: Intact Precaution/Restrictions Comments: dizziness, body titubations when dizzy Restrictions Weight Bearing Restrictions Per Provider Order: Yes LLE Weight Bearing Per  Provider Order: Weight bearing as tolerated     Mobility  Bed Mobility Overal bed mobility: Needs Assistance Bed Mobility: Supine to Sit, Sit to Supine     Supine to sit: Supervision Sit to supine: Supervision   General bed mobility comments: inc time and use of bed rail, cues for scooting to position    Transfers Overall transfer level: Needs assistance Equipment used: Rolling walker (2 wheels) Transfers: Sit to/from Stand Sit to Stand: Supervision           General transfer comment: Pt completes STS from elevated surface per home setup, with bed in low position, pt unable to complete standing transfer. Uses rail at foot of bed and other hand on walker. In standing, pt became symptomatic with orthostatic hypotension    Ambulation/Gait                   Stairs             Wheelchair Mobility     Tilt Bed    Modified Rankin (Stroke Patients Only)       Balance Overall balance assessment: Needs assistance Sitting-balance support: No upper extremity supported, Feet supported Sitting balance-Leahy Scale: Good     Standing balance support: Bilateral upper extremity supported, Reliant on assistive device for balance Standing balance-Leahy Scale: Poor Standing balance comment: Steady with walker, unable to remove UE support                            Communication Communication Communication: Impaired Factors Affecting Communication: Hearing impaired  Cognition Arousal: Alert Behavior During Therapy: [facility] for tasks assessed/performed  PT - Cognitive impairments: No apparent impairments                         Following commands: Intact      Cueing Cueing Techniques: Verbal cues, Gestural cues  Exercises      General Comments General comments (skin integrity, edema, etc.): orthostatic      Pertinent Vitals/Pain Pain Assessment Pain Assessment: 0-10 Pain Score: 4  Pain Location: L knee Pain Descriptors /  Indicators: Constant, Sore, Operative site guarding Pain Intervention(s): Limited activity within patient's tolerance, Monitored during session    Home Living                          Prior Function            PT Goals (current goals can now be found in the care plan section) Acute Rehab PT Goals Patient Stated Goal: return home PT Goal Formulation: With patient/family Time For Goal Achievement: 11/15/24 Potential to Achieve Goals: Good Progress towards PT goals: Progressing toward goals    Frequency    7X/week      PT Plan      Co-evaluation              AM-PAC PT 6 Clicks Mobility   Outcome Measure  Help needed turning from your back to your side while in a flat bed without using bedrails?: A Little Help needed moving from lying on your back to sitting on the side of a flat bed without using bedrails?: A Little Help needed moving to and from a bed to a chair (including a wheelchair)?: A Little Help needed standing up from a chair using your arms (e.g., wheelchair or bedside chair)?: A Little Help needed to walk in hospital room?: A Little Help needed climbing 3-5 steps with a railing? : A Lot 6 Click Score: 17    End of Session Equipment Utilized During Treatment: Gait belt Activity Tolerance: Other (comment) (orthostatic, unable to safely progress today) Patient left: with call bell/phone within reach;with family/visitor present;in bed;with bed alarm set;with SCD's reapplied Nurse Communication: Mobility status PT Visit Diagnosis: Other abnormalities of gait and mobility (R26.89);Muscle weakness (generalized) (M62.81)     Time: 1035-1101 PT Time Calculation (min) (ACUTE ONLY): 26 min  Charges:    $Therapeutic Activity: 23-37 mins PT General Charges $$ ACUTE PT VISIT: 1 Visit                     Stann, PT Acute Rehabilitation Services Office: [phone] 11/04/2024    Stann DELENA Ohara 11/04/2024, 11:10 AM

## 2024-11-04 NOTE — Consult Note (Signed)
 Medical Consultation   Kelly Thornton  FMW:[id number]  DOB: [birth year]  DOA: 11/01/2024    Requesting physician: Dr. Edna  Reason for consultation: Orthostatic hypotension  History of Present Illness: 63 year old female with past medical history of hypertension, hypothyroidism, Sjogren's disease, diabetes mellitus type 2, PVCs on mexiletine and Coreg , Mnire's disease, depression, asthma, morbid obesity and osteoarthritis underwent elective left total knee arthroplasty by orthopedics on 11/01/2024.  Subsequently, hospital course complicated by increasing pain and dizziness with inability to work with PT.  Today, patient was orthostatic with standing blood pressure dropping to the 50/42 and patient was tachycardic.  TRH was consulted.  No fever, chest pain, shortness of breath, vomiting, abdominal pain, diarrhea, dysuria, loss of consciousness, syncope reported.  Patient complains of intermittent left knee pain.    Review of Systems:  As per HPI otherwise 10 point review of systems negative.   Past Medical History: Past Medical History:  Diagnosis Date   Abnormal uterine bleeding    Anxiety    Arthritis    Cataract    Chronic interstitial cystitis    Colitis, ischemic    Cough variant asthma 04/02/2018   FENO 04/02/2018  =   11 on advair 250 one bid - Spirometry 04/02/2018  FEV1 2.39 (84%)  Ratio 87 with truncation of peak flow p advair prior  - 04/02/2018  After extensive coaching inhaler device  effectiveness =    75% from a baseline of 50% so try symbicort  80  1-2 bid     Cushing disease (HCC)    Depression    Dermatitis of both ear canals    Diabetes mellitus without complication (HCC)    borderline   Dyspnea    Dysrhythmia    Endometriosis    Fibromyalgia    GERD (gastroesophageal reflux disease)    Gout    Hearing loss    Hyperlipidemia    Hypertension    Hypothyroidism    Irritable bowel syndrome (IBS)    Meniere disease    Bilateral    Morbid obesity (HCC)    Nonsustained ventricular tachycardia (HCC)    Osteopenia    Pneumonia    PONV (postoperative nausea and vomiting)    PVC (premature ventricular contraction)    Seasonal allergies    Sjogren's disease    Tachycardia    Tinnitus    Wears hearing aid in both ears     Past Surgical History: Past Surgical History:  Procedure Laterality Date   BIOPSY  08/15/2020   Procedure: BIOPSY;  Surgeon: Dianna Specking, MD;  Location: [facility];  Service: Endoscopy;;   bladder stretching     x2   COLONOSCOPY WITH PROPOFOL  N/A 08/15/2020   Procedure: COLONOSCOPY WITH PROPOFOL ;  Surgeon: Dianna Specking, MD;  Location: [facility];  Service: Endoscopy;  Laterality: N/A;   CYSTOSCOPY     DIAGNOSTIC LAPAROSCOPY     x4   DILATION AND CURETTAGE OF UTERUS     ENDOMETRIAL BIOPSY     EXCISION OF TONGUE LESION Left 10/02/2018   Procedure: EXCISION OF TONGUE LESION;  Surgeon: Mable Lenis, MD;  Location: [facility] OR;  Service: ENT;  Laterality: Left;   EYE SURGERY     stye removed   HAMMER TOE SURGERY Right 2023   MYELOGRAM  1985   NASAL SEPTUM SURGERY     papiloma  2014   under tongue   ROBOTIC ADRENALECTOMY Left 11/18/2023  Procedure: XI ROBOTIC LEFT ADRENALECTOMY;  Surgeon: Devere Lonni Righter, MD;  Location: [facility];  Service: Urology;  Laterality: Left;   STAPEDECTOMY     TONSILLECTOMY AND ADENOIDECTOMY     TOTAL KNEE ARTHROPLASTY Left 11/01/2024   Procedure: ARTHROPLASTY, KNEE, TOTAL;  Surgeon: Edna Toribio LABOR, MD;  Location: [facility];  Service: Orthopedics;  Laterality: Left;   TUBAL LIGATION     WISDOM TOOTH EXTRACTION       Allergies:   Allergies  Allergen Reactions   Isopropyl Alcohol Rash   Mineral Oil Rash   Monosodium Glutamate Nausea And Vomiting   Nickel Rash   Pneumovax [Pneumococcal Polysaccharide Vaccine] Rash   Procaine Shortness Of Breath and Rash   Rubbing Alcohol [Alcohol] Other (See Comments)    burns skin    Shellfish  Allergy  Anaphylaxis   Silicone Dioxide [Silica] Rash and Other (See Comments)    Skin burning   Ace Inhibitors Swelling    Lips swell   Avelox [Moxifloxacin Hcl In Nacl] Other (See Comments)    Gastritis   Cephalosporins     Other reaction(s): Unknown   Chocolate Other (See Comments)    Sneezing   Clofexamide     Other reaction(s): Unknown   Cocoa Other (See Comments)    Sneezing   Codeine Nausea Only and Other (See Comments)    GI upset   Darvon [Propoxyphene] Other (See Comments)    GI Upset   Fluorescein-Benoxinate Other (See Comments)    Eye irritation   Hydrocodone-Acetaminophen  Other (See Comments)    GI upset *Vicodin    Lisinopril Swelling    Tongue and lips became swollen   Silicone     Other reaction(s): Unknown   Simvastatin Other (See Comments)    Leg cramps   Tape Other (See Comments)    Redness Other reaction(s): Unknown   Tetracyclines & Related Other (See Comments)    GI upset   Betadine [Povidone Iodine] Rash    Red rash with blisters    Bextra [Valdecoxib] Rash   Biaxin [Clarithromycin] Rash   Ceclor [Cefaclor] Rash   Doxycycline Rash   Erythromycin Rash   Hibiclens  [Chlorhexidine  Gluconate] Rash   Keflex [Cephalexin] Rash   Penicillins Rash    Did it involve swelling of the face/tongue/throat, SOB, or low BP? Yes Did it involve sudden or severe rash/hives, skin peeling, or any reaction on the inside of your mouth or nose? No Did you need to seek medical attention at a hospital or doctor's office? No When did it last happen? I was a kid   If all above answers are NO, may proceed with cephalosporin use.    Povidone-Iodine Rash        Thimerosal (Thiomersal) Rash     Social History:  reports that she has never smoked. She has never been exposed to tobacco smoke. She has never used smokeless tobacco. She reports that she does not currently use alcohol. She reports that she does not use drugs.   Family History: Family History  Problem  Relation Age of Onset   Breast cancer Maternal Aunt    Breast cancer Maternal Grandmother    Cancer Other    Hyperlipidemia Other    Hypertension Other    Heart disease Other    Sleep apnea Other       Physical Exam: Vitals:   11/03/24 1939 11/03/24 2208 11/04/24 0613 11/04/24 0847  BP:  (!) 126/58 132/61 132/61  Pulse:  89 88 88  Resp:  16 16   Temp:  98.4 F (36.9 C) 97.9 F (36.6 C)   TempSrc:  Oral Oral   SpO2: 93% 96% 98%   Weight:      Height:        Constitutional: NAD, calm, comfortable Eyes: PERRL, lids and conjunctivae normal ENMT: Mucous membranes are moist. Posterior pharynx clear of any exudate or lesions. Neck: normal, supple, no masses, no thyromegaly Respiratory: bilateral decreased breath sounds at bases, no wheezing, no crackles. Normal respiratory effort. No accessory muscle use.  Cardiovascular: S1 S2 positive, rate controlled. No extremity edema. 2+ pedal pulses.  Abdomen: Morbidly obese, no tenderness, no masses palpated. No hepatosplenomegaly. Bowel sounds positive.  Musculoskeletal: no clubbing / cyanosis.  Left knee dressing present  skin: no rashes, lesions, ulcers. No induration Neurologic: CN 2-12 grossly intact. Moving extremities. No focal neurologic deficits.  Psychiatric: Flat affect.  Not agitated.   Data reviewed:  I have personally reviewed following labs and imaging studies Labs:  CBC: Recent Labs  Lab 11/01/24 0622 11/02/24 0400 11/03/24 0345  WBC 6.5 6.8 7.2  NEUTROABS 4.0  --   --   HGB 13.5 11.9* 11.8*  HCT 42.5 35.8* 35.0*  MCV 98.6 98.9 97.0  PLT 210 160 172    Basic Metabolic Panel: Recent Labs  Lab 11/01/24 0622 11/02/24 0400  NA 138 133*  K 3.3* 3.5  CL 103 98  CO2 24 26  GLUCOSE 99 99  BUN 13 13  CREATININE 0.90 0.91  CALCIUM  9.8 8.9   GFR Estimated Creatinine Clearance: 77.7 mL/min (by C-G formula based on SCr of 0.91 mg/dL). Liver Function Tests: Recent Labs  Lab 11/01/24 0622  AST 21  ALT 16   ALKPHOS 105  BILITOT 0.6  PROT 6.4*  ALBUMIN 4.1   No results for input(s): LIPASE, AMYLASE in the last 168 hours. No results for input(s): AMMONIA in the last 168 hours. Coagulation profile No results for input(s): INR, PROTIME in the last 168 hours.  Cardiac Enzymes: No results for input(s): CKTOTAL, CKMB, CKMBINDEX, TROPONINI in the last 168 hours. BNP: Invalid input(s): POCBNP CBG: Recent Labs  Lab 11/01/24 0602 11/01/24 1021  GLUCAP 98 84   D-Dimer No results for input(s): DDIMER in the last 72 hours. Hgb A1c No results for input(s): HGBA1C in the last 72 hours. Lipid Profile No results for input(s): CHOL, HDL, LDLCALC, TRIG, CHOLHDL, LDLDIRECT in the last 72 hours. Thyroid function studies No results for input(s): TSH, T4TOTAL, T3FREE, THYROIDAB in the last 72 hours.  Invalid input(s): FREET3 Anemia work up No results for input(s): VITAMINB12, FOLATE, FERRITIN, TIBC, IRON, RETICCTPCT in the last 72 hours. Urinalysis No results found for: COLORURINE, APPEARANCEUR, LABSPEC, PHURINE, GLUCOSEU, HGBUR, BILIRUBINUR, KETONESUR, PROTEINUR, UROBILINOGEN, NITRITE, LEUKOCYTESUR   Microbiology No results found for this or any previous visit (from the past 240 hours).     Inpatient Medications:   Scheduled Meds:  allopurinol   300 mg Oral QHS   amitriptyline   100 mg Oral QHS   aspirin  81 mg Oral BID   Avenova  1 application  Both Eyes BID   budesonide -formoterol   2 puff Inhalation BID   carvedilol   12.5 mg Oral BID   docusate sodium   100 mg Oral BID   fluocinonide  1 Application Topical Daily   fluticasone   1 spray Each Nare Daily   levothyroxine   125 mcg Oral QAC breakfast   loratadine  10 mg Oral Daily   magnesium oxide  400 mg Oral Daily  mexiletine  150 mg Oral BID   nystatin -triamcinolone    Topical BID   pantoprazole   40 mg Oral Daily   pentosan polysulfate  200 mg Oral BID    rosuvastatin   5 mg Oral QHS   triamterene -hydrochlorothiazide   1 tablet Oral Daily   venlafaxine  XR  150 mg Oral Q breakfast   Continuous Infusions:  lactated ringers  Stopped (11/02/24 1305)     Radiological Exams on Admission: No results found.  Impression/Recommendations  Orthostatic hypotension History of hypertension History of Mnire's disease - Orthostatic hypotension probably aggravated by decreased mobility and increased pain.  Blood pressure dropped to the 50s on standing today.  Hold Coreg , triamterene  and hydrochlorothiazide  today.  Will give normal saline bolus and put her on gentle hydration.  Monitor blood pressure and orthostatic vitals.  Will check labs as well.  History of PVCs - Continue mexiletine.  Outpatient follow-up with cardiology.  Hold Coreg   Hyperlipidemia - Continue statin  Status post left total knee arthroplasty - Management as per primary orthopedics team  Hypothyroidism -Continue levothyroxine   Diabetes mellitus type 2 -Blood sugars currently controlled.  Outpatient follow-up with PCP  Hyponatremia - Check labs now  Hypokalemia - Resolved  Depression - Continue venlafaxine  XR and amitriptyline   Asthma -Stable.  Continue current inhaled regimen   Thank you for this consultation.  Our [facility] hospitalist team will follow the patient with you.    Sophie Mao M.D. [facility] 11/04/2024, 12:38 PM

## 2024-11-05 DIAGNOSIS — M1712 Unilateral primary osteoarthritis, left knee: Secondary | ICD-10-CM | POA: Diagnosis not present

## 2024-11-05 MED ORDER — SODIUM CHLORIDE 0.9 % IV BOLUS
500.0000 mL | Freq: Once | INTRAVENOUS | Status: AC
  Administered 2024-11-05: 500 mL via INTRAVENOUS

## 2024-11-05 NOTE — Progress Notes (Signed)
   11/05/24 0954  Assess: MEWS Score  BP (!) 77/52  MAP (mmHg) (!) 59  Pulse Rate (!) 107  Assess: MEWS Score  MEWS Temp 0  MEWS Systolic 2  MEWS Pulse 1  MEWS RR 0  MEWS LOC 0  MEWS Score 3  MEWS Score Color Yellow  Assess: if the MEWS score is Yellow or Red  Were vital signs accurate and taken at a resting state? No, vital signs rechecked  Provider Notification  Provider Name/Title Alelk, RN  Date Provider Notified 11/05/24  Time Provider Notified 1015  Method of Notification Page  Notification Reason Other (Comment) (vital sign abnormal)  Provider response See new orders  Date of Provider Response 11/05/24  Time of Provider Response 1200  Assess: SIRS CRITERIA  SIRS Temperature  0  SIRS Respirations  0  SIRS Pulse 1  SIRS WBC 0  SIRS Score Sum  1

## 2024-11-05 NOTE — Progress Notes (Signed)
 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 Subsequently, hospital course complicated by increasing pain and dizziness with inability to work with PT. TR[facility] was consulted on 11/04/2024 for orthostatic hypotension.  She was started on IV fluids.  Assessment & Plan:   Orthostatic hypotension History of hypertension History of Mnire's disease - Orthostatic hypotension probably aggravated by decreased mobility and increased pain.  Blood pressure dropped to the 50s on standing on 11/04/2024.  Coreg , triamterene  and hydrochlorothiazide  on hold for now.  Currently on gentle hydration.  DC IV fluids.  Monitor blood pressure and orthostatic vitals today.  Labs look okay.  If orthostatic vitals look okay, patient can be discharged: Recommend to hold Coreg  till reevaluation by PCP.  Triamterene /hydrochlorothiazide  can be resumed if blood pressure remains high.   History of PVCs - Continue mexiletine.  Outpatient follow-up with cardiology.  Coreg  on hold.  Hyperlipidemia - Continue statin   Status post left total knee arthroplasty - Management as per primary orthopedics team   Hypothyroidism -Continue levothyroxine    Diabetes mellitus type 2 -Blood sugars currently controlled.  Outpatient follow-up with PCP   Hyponatremia - Mild.  Encourage oral intake.    Hypokalemia - Resolved   Depression - Continue venlafaxine  XR and amitriptyline    Asthma -Stable.  Continue current inhaled regimen      Subjective: Patient seen and examined at bedside.  Feels better.  Denies any current dizziness, abdominal pain,  chest pain or shortness of breath.  Objective: Vitals:   11/04/24 0847 11/04/24 1334 11/04/24 2113 11/05/24 0627  BP: 132/61 (!) 121/53 115/61 (!) 139/56  Pulse: 88 87 86 86  Resp:  20 16 16   Temp:  98.2 F (36.8 C) 98.9 F (37.2 C) 98 F (36.7 C)  TempSrc:  Oral Oral Oral  SpO2:  97% 100% 97%  Weight:      Height:        Intake/Output Summary (Last 24 hours) at 11/05/2024 0806 Last data filed at 11/05/2024 9262 Gross per 24 hour  Intake 1597.39 ml  Output 450 ml  Net 1147.39 ml   Filed Weights   11/01/24 1302  Weight: 110.7 kg    Examination:  General exam: Appears calm and comfortable  Respiratory system: Bilateral decreased breath sounds at bases Cardiovascular system: S1 & S2 heard, Rate controlled currently Gastrointestinal system: Abdomen is morbidly obese, nondistended, soft and nontender. Normal bowel sounds heard. Extremities: Left knee dressing present  Central nervous system: Alert and oriented. No focal neurological deficits. Moving extremities Skin: No rashes, lesions or ulcers Psychiatry: Judgement and insight appear normal. Mood & affect appropriate.     Data Reviewed: I have personally reviewed following labs and imaging studies  CBC: Recent Labs  Lab 11/01/24 0622 11/02/24 0400 11/03/24 0345 11/04/24 1355  WBC 6.5 6.8 7.2 7.1  NEUTROABS 4.0  --   --  5.5  HGB 13.5 11.9* 11.8* 10.5*  HCT 42.5 35.8* 35.0* 31.1*  MCV 98.6 98.9 97.0 96.6  PLT 210 160 172 193   Basic Metabolic Panel: Recent Labs  Lab 11/01/24 0622 11/02/24 0400 11/04/24 1355  NA 138 133* 132*  K 3.3* 3.5 3.8  CL 103 98 97*  CO2  24 26 25   GLUCOSE 99 99 135*  BUN 13 13 8   CREATININE 0.90 0.91 0.94  CALCIUM  9.8 8.9 9.2  MG  --   --  2.0   GFR: Estimated Creatinine Clearance: 75.3 mL/min (by C-G formula based on SCr of 0.94 mg/dL). Liver Function Tests: Recent Labs  Lab 11/01/24 0622 11/04/24 1355  AST 21 24  ALT 16 17  ALKPHOS 105 91  BILITOT 0.6 0.4  PROT  6.4* 5.5*  ALBUMIN 4.1 3.2*   No results for input(s): LIPASE, AMYLASE in the last 168 hours. No results for input(s): AMMONIA in the last 168 hours. Coagulation Profile: No results for input(s): INR, PROTIME in the last 168 hours. Cardiac Enzymes: No results for input(s): CKTOTAL, CKMB, CKMBINDEX, TROPONINI in the last 168 hours. BNP (last 3 results) No results for input(s): PROBNP in the last 8760 hours. HbA1C: No results for input(s): HGBA1C in the last 72 hours. CBG: Recent Labs  Lab 11/01/24 0602 11/01/24 1021  GLUCAP 98 84   Lipid Profile: No results for input(s): CHOL, HDL, LDLCALC, TRIG, CHOLHDL, LDLDIRECT in the last 72 hours. Thyroid Function Tests: No results for input(s): TSH, T4TOTAL, FREET4, T3FREE, THYROIDAB in the last 72 hours. Anemia Panel: No results for input(s): VITAMINB12, FOLATE, FERRITIN, TIBC, IRON, RETICCTPCT in the last 72 hours. Sepsis Labs: No results for input(s): PROCALCITON, LATICACIDVEN in the last 168 hours.  No results found for this or any previous visit (from the past 240 hours).       Radiology Studies: No results found.      Scheduled Meds:  allopurinol   300 mg Oral QHS   amitriptyline   100 mg Oral QHS   aspirin  81 mg Oral BID   Avenova  1 application  Both Eyes BID   budesonide -formoterol   2 puff Inhalation BID   docusate sodium   100 mg Oral BID   fluocinonide  1 Application Topical Daily   fluticasone   1 spray Each Nare Daily   levothyroxine   125 mcg Oral QAC breakfast   loratadine  10 mg Oral Daily   magnesium oxide  400 mg Oral Daily   mexiletine  150 mg Oral BID   nystatin -triamcinolone    Topical BID   pantoprazole   40 mg Oral Daily   pentosan polysulfate  200 mg Oral BID   rosuvastatin   5 mg Oral QHS   venlafaxine  XR  150 mg Oral Q breakfast   Continuous Infusions:  sodium chloride  75 mL/hr at 11/05/24 0737          Sophie Mao, MD [facility] 11/05/2024, 8:06 AM

## 2024-11-05 NOTE — Progress Notes (Signed)
 Physical Therapy Treatment Patient Details Name: Kelly Thornton MRN: [id number] DOB: Mar 07, 1961 Today's Date: 11/05/2024   History of Present Illness Pt is 63 yo female s/p L TKA on 11/01/24.  Pt with hx including but not limited to adrenal mass, Raynaud's disease, orthostatic BP, hearing loss bil (R worse than L), obesity, DM2, arthritis, anxiety, fibromyalgia, HLD, HTN, reports Meniere's    PT Comments  Pt's knee pain, ROM, and strength is progressing well, but she remains limited by orthostatic hypotension with BP taken manually.  Took transfers slowly and performed exercises prior to progressing to next position, pt still with lightheadedness although not as severe as yesterday.  Additionally, with BP meds stopped reports her Meneirs has worsened again and is also dizzy in standing.  She was able to walk at EOB some and transferred to the chair with improved BP once reclined.  Notified RN.   Orthostatic vitals.    11/05/24 1500  Vital Signs     BP Location Right Arm  BP Method Manual  Patient Position (if appropriate) Orthostatic Vitals  Orthostatic Lying   BP- Lying 112/50  Orthostatic Sitting  BP- Sitting 108/50  Orthostatic Standing at 0 minutes  BP- Standing at 0 minutes (!) 80/50 Pt with reports of lightheadedness but able to stay standing and marched in place and back and forth at bedside  Orthostatic Standing at 3 minutes  BP- Standing at 3 minutes (!) 72/42, returned to chair (All taken manual)  Pulse- Standing at 3 minutes 120     If plan is discharge home, recommend the following: A little help with walking and/or transfers;A little help with bathing/dressing/bathroom;Assistance with cooking/housework;Help with stairs or ramp for entrance   Can travel by private vehicle        Equipment Recommendations  Rolling walker (2 wheels)    Recommendations for Other Services       Precautions / Restrictions Precautions Precautions: Fall;Knee Precaution/Restrictions  Comments: orthostatic bp Restrictions LLE Weight Bearing Per Provider Order: Weight bearing as tolerated     Mobility  Bed Mobility Overal bed mobility: Needs Assistance Bed Mobility: Supine to Sit     Supine to sit: Supervision          Transfers Overall transfer level: Needs assistance Equipment used: Rolling walker (2 wheels) Transfers: Sit to/from Stand, Bed to chair/wheelchair/BSC Sit to Stand: Contact guard assist, From elevated surface   Step pivot transfers: +2 safety/equipment, Contact guard assist       General transfer comment: STS x 2; required bed elevated but then min A; +2 safety due to orthostatic bp    Ambulation/Gait Ambulation/Gait assistance: Contact guard assist, +2 safety/equipment Gait Distance (Feet): 20 Feet Assistive device: Rolling walker (2 wheels) Gait Pattern/deviations: Step-to pattern, Decreased weight shift to left Gait velocity: decreased     General Gait Details: Back and forth at EOB during 3 mins standing for orthostatic vitals; also marched in place; needing CGA for safety   Stairs             Wheelchair Mobility     Tilt Bed    Modified Rankin (Stroke Patients Only)       Balance Overall balance assessment: Needs assistance Sitting-balance support: No upper extremity supported, Feet supported Sitting balance-Leahy Scale: Good     Standing balance support: Bilateral upper extremity supported, Reliant on assistive device for balance Standing balance-Leahy Scale: Poor Standing balance comment: Steady with walker, pt swaying when single UE removed from RW for BP  Communication Communication Factors Affecting Communication: Hearing impaired  Cognition Arousal: Alert Behavior During Therapy: WFL for tasks assessed/performed   PT - Cognitive impairments: No apparent impairments                                Cueing    Exercises Total Joint Exercises Ankle  Circles/Pumps: AROM, Both, 10 reps, Supine Quad Sets: AROM, Both, 10 reps, Supine Heel Slides: AAROM, Left, 10 reps, Supine Hip ABduction/ADduction: AAROM, Left, 10 reps, Supine Long Arc Quad: AAROM, Left, 10 reps, Seated Knee Flexion: AAROM, Left, 10 reps, Seated Goniometric ROM: L knee 5 to 60    General Comments        Pertinent Vitals/Pain Pain Assessment Pain Assessment: 0-10 Pain Score: 3  Pain Location: L knee Pain Descriptors / Indicators: Sore (reports knee pain doing well, reports has needed limited pain meds) Pain Intervention(s): Limited activity within patient's tolerance, Monitored during session, Repositioned, Ice applied    Home Living                          Prior Function            PT Goals (current goals can now be found in the care plan section) Progress towards PT goals: Progressing toward goals (limited by orthostatic hypotension, but knee progressing well with strength and pain control)    Frequency    7X/week      PT Plan      Co-evaluation              AM-PAC PT 6 Clicks Mobility   Outcome Measure  Help needed turning from your back to your side while in a flat bed without using bedrails?: A Little Help needed moving from lying on your back to sitting on the side of a flat bed without using bedrails?: A Little Help needed moving to and from a bed to a chair (including a wheelchair)?: A Little Help needed standing up from a chair using your arms (e.g., wheelchair or bedside chair)?: A Little Help needed to walk in hospital room?: A Little Help needed climbing 3-5 steps with a railing? : A Lot 6 Click Score: 17    End of Session Equipment Utilized During Treatment: Gait belt Activity Tolerance: Treatment limited secondary to medical complications (Comment) Patient left: with call bell/phone within reach;with family/visitor present;in chair Nurse Communication: Mobility status;Other (comment) (bp) PT Visit Diagnosis:  Other abnormalities of gait and mobility (R26.89);Muscle weakness (generalized) (M62.81)     Time: 8585-8549 PT Time Calculation (min) (ACUTE ONLY): 36 min  Charges:    $Therapeutic Exercise: 8-22 mins $Therapeutic Activity: 8-22 mins PT General Charges $$ ACUTE PT VISIT: 1 Visit                     Benjiman, PT Acute Rehab [facility] [phone]    Benjiman VEAR Mulberry 11/05/2024, 3:25 PM

## 2024-11-05 NOTE — Progress Notes (Signed)
    4 Days Post-Op Procedure(s) (LRB): ARTHROPLASTY, KNEE, TOTAL (Left)  Subjective:  Patient reports pain as mild-to-moderate.  No overnight events.  Orthostatic BP worsened yesterday to around 50 systolic, hospitalist was consulted -- see separate note.  Again unfortunately did not clear PT yesterday, though more hopeful for dispo today.  No numbness or tingling of surgical leg.  Dressing was changed yesterday due to water  leaking from ice packs.  Once medically stablized, likely dispo home.  Yesterday's total administered Morphine Milligram Equivalents: 45   Objective:   VITALS:   Vitals:   11/04/24 0847 11/04/24 1334 11/04/24 2113 11/05/24 0627  BP: 132/61 (!) 121/53 115/61 (!) 139/56  Pulse: 88 87 86 86  Resp:  20 16 16   Temp:  98.2 F (36.8 C) 98.9 F (37.2 C) 98 F (36.7 C)  TempSrc:  Oral Oral Oral  SpO2:  97% 100% 97%  Weight:      Height:        Resting in bed comfortably in NAD Sensation intact distally Intact pulses distally Dorsiflexion/Plantar flexion intact Incision: dressing C/D/I Compartment soft Wiggles toes appropriately   Lab Results  Component Value Date   WBC 7.1 11/04/2024   HGB 10.5 (L) 11/04/2024   HCT 31.1 (L) 11/04/2024   MCV 96.6 11/04/2024   PLT 193 11/04/2024   BMET    Component Value Date/Time   NA 132 (L) 11/04/2024 1355   NA 141 03/08/2024 0831   K 3.8 11/04/2024 1355   CL 97 (L) 11/04/2024 1355   CO2 25 11/04/2024 1355   GLUCOSE 135 (H) 11/04/2024 1355   BUN 8 11/04/2024 1355   BUN 16 03/08/2024 0831   CREATININE 0.94 11/04/2024 1355   CALCIUM  9.2 11/04/2024 1355   EGFR 71 03/08/2024 0831   GFRNONAA >60 11/04/2024 1355    Xray: TKA components in good position no adverse features  Assessment/Plan: 4 Days Post-Op   Principal Problem:   Primary osteoarthritis of left knee Active Problems:   Primary osteoarthritis of right knee  S/p L TKA 11/01/24  Appreciative of hospitalist guidance with orthostatic  management.  Once improved, patient hopeful for dispo.  Post op recs: WB: WBAT Abx: ancef Imaging: PACU xrays DVT prophylaxis: Aspirin 81mg  BID x4 weeks Follow up: 2 weeks after surgery for a wound check with Dr. Edna at [facility].  Address: [address], [place], [place] [address]  Office Phone: [phone]     Kelly Thornton 11/05/2024, 7:04 AM    Contact information:   Weekdays 7am-5pm epic message Dr. Edna, or call office for patient follow up: [phone] After hours and holidays please check [email or web address] for group call information for [facility]

## 2024-11-05 NOTE — Progress Notes (Signed)
 PT Cancellation Note  Patient Details Name: Kelly Thornton MRN: [id number] DOB: Jan 13, 1961   Cancelled Treatment:    Reason Eval/Treat Not Completed: Medical issues which prohibited therapy Spoke with RN and pt's orthostatic BP was just 72/55 and getting bolus.  Will f/u in afternoon. Kelly, PT Acute Rehab [facility] [phone]   Kelly Thornton 11/05/2024, 10:20 AM

## 2024-11-05 NOTE — Plan of Care (Signed)
   Problem: Activity: Goal: Risk for activity intolerance will decrease Outcome: Progressing   Problem: Nutrition: Goal: Adequate nutrition will be maintained Outcome: Progressing   Problem: Pain Managment: Goal: General experience of comfort will improve and/or be controlled Outcome: Progressing   Problem: Safety: Goal: Ability to remain free from injury will improve Outcome: Progressing

## 2024-11-06 DIAGNOSIS — M1712 Unilateral primary osteoarthritis, left knee: Secondary | ICD-10-CM | POA: Diagnosis not present

## 2024-11-06 MED ORDER — POLYETHYLENE GLYCOL 3350 17 G PO PACK
17.0000 g | PACK | Freq: Once | ORAL | Status: AC
  Administered 2024-11-06: 17 g via ORAL
  Filled 2024-11-06: qty 1

## 2024-11-06 MED ORDER — NYSTATIN 100000 UNIT/ML MT SUSP
5.0000 mL | Freq: Four times a day (QID) | OROMUCOSAL | Status: DC
  Administered 2024-11-06 – 2024-11-07 (×4): 500000 [IU] via ORAL
  Filled 2024-11-06 (×4): qty 5

## 2024-11-06 NOTE — TOC Progression Note (Signed)
 Transition of Care [facility]) - Progression Note    Patient Details  Name: NEALA MIGGINS MRN: [id number] Date of Birth: 1961-11-15  Transition of Care [facility]) CM/SW Contact  Sonda Manuella Quill, RN Phone Number: 11/06/2024, 9:30 AM  Clinical Narrative:    D/C orders received; DME has been delivered to room; pt will attend OPPT; no IP CM needs.     Barriers to Discharge: No Barriers Identified               Expected Discharge Plan and Services         Expected Discharge Date: 11/05/24                                     Social Drivers of Health (SDOH) Interventions SDOH Screenings   Food Insecurity: No Food Insecurity (11/01/2024)  Housing: Low Risk  (11/01/2024)  Transportation Needs: No Transportation Needs (11/01/2024)  Utilities: Not At Risk (11/01/2024)  Depression (PHQ2-9): Low Risk  (03/03/2024)  Social Connections: Unknown (04/30/2023)   Received from [facility]  Tobacco Use: Low Risk  (11/01/2024)    Readmission Risk Interventions     No data to display

## 2024-11-06 NOTE — Progress Notes (Signed)
 Patient developed rash all over their skin, Blaine, PA notified at 1344. Ordered to continue to monitor and apply Triamcinalone cream to affected area.

## 2024-11-06 NOTE — Progress Notes (Signed)
 Physical Therapy Treatment Patient Details Name: Kelly Thornton MRN: [id number] DOB: [birth year] Today's Date: 11/06/2024   History of Present Illness Pt is 63 yo female s/p L TKA on 11/01/24.  Ongoing hospital stay due to orthostatic hypotension. Pt with hx including but not limited to adrenal mass, Raynaud's disease, orthostatic BP, hearing loss bil (R worse than L), obesity, DM2, arthritis, anxiety, fibromyalgia, HLD, HTN, reports Meniere's    PT Comments  Pt is POD # 5 and is progressing well. She is ambulating 120' with RW and supervision. Pt's orthostatic BP has improved and she is no longer lightheaded.  Only mild dizziness from Meneirs which she has at baseline.  Pt demonstrates safe gait & transfers in order to return home from PT perspective once discharged by MD.  While in hospital, will continue to benefit from PT for skilled therapy to advance mobility and exercises.     11/06/24 1500  Orthostatic Lying   BP- Lying 125/56  Orthostatic Sitting  BP- Sitting 134/84  Orthostatic Standing at 0 minutes  BP- Standing at 0 minutes 114/73      If plan is discharge home, recommend the following: A little help with walking and/or transfers;A little help with bathing/dressing/bathroom;Assistance with cooking/housework;Help with stairs or ramp for entrance   Can travel by private vehicle        Equipment Recommendations  Rolling walker (2 wheels)    Recommendations for Other Services       Precautions / Restrictions Precautions Precautions: Fall;Knee Restrictions LLE Weight Bearing Per Provider Order: Weight bearing as tolerated     Mobility  Bed Mobility Overal bed mobility: Needs Assistance Bed Mobility: Sit to Supine     Supine to sit: Supervision Sit to supine: Supervision   General bed mobility comments: Used gait belt to assist LE    Transfers Overall transfer level: Needs assistance Equipment used: Rolling walker (2 wheels) Transfers: Sit to/from Stand Sit  to Stand: Supervision           General transfer comment: From chair    Ambulation/Gait Ambulation/Gait assistance: Contact guard assist, Supervision Gait Distance (Feet): 120 Feet Assistive device: Rolling walker (2 wheels) Gait Pattern/deviations: Step-through pattern, Decreased stride length, Decreased weight shift to left Gait velocity: decreased but functional     General Gait Details: Progressed to supervision, denies dizziness or lightheadedness, steady gait with slight antalgic pattern   Stairs Stairs:  (level entry home)           Wheelchair Mobility     Tilt Bed    Modified Rankin (Stroke Patients Only)       Balance Overall balance assessment: Needs assistance Sitting-balance support: No upper extremity supported, Feet supported Sitting balance-Leahy Scale: Good     Standing balance support: Bilateral upper extremity supported, Reliant on assistive device for balance Standing balance-Leahy Scale: Fair Standing balance comment: Able to static stand wtihout support with decreased swaying today                            Communication    Cognition Arousal: Alert Behavior During Therapy: WFL for tasks assessed/performed   PT - Cognitive impairments: No apparent impairments                                Cueing    Exercises Total Joint Exercises Ankle Circles/Pumps: AROM, Both, 10 reps, Supine Quad Sets: AROM, Both,  10 reps, Supine Heel Slides: AAROM, Left, 10 reps, Supine, Limitations Heel Slides Limitations: self assist gait belt Hip ABduction/ADduction: Left, 10 reps, Supine, AROM Long Arc Quad: Left, 10 reps, Seated, AROM Knee Flexion: AAROM, Left, 10 reps, Seated Goniometric ROM: L knee 3 to 70    General Comments General comments (skin integrity, edema, etc.): Pt with rash throughout body - nursing and MD aware   Educated on safe ice use, no pivots, car transfers, resting with leg straight, and TED hose during  day. Also, encouraged walking every 1-2 hours during day. Educated on HEP with focus on mobility the first weeks. Discussed doing exercises within pain control and if pain increasing could decreased ROM, reps, and stop exercises as needed. Encouraged to perform quad sets and ankle pumps frequently for blood flow and to promote full knee extension.    Pertinent Vitals/Pain Pain Assessment Pain Assessment: 0-10 Pain Score: 2  Pain Location: L knee Pain Descriptors / Indicators: Sore Pain Intervention(s): Limited activity within patient's tolerance, Monitored during session, Premedicated before session, Repositioned    Home Living                          Prior Function            PT Goals (current goals can now be found in the care plan section) Progress towards PT goals: Progressing toward goals    Frequency    7X/week      PT Plan      Co-evaluation              AM-PAC PT 6 Clicks Mobility   Outcome Measure  Help needed turning from your back to your side while in a flat bed without using bedrails?: A Little Help needed moving from lying on your back to sitting on the side of a flat bed without using bedrails?: A Little Help needed moving to and from a bed to a chair (including a wheelchair)?: A Little Help needed standing up from a chair using your arms (e.g., wheelchair or bedside chair)?: A Little Help needed to walk in hospital room?: A Little Help needed climbing 3-5 steps with a railing? : A Little 6 Click Score: 18    End of Session Equipment Utilized During Treatment: Gait belt Activity Tolerance: Patient tolerated treatment well Patient left: with call bell/phone within reach;with family/visitor present;in bed Nurse Communication: Mobility status PT Visit Diagnosis: Other abnormalities of gait and mobility (R26.89);Muscle weakness (generalized) (M62.81)     Time: 8489-8453 PT Time Calculation (min) (ACUTE ONLY): 36 min  Charges:     $Gait Training: 8-22 mins PT General Charges $$ ACUTE PT VISIT: 1 Visit                     Kelly, PT Acute Rehab Services [facility] [phone]    Kelly Thornton 11/06/2024, 4:05 PM

## 2024-11-06 NOTE — Plan of Care (Signed)
   Problem: Coping: Goal: Level of anxiety will decrease Outcome: Progressing   Problem: Pain Managment: Goal: General experience of comfort will improve and/or be controlled Outcome: Progressing   Problem: Safety: Goal: Ability to remain free from injury will improve Outcome: Progressing

## 2024-11-06 NOTE — Plan of Care (Signed)
  Problem: Pain Managment: Goal: General experience of comfort will improve and/or be controlled Outcome: Progressing   Problem: Activity: Goal: Risk for activity intolerance will decrease Outcome: Progressing   Problem: Skin Integrity: Goal: Risk for impaired skin integrity will decrease Outcome: Progressing

## 2024-11-06 NOTE — Progress Notes (Signed)
 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 Subsequently, hospital course complicated by increasing pain and dizziness with inability to work with PT. [facility] was consulted on 11/04/2024 for orthostatic hypotension.  She was started on IV fluids.  Assessment & Plan:   Orthostatic hypotension History of hypertension History of Mnire's disease - Continues to have intermittent orthostatic hypotension.  Trying to avoid midodrine as much as possible.  Coreg , triamterene  and hydrochlorothiazide  on hold for now.  Received IV fluid bolus again yesterday.  Monitor blood pressure and orthostatic vitals today.  Labs looked okay.  If orthostatic vitals look okay, patient can be discharged: Recommend to hold Coreg , triamterene /hydrochlorothiazide  till reevaluation by PCP.     History of PVCs - Continue mexiletine.  Outpatient follow-up with cardiology.  Coreg  on hold.  Hyperlipidemia - Continue statin   Status post left total knee arthroplasty - Management as per primary orthopedics team   Hypothyroidism -Continue levothyroxine    Diabetes mellitus type 2 -Blood sugars currently controlled.  Outpatient follow-up with PCP   Hyponatremia - No labs today.  Encourage oral intake.    Hypokalemia - Resolved   Depression - Continue venlafaxine  XR and amitriptyline    Asthma -Stable.  Continue current inhaled regimen      Subjective: Patient seen and examined at bedside.  Feels better.  Has not gotten out of bed today.  Denies chest pain, shortness of breath.  Has intermittent left knee pain. Objective: Vitals:    11/05/24 2356 11/06/24 0425 11/06/24 0935 11/06/24 0953  BP: (!) 143/74 129/65  (!) 119/52  Pulse: 78 83  90  Resp: 18 17  19   Temp: 98.5 F (36.9 C) 97.9 F (36.6 C)  98.6 F (37 C)  TempSrc: Oral Oral    SpO2: 95% 98% 99% 98%  Weight:      Height:        Intake/Output Summary (Last 24 hours) at 11/06/2024 1005 Last data filed at 11/06/2024 0900 Gross per 24 hour  Intake 1460 ml  Output 2025 ml  Net -565 ml   Filed Weights   11/01/24 1302  Weight: 110.7 kg    Examination:  General: On room air.  No distress.  respiratory: Decreased breath sounds at bases bilaterally with some crackles CVS: Currently rate controlled; S1-S2 heard  abdominal: Soft, obese, nontender, slightly distended, no organomegaly; normal bowel sounds are heard  extremities: Left knee dressing present.    Data Reviewed: I have personally reviewed following labs and imaging studies  CBC: Recent Labs  Lab 11/01/24 0622 11/02/24 0400 11/03/24 0345 11/04/24 1355  WBC 6.5 6.8 7.2 7.1  NEUTROABS 4.0  --   --  5.5  HGB 13.5 11.9* 11.8* 10.5*  HCT 42.5 35.8* 35.0* 31.1*  MCV 98.6 98.9 97.0 96.6  PLT 210 160 172 193   Basic Metabolic Panel: Recent Labs  Lab 11/01/24 0622 11/02/24 0400 11/04/24 1355  NA 138 133* 132*  K 3.3* 3.5 3.8  CL 103 98 97*  CO2 24 26 25   GLUCOSE 99 99 135*  BUN 13 13 8   CREATININE 0.90 0.91 0.94  CALCIUM  9.8 8.9 9.2  MG  --   --  2.0   GFR:  Estimated Creatinine Clearance: 75.3 mL/min (by C-G formula based on SCr of 0.94 mg/dL). Liver Function Tests: Recent Labs  Lab 11/01/24 0622 11/04/24 1355  AST 21 24  ALT 16 17  ALKPHOS 105 91  BILITOT 0.6 0.4  PROT 6.4* 5.5*  ALBUMIN 4.1 3.2*   No results for input(s): LIPASE, AMYLASE in the last 168 hours. No results for input(s): AMMONIA in the last 168 hours. Coagulation Profile: No results for input(s): INR, PROTIME in the last 168 hours. Cardiac Enzymes: No results for input(s): CKTOTAL,  CKMB, CKMBINDEX, TROPONINI in the last 168 hours. BNP (last 3 results) No results for input(s): PROBNP in the last 8760 hours. HbA1C: No results for input(s): HGBA1C in the last 72 hours. CBG: Recent Labs  Lab 11/01/24 0602 11/01/24 1021  GLUCAP 98 84   Lipid Profile: No results for input(s): CHOL, HDL, LDLCALC, TRIG, CHOLHDL, LDLDIRECT in the last 72 hours. Thyroid Function Tests: No results for input(s): TSH, T4TOTAL, FREET4, T3FREE, THYROIDAB in the last 72 hours. Anemia Panel: No results for input(s): VITAMINB12, FOLATE, FERRITIN, TIBC, IRON, RETICCTPCT in the last 72 hours. Sepsis Labs: No results for input(s): PROCALCITON, LATICACIDVEN in the last 168 hours.  No results found for this or any previous visit (from the past 240 hours).       Radiology Studies: No results found.      Scheduled Meds:  allopurinol   300 mg Oral QHS   amitriptyline   100 mg Oral QHS   aspirin  81 mg Oral BID   Avenova  1 application  Both Eyes BID   budesonide -formoterol   2 puff Inhalation BID   docusate sodium   100 mg Oral BID   fluocinonide  1 Application Topical Daily   fluticasone   1 spray Each Nare Daily   levothyroxine   125 mcg Oral QAC breakfast   loratadine  10 mg Oral Daily   magnesium oxide  400 mg Oral Daily   mexiletine  150 mg Oral BID   nystatin -triamcinolone    Topical BID   pantoprazole   40 mg Oral Daily   pentosan polysulfate  200 mg Oral BID   rosuvastatin   5 mg Oral QHS   venlafaxine  XR  150 mg Oral Q breakfast   Continuous Infusions:          Sophie Mao, MD [facility] 11/06/2024, 10:05 AM

## 2024-11-06 NOTE — Progress Notes (Signed)
    5 Days Post-Op Procedure(s) (LRB): ARTHROPLASTY, KNEE, TOTAL (Left)  Subjective:  Patient reports pain as well controlled at this time.  No overnight events. No hypotension when resting yesterday.  Orthostatic BP continued yesterday 80/50 and 72/42.  hospitalist was consulted -- see separate note.  Dd not clear PT yesterday, though more hopeful for dispo today.  Once medically stablized, likely dispo home.  Yesterday's total administered Morphine Milligram Equivalents: 15   Objective:   VITALS:   Vitals:   11/05/24 1944 11/05/24 2033 11/05/24 2356 11/06/24 0425  BP:  (!) 151/67 (!) 143/74 129/65  Pulse:  89 78 83  Resp:  18 18 17   Temp:  98.9 F (37.2 C) 98.5 F (36.9 C) 97.9 F (36.6 C)  TempSrc:  Oral Oral Oral  SpO2: 97% 97% 95% 98%  Weight:      Height:        Resting in bed comfortably in NAD Sensation intact distally Intact pulses distally Dorsiflexion/Plantar flexion intact Incision: dressing C/D/I Compartment soft Wiggles toes appropriately   Lab Results  Component Value Date   WBC 7.1 11/04/2024   HGB 10.5 (L) 11/04/2024   HCT 31.1 (L) 11/04/2024   MCV 96.6 11/04/2024   PLT 193 11/04/2024   BMET    Component Value Date/Time   NA 132 (L) 11/04/2024 1355   NA 141 03/08/2024 0831   K 3.8 11/04/2024 1355   CL 97 (L) 11/04/2024 1355   CO2 25 11/04/2024 1355   GLUCOSE 135 (H) 11/04/2024 1355   BUN 8 11/04/2024 1355   BUN 16 03/08/2024 0831   CREATININE 0.94 11/04/2024 1355   CALCIUM  9.2 11/04/2024 1355   EGFR 71 03/08/2024 0831   GFRNONAA >60 11/04/2024 1355    Xray: TKA components in good position no adverse features  Assessment/Plan: 5 Days Post-Op   Principal Problem:   Primary osteoarthritis of left knee Active Problems:   Primary osteoarthritis of right knee  S/p L TKA 11/01/24  Appreciative of hospitalist guidance with orthostatic management.  Once improved, patient hopeful for dispo. Will see how she does with PT today, if  doing well with no hypotension ok to d/c home today.   Post op recs: WB: WBAT Abx: ancef Imaging: PACU xrays DVT prophylaxis: Aspirin 81mg  BID x4 weeks Follow up: 2 weeks after surgery for a wound check with Dr. Edna at [facility].  Address: [address], [place], [place] [address]  Office Phone: [phone]    CARRELL PALMATIER 11/06/2024, 9:05 AM    Contact information:   Weekdays 7am-5pm epic message Dr. Edna, or call office for patient follow up: [phone] After hours and holidays please check [email or web address] for group call information for [facility]

## 2024-11-06 NOTE — Progress Notes (Signed)
 Physical Therapy Treatment Patient Details Name: Kelly Thornton MRN: [id number] DOB: 03-26-1961 Today's Date: 11/06/2024   History of Present Illness Pt is 63 yo female s/p L TKA on 11/01/24.  Ongoing hospital stay due to orthostatic hypotension. Pt with hx including but not limited to adrenal mass, Raynaud's disease, orthostatic BP, hearing loss bil (R worse than L), obesity, DM2, arthritis, anxiety, fibromyalgia, HLD, HTN, reports Meniere's    PT Comments  Pt did still have a drop in BP with standing but asymptomatic and not as severe so proceeded with ambulation with chair follow.  Pt ambulated 47' with RW and CGA for safety.  Pt continues to progress well with TKA , just has orthostatic BP that limits.  Plan to f/u in afternoon to make sure pt remains asymptomatic.  Did remove purewick and encouraged pt to be up with nursing to bathroom or [facility] - pt agrees and happy to have purewick removed.     Orthostatic Lying   BP- Lying 113/51  Pulse- Lying 95  Orthostatic Sitting  BP- Sitting 118/61  Pulse- Sitting 95  Orthostatic Standing at 0 minutes  BP- Standing at 0 minutes 96/86  Pulse- Standing at 0 minutes 111  Orthostatic Standing at 3 minutes  BP- Standing at 3 minutes (!) 86/57 (Asymptomatic)  Pulse- Standing at 3 minutes 116   Pt's BP after ambulation with return to sitting was 150/93 with HR 116.  Pt reports Meniere's symptoms of dizziness but denies any of the lightheadedness for flushed symptoms she was feeling the past 3 days.    If plan is discharge home, recommend the following: A little help with walking and/or transfers;A little help with bathing/dressing/bathroom;Assistance with cooking/housework;Help with stairs or ramp for entrance   Can travel by private vehicle        Equipment Recommendations  Rolling walker (2 wheels)    Recommendations for Other Services       Precautions / Restrictions Precautions Precautions: Fall;Knee Precaution/Restrictions Comments:  orthostatic bp Restrictions LLE Weight Bearing Per Provider Order: Weight bearing as tolerated     Mobility  Bed Mobility Overal bed mobility: Needs Assistance Bed Mobility: Supine to Sit     Supine to sit: Supervision     General bed mobility comments: inc time and use of bed rail, cues for scooting to position    Transfers Overall transfer level: Needs assistance Equipment used: Rolling walker (2 wheels) Transfers: Sit to/from Stand Sit to Stand: From elevated surface, Contact guard assist           General transfer comment: Increased time but no assist    Ambulation/Gait Ambulation/Gait assistance: Contact guard assist, +2 safety/equipment Gait Distance (Feet): 80 Feet Assistive device: Rolling walker (2 wheels) Gait Pattern/deviations: Step-through pattern, Decreased stride length, Decreased weight shift to left Gait velocity: decreased but functional     General Gait Details: Steady gait with RW, functional speed, c/o Meneir's dizziness but no lightheadedness, sister provided chair follow if needed as pt did have drop in BP but remained asymptomatic   Stairs             Wheelchair Mobility     Tilt Bed    Modified Rankin (Stroke Patients Only)       Balance Overall balance assessment: Needs assistance Sitting-balance support: No upper extremity supported, Feet supported Sitting balance-Leahy Scale: Good     Standing balance support: Bilateral upper extremity supported, Reliant on assistive device for balance Standing balance-Leahy Scale: Poor Standing balance comment: Steady with  walker and with single UE on RW for BP                            Communication    Cognition Arousal: Alert Behavior During Therapy: WFL for tasks assessed/performed   PT - Cognitive impairments: No apparent impairments                                Cueing    Exercises Total Joint Exercises Ankle Circles/Pumps: AROM, Both, 10 reps,  Supine Quad Sets: AROM, Both, 10 reps, Supine Heel Slides: AAROM, Left, 10 reps, Supine, Limitations Heel Slides Limitations: self assist gait belt Hip ABduction/ADduction: Left, 10 reps, Supine, AROM Long Arc Quad: Left, 10 reps, Seated, AROM Knee Flexion: AAROM, Left, 10 reps, Seated Goniometric ROM: L knee 3 to 70    General Comments        Pertinent Vitals/Pain Pain Assessment Pain Assessment: 0-10 Pain Score: 2  Pain Location: L knee Pain Descriptors / Indicators: Sore Pain Intervention(s): Limited activity within patient's tolerance, Premedicated before session, Monitored during session, Repositioned    Home Living                          Prior Function            PT Goals (current goals can now be found in the care plan section) Progress towards PT goals: Progressing toward goals    Frequency    7X/week      PT Plan      Co-evaluation              AM-PAC PT 6 Clicks Mobility   Outcome Measure  Help needed turning from your back to your side while in a flat bed without using bedrails?: A Little Help needed moving from lying on your back to sitting on the side of a flat bed without using bedrails?: A Little Help needed moving to and from a bed to a chair (including a wheelchair)?: A Little Help needed standing up from a chair using your arms (e.g., wheelchair or bedside chair)?: A Little Help needed to walk in hospital room?: A Little Help needed climbing 3-5 steps with a railing? : A Lot 6 Click Score: 17    End of Session Equipment Utilized During Treatment: Gait belt Activity Tolerance: Patient tolerated treatment well Patient left: with call bell/phone within reach;with family/visitor present;in chair Nurse Communication: Mobility status (orthostatic BP but asymptomatic) PT Visit Diagnosis: Other abnormalities of gait and mobility (R26.89);Muscle weakness (generalized) (M62.81)     Time: 1010-1044 PT Time Calculation (min)  (ACUTE ONLY): 34 min  Charges:    $Gait Training: 8-22 mins $Therapeutic Exercise: 8-22 mins PT General Charges $$ ACUTE PT VISIT: 1 Visit                     Benjiman, PT Acute Rehab Services [facility] [phone]    Benjiman Kelly Thornton 11/06/2024, 11:35 AM

## 2024-11-07 MED ORDER — TRIAMCINOLONE ACETONIDE 0.1 % EX LOTN
TOPICAL_LOTION | Freq: Three times a day (TID) | CUTANEOUS | Status: DC
  Filled 2024-11-07: qty 60

## 2024-11-07 MED ORDER — NYSTATIN 100000 UNIT/ML MT SUSP: 5.0000 mL | Freq: Four times a day (QID) | OROMUCOSAL | 0 refills | Status: AC

## 2024-11-07 MED ORDER — NYSTATIN-TRIAMCINOLONE 100000-0.1 UNIT/GM-% EX CREA: TOPICAL_CREAM | Freq: Two times a day (BID) | CUTANEOUS | Status: DC | PRN

## 2024-11-07 NOTE — Plan of Care (Signed)
   Problem: Coping: Goal: Level of anxiety will decrease Outcome: Progressing   Problem: Pain Managment: Goal: General experience of comfort will improve and/or be controlled Outcome: Progressing   Problem: Safety: Goal: Ability to remain free from injury will improve Outcome: Progressing

## 2024-11-07 NOTE — Progress Notes (Addendum)
 6 Days Post-Op Procedure(s) (LRB): ARTHROPLASTY, KNEE, TOTAL (Left)  Subjective:  Patient reports pain as well controlled at this time. She developed thrush symptoms yesterday, which is common for her, doing better today. She also developed a full body rash. She has some itching today on her  back but otherwise it does not bother her. Used her triamcinolone /nystatin  cream on the rash today, she would like something easier to apply. We will try triamcinolone  lotion for better application today. She is unsure if this was due to the miralax we tried yesterday for constipation, she has many allergies but no allergies to miralax or its ingredients.   Did well with PT yesterday and otherwise would have been ready for discharge except for rash. Patient feels like this may be due to inability to take her daily zyrtec, she has had rashes like this in the past. Unfortunately, Claritin is our only option while inpatient.  Yesterday's total administered Morphine Milligram Equivalents: 22.5   Objective:   VITALS:   Vitals:   11/06/24 1421 11/06/24 2141 11/06/24 2219 11/07/24 0539  BP: (!) 116/56  137/60 135/63  Pulse: 85  87 91  Resp: 16  16 15   Temp: 98.5 F (36.9 C)  100 F (37.8 C) 98 F (36.7 C)  TempSrc:   Oral Oral  SpO2: 97% 98% 100% 94%  Weight:      Height:        Resting in bed comfortably in NAD Sensation intact distally Intact pulses distally Dorsiflexion/Plantar flexion intact Incision: dressing C/D/I Compartment soft Wiggles toes appropriately See photos of rash below. Extends across entire body and back.       Lab Results  Component Value Date   WBC 7.1 11/04/2024   HGB 10.5 (L) 11/04/2024   HCT 31.1 (L) 11/04/2024   MCV 96.6 11/04/2024   PLT 193 11/04/2024   BMET    Component Value Date/Time   NA 132 (L) 11/04/2024 1355   NA 141 03/08/2024 0831   K 3.8 11/04/2024 1355   CL 97 (L) 11/04/2024 1355   CO2 25 11/04/2024 1355   GLUCOSE 135 (H) 11/04/2024  1355   BUN 8 11/04/2024 1355   BUN 16 03/08/2024 0831   CREATININE 0.94 11/04/2024 1355   CALCIUM  9.2 11/04/2024 1355   EGFR 71 03/08/2024 0831   GFRNONAA >60 11/04/2024 1355    Xray: TKA components in good position no adverse features  Assessment/Plan: 6 Days Post-Op   Principal Problem:   Primary osteoarthritis of left knee Active Problems:   Primary osteoarthritis of right knee  S/p L TKA 11/01/24  Appreciative of hospitalist guidance with orthostatic management. This has significantly improved and patient passed PT yesterday.   Post op recs: WB: WBAT Abx: ancef Imaging: PACU xrays DVT prophylaxis: Aspirin 81mg  BID x4 weeks Follow up: 2 weeks after surgery for a wound check with Dr. Edna at [facility].  Address: [address], [place], [place] [address]  Office Phone: [phone]  Had discussion today regarding discharge home. Staying another night to manage rash vs. Discharging home today to get back on normal zyrtec dosing and into normal environment with less irritants. Patient would like to discharge home.     KRUTI HORACEK 11/07/2024, 8:29 AM    Contact information:   Tzzxijbd 7am-5pm epic message Dr. Edna, or call office for patient follow up: [phone] After hours and holidays please check [email or web address] for group call information for [facility]

## 2024-11-07 NOTE — Progress Notes (Signed)
 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 Subsequently, hospital course complicated by increasing pain and dizziness with inability to work with PT. TRH was consulted on 11/04/2024 for orthostatic hypotension.  She was started on IV fluids.  Assessment & Plan:   Orthostatic hypotension History of hypertension History of Mnire's disease - Continues to have intermittent orthostatic hypotension.  Trying to avoid midodrine as much as possible.  Coreg , triamterene  and hydrochlorothiazide  on hold for now. Monitor blood pressure and orthostatic vitals today.  Labs looked okay.  If orthostatic vitals look okay, patient can be discharged: Recommend to hold Coreg , triamterene /hydrochlorothiazide  till reevaluation by PCP.  If continues to remain orthostatic today, will start midodrine.   History of PVCs - Continue mexiletine.  Outpatient follow-up with cardiology.  Coreg  on hold.  Hyperlipidemia - Continue statin   Status post left total knee arthroplasty - Management as per primary orthopedics team   Hypothyroidism -Continue levothyroxine    Diabetes mellitus type 2 -Blood sugars currently controlled.  Outpatient follow-up with PCP   Hyponatremia - No labs today.  Encourage oral intake.    Hypokalemia - Resolved   Depression - Continue venlafaxine  XR and amitriptyline    Asthma -Stable.  Continue current inhaled regimen      Subjective: Patient seen and examined at bedside.  Feels better.  Denies worsening shortness of breath, chest pain or vomiting.  Objective: Vitals:   11/06/24 1421 11/06/24  2141 11/06/24 2219 11/07/24 0539  BP: (!) 116/56  137/60 135/63  Pulse: 85  87 91  Resp: 16  16 15   Temp: 98.5 F (36.9 C)  100 F (37.8 C) 98 F (36.7 C)  TempSrc:   Oral Oral  SpO2: 97% 98% 100% 94%  Weight:      Height:        Intake/Output Summary (Last 24 hours) at 11/07/2024 0754 Last data filed at 11/07/2024 0200 Gross per 24 hour  Intake 720 ml  Output --  Net 720 ml   Filed Weights   11/01/24 1302  Weight: 110.7 kg    Examination:  General: Remains on room air and in no distress. respiratory: Bilateral decreased breath sounds at bases with scattered crackles  CVS: S1-S2 heard; rate currently controlled abdominal: Soft, obese, nontender, distended mildly, no organomegaly; bowel sounds are heard normally extremities: Left knee has a dressing   Data Reviewed: I have personally reviewed following labs and imaging studies  CBC: Recent Labs  Lab 11/01/24 0622 11/02/24 0400 11/03/24 0345 11/04/24 1355  WBC 6.5 6.8 7.2 7.1  NEUTROABS 4.0  --   --  5.5  HGB 13.5 11.9* 11.8* 10.5*  HCT 42.5 35.8* 35.0* 31.1*  MCV 98.6 98.9 97.0 96.6  PLT 210 160 172 193   Basic Metabolic Panel: Recent Labs  Lab 11/01/24 0622 11/02/24 0400 11/04/24 1355  NA 138 133* 132*  K 3.3* 3.5 3.8  CL 103 98 97*  CO2 24 26 25   GLUCOSE 99 99 135*  BUN 13 13 8   CREATININE 0.90 0.91 0.94  CALCIUM  9.8 8.9 9.2  MG  --   --  2.0   GFR: Estimated Creatinine Clearance: 75.3 mL/min (by C-G formula based on SCr of  0.94 mg/dL). Liver Function Tests: Recent Labs  Lab 11/01/24 0622 11/04/24 1355  AST 21 24  ALT 16 17  ALKPHOS 105 91  BILITOT 0.6 0.4  PROT 6.4* 5.5*  ALBUMIN 4.1 3.2*   No results for input(s): LIPASE, AMYLASE in the last 168 hours. No results for input(s): AMMONIA in the last 168 hours. Coagulation Profile: No results for input(s): INR, PROTIME in the last 168 hours. Cardiac Enzymes: No results for input(s): CKTOTAL, CKMB, CKMBINDEX,  TROPONINI in the last 168 hours. BNP (last 3 results) No results for input(s): PROBNP in the last 8760 hours. HbA1C: No results for input(s): HGBA1C in the last 72 hours. CBG: Recent Labs  Lab 11/01/24 0602 11/01/24 1021  GLUCAP 98 84   Lipid Profile: No results for input(s): CHOL, HDL, LDLCALC, TRIG, CHOLHDL, LDLDIRECT in the last 72 hours. Thyroid Function Tests: No results for input(s): TSH, T4TOTAL, FREET4, T3FREE, THYROIDAB in the last 72 hours. Anemia Panel: No results for input(s): VITAMINB12, FOLATE, FERRITIN, TIBC, IRON, RETICCTPCT in the last 72 hours. Sepsis Labs: No results for input(s): PROCALCITON, LATICACIDVEN in the last 168 hours.  No results found for this or any previous visit (from the past 240 hours).       Radiology Studies: No results found.      Scheduled Meds:  allopurinol   300 mg Oral QHS   amitriptyline   100 mg Oral QHS   aspirin  81 mg Oral BID   Avenova  1 application  Both Eyes BID   budesonide -formoterol   2 puff Inhalation BID   docusate sodium   100 mg Oral BID   fluocinonide  1 Application Topical Daily   fluticasone   1 spray Each Nare Daily   levothyroxine   125 mcg Oral QAC breakfast   loratadine  10 mg Oral Daily   magnesium oxide  400 mg Oral Daily   mexiletine  150 mg Oral BID   nystatin   5 mL Oral QID   nystatin -triamcinolone    Topical BID   pantoprazole   40 mg Oral Daily   pentosan polysulfate  200 mg Oral BID   rosuvastatin   5 mg Oral QHS   triamcinolone  lotion   Topical TID   venlafaxine  XR  150 mg Oral Q breakfast   Continuous Infusions:          Sophie Mao, MD [facility] 11/07/2024, 7:54 AM

## 2024-11-07 NOTE — Progress Notes (Signed)
 Physical Therapy Treatment Patient Details Name: Kelly Thornton MRN: [id number] DOB: March 28, 1961 Today's Date: 11/07/2024   History of Present Illness Pt is 63 yo female s/p L TKA on 11/01/24.  Ongoing hospital stay due to orthostatic hypotension. Pt with hx including but not limited to adrenal mass, Raynaud's disease, orthostatic BP, hearing loss bil (R worse than L), obesity, DM2, arthritis, anxiety, fibromyalgia, HLD, HTN, reports Meniere's. Has prolonged ortho stay due to orthostatics, meneries , and now most current a whole body rash. Doing well from PT standpoint    PT Comments  Pt doing well with pain tolerance and mobility requiring minimal to no assistance. Main issue at this time is her entire body rash with extreme locations in hotter locations ( between leaves, on her back, etc) . Did remove the ACE wraps due to they were bunched up. Did not observe a gross pattern of rash under ACE so did replace with new wraps. Pt does have reactions to elastics, so replaced elastic fitted sheets with dermatherapy sheets and acquired a fan to assist with hotter aggravated areas.    Pt mobility and strength in LLE is improving, if patient stays will continue to follow for ambulation daily and knee exercise with focus on knee flexion for ROM as well. Quad strength is improving with SLR and short arc quad exercises almost independent.    If plan is discharge home, recommend the following: A little help with walking and/or transfers;A little help with bathing/dressing/bathroom;Assistance with cooking/housework;Help with stairs or ramp for entrance   Can travel by private vehicle        Equipment Recommendations  Rolling walker (2 wheels) (already delviered to the room)    Recommendations for Other Services       Precautions / Restrictions Precautions Precautions: Knee Restrictions Weight Bearing Restrictions Per Provider Order: Yes LLE Weight Bearing Per Provider Order: Weight bearing as  tolerated     Mobility  Bed Mobility Overal bed mobility: Needs Assistance Bed Mobility: Sit to Supine     Supine to sit: Min assist Sit to supine: Supervision   General bed mobility comments: Used gait belt to assist LE    Transfers Overall transfer level: Needs assistance Equipment used: Rolling walker (2 wheels) Transfers: Sit to/from Stand Sit to Stand: Min assist, Supervision (if surface is low, but has all surfaces at home high height)           General transfer comment: From chair    Ambulation/Gait Ambulation/Gait assistance: Contact guard assist, Supervision Gait Distance (Feet): 150 Feet Assistive device: Rolling walker (2 wheels) Gait Pattern/deviations: Step-through pattern, Decreased stride length, Decreased weight shift to left Gait velocity: decreased but functional   Pre-gait activities: did static standing for about 5 minutes to cool her back off due to extreme rash on her back General Gait Details: Progressed to supervision, denies dizziness or lightheadedness, steady gait   Stairs             Wheelchair Mobility     Tilt Bed    Modified Rankin (Stroke Patients Only)       Balance Overall balance assessment: Needs assistance Sitting-balance support: No upper extremity supported, Feet supported Sitting balance-Leahy Scale: Normal     Standing balance support: Bilateral upper extremity supported, Reliant on assistive device for balance Standing balance-Leahy Scale: Fair Standing balance comment: Able to static stand wtihout support with decreased swaying today  Communication    Cognition Arousal: Alert Behavior During Therapy: WFL for tasks assessed/performed   PT - Cognitive impairments: No apparent impairments                         Following commands: Intact      Cueing Cueing Techniques: Verbal cues, Gestural cues  Exercises Total Joint Exercises Ankle Circles/Pumps:  AROM, Both, 10 reps, Supine Quad Sets: AROM, Both, 10 reps, Supine Short Arc Quad: AROM, Left, 10 reps, Supine Heel Slides: AAROM, Left, 10 reps, Supine, Limitations Heel Slides Limitations: assisted with tying to get further knee flexion stretch at end range Straight Leg Raises: AROM, Left, 10 reps, Supine Goniometric ROM: 0-70 supine    General Comments        Pertinent Vitals/Pain Pain Assessment Pain Assessment: 0-10 Pain Score: 3  Pain Location: L knee Pain Descriptors / Indicators: Sore (stiff and tight but pain controlled)    Home Living                          Prior Function            PT Goals (current goals can now be found in the care plan section) Acute Rehab PT Goals Patient Stated Goal: return home PT Goal Formulation: With patient/family Time For Goal Achievement: 11/15/24 Potential to Achieve Goals: Good    Frequency    7X/week      PT Plan      Co-evaluation              AM-PAC PT 6 Clicks Mobility   Outcome Measure  Help needed turning from your back to your side while in a flat bed without using bedrails?: None Help needed moving from lying on your back to sitting on the side of a flat bed without using bedrails?: None Help needed moving to and from a bed to a chair (including a wheelchair)?: A Little Help needed standing up from a chair using your arms (e.g., wheelchair or bedside chair)?: A Little Help needed to walk in hospital room?: A Little Help needed climbing 3-5 steps with a railing? : A Little 6 Click Score: 20    End of Session Equipment Utilized During Treatment: Gait belt Activity Tolerance: Patient tolerated treatment well Patient left: with call bell/phone within reach;with family/visitor present;in bed Nurse Communication: Mobility status PT Visit Diagnosis: Other abnormalities of gait and mobility (R26.89);Muscle weakness (generalized) (M62.81)     Time: 1010-1106 PT Time Calculation (min) (ACUTE  ONLY): 56 min  Charges:    $Gait Training: 8-22 mins $Therapeutic Exercise: 8-22 mins PT General Charges $$ ACUTE PT VISIT: 1 Visit                    Miliano Cotten, PT, MPT Acute Rehabilitation Services Office: [phone] If a weekend: secure chat groups: [facility] PT, [facility] OT, [facility] SLP 11/07/2024     Breccan Galant 11/07/2024, 11:20 AM

## 2024-12-08 ENCOUNTER — Ambulatory Visit (INDEPENDENT_AMBULATORY_CARE_PROVIDER_SITE_OTHER)

## 2024-12-08 ENCOUNTER — Encounter (INDEPENDENT_AMBULATORY_CARE_PROVIDER_SITE_OTHER): Payer: Self-pay

## 2024-12-08 VITALS — BP 139/87 | HR 58 | Ht 66.0 in | Wt 244.0 lb

## 2024-12-08 DIAGNOSIS — H8103 Meniere's disease, bilateral: Secondary | ICD-10-CM | POA: Diagnosis not present

## 2024-12-08 DIAGNOSIS — H8093 Unspecified otosclerosis, bilateral: Secondary | ICD-10-CM

## 2024-12-08 DIAGNOSIS — H60313 Diffuse otitis externa, bilateral: Secondary | ICD-10-CM

## 2024-12-08 DIAGNOSIS — H6123 Impacted cerumen, bilateral: Secondary | ICD-10-CM | POA: Diagnosis not present

## 2024-12-08 DIAGNOSIS — H6063 Unspecified chronic otitis externa, bilateral: Secondary | ICD-10-CM | POA: Diagnosis not present

## 2024-12-08 MED ORDER — CLOBETASOL PROPIONATE 0.05 % EX SOLN: 1.0000 | Freq: Every day | CUTANEOUS | 2 refills | Status: AC

## 2024-12-08 NOTE — Progress Notes (Signed)
 Dear Dr. Regino, Here is my assessment for our mutual patient, Kelly Thornton. Thank you for allowing me the opportunity to care for your patient. Please do not hesitate to contact me should you have any other questions. Sincerely, Dr. Hadassah Parody  Otolaryngology Clinic Note Referring provider: Dr. Regino HPI:   Initial HPI (12/08/2024) Discussed the use of AI scribe software for clinical note transcription with the patient, who gave verbal consent to proceed.  History of Present Illness Kelly Thornton is a 63 year old female with Meniere's disease, bilateral otosclerosis and chronic otitis externa who presents with ear irritation and bleeding.  Prior pt of Dr. Thaddeus.   She gets intermittent squamous debris in her ear canal which requires 3-6 month check ups. Clobetasol  scalp solution has been the most helpful for her. Without topical treatment, ears turn red, scab, and become problematic   Meniere's disease exacerbation - Meniere's symptoms worsen with adjustment of blood pressure medication following knee replacement surgery in November - Reduction of triamterene /HCTZ dosage worsens Meniere's symptoms but it is tolerable currently   Hearing status - Uses hearing aids, with occasional feedback issues but otherwise functioning well - Last hearing test conducted in December of previous year; due for repeat testing  Otosclerosis history - History of otosclerosis with prosthesis placed on left side - Under otolaryngology care since the 1980s, with visits twice a year or more frequently if symptoms worsen    Independent Review of Additional Tests or Records:  Referral note 10/13/24 Dibas Koirala, MD: persistent left ear lesion    PMH/Meds/All/SocHx/FamHx/ROS:   Past Medical History:  Diagnosis Date   Abnormal uterine bleeding    Anxiety    Arthritis    Cataract    Chronic interstitial cystitis    Colitis, ischemic    Cough variant asthma 04/02/2018   FENO 04/02/2018  =    11 on advair 250 one bid - Spirometry 04/02/2018  FEV1 2.39 (84%)  Ratio 87 with truncation of peak flow p advair prior  - 04/02/2018  After extensive coaching inhaler device  effectiveness =    75% from a baseline of 50% so try symbicort  80  1-2 bid     Cushing disease (HCC)    Depression    Dermatitis of both ear canals    Diabetes mellitus without complication (HCC)    borderline   Dyspnea    Dysrhythmia    Endometriosis    Fibromyalgia    GERD (gastroesophageal reflux disease)    Gout    Hearing loss    Hyperlipidemia    Hypertension    Hypothyroidism    Irritable bowel syndrome (IBS)    Meniere disease    Bilateral   Morbid obesity (HCC)    Nonsustained ventricular tachycardia (HCC)    Osteopenia    Pneumonia    PONV (postoperative nausea and vomiting)    PVC (premature ventricular contraction)    Seasonal allergies    Sjogren's disease    Tachycardia    Tinnitus    Wears hearing aid in both ears      Past Surgical History:  Procedure Laterality Date   BIOPSY  08/15/2020   Procedure: BIOPSY;  Surgeon: Dianna Specking, MD;  Location: [facility];  Service: Endoscopy;;   bladder stretching     x2   COLONOSCOPY WITH PROPOFOL  N/A 08/15/2020   Procedure: COLONOSCOPY WITH PROPOFOL ;  Surgeon: Dianna Specking, MD;  Location: [facility];  Service: Endoscopy;  Laterality: N/A;   CYSTOSCOPY  DIAGNOSTIC LAPAROSCOPY     x4   DILATION AND CURETTAGE OF UTERUS     ENDOMETRIAL BIOPSY     EXCISION OF TONGUE LESION Left 10/02/2018   Procedure: EXCISION OF TONGUE LESION;  Surgeon: Mable Lenis, MD;  Location: [facility] OR;  Service: ENT;  Laterality: Left;   EYE SURGERY     stye removed   HAMMER TOE SURGERY Right 2023   MYELOGRAM  1985   NASAL SEPTUM SURGERY     papiloma  2014   under tongue   ROBOTIC ADRENALECTOMY Left 11/18/2023   Procedure: XI ROBOTIC LEFT ADRENALECTOMY;  Surgeon: Devere Lonni Righter, MD;  Location: [facility];  Service: Urology;  Laterality: Left;    STAPEDECTOMY     TONSILLECTOMY AND ADENOIDECTOMY     TOTAL KNEE ARTHROPLASTY Left 11/01/2024   Procedure: ARTHROPLASTY, KNEE, TOTAL;  Surgeon: Edna Toribio LABOR, MD;  Location: [facility];  Service: Orthopedics;  Laterality: Left;   TUBAL LIGATION     WISDOM TOOTH EXTRACTION      Family History  Problem Relation Age of Onset   Breast cancer Maternal Aunt    Breast cancer Maternal Grandmother    Cancer Other    Hyperlipidemia Other    Hypertension Other    Heart disease Other    Sleep apnea Other      Social Connections: Unknown (04/30/2023)   Received from [facility]   Social Network    Social Network: Not on file     Current Outpatient Medications  Medication Instructions   albuterol  (PROVENTIL  HFA;VENTOLIN  HFA) 108 (90 BASE) MCG/ACT inhaler 2 puffs, Every 6 hours PRN   albuterol  (PROVENTIL ) 2.5 mg, Every 6 hours PRN   allopurinol  (ZYLOPRIM ) 300 mg, Daily at bedtime   amitriptyline  (ELAVIL ) 100 mg, Daily at bedtime   BIOTIN PO 2,500 mcg, Daily at bedtime   budesonide -formoterol  (SYMBICORT ) 80-4.5 MCG/ACT inhaler Take 2 puffs first thing in am and then another 2 puffs about 12 hours later.   calcium  carbonate (OSCAL) 1500 (600 Ca) MG TABS tablet 600 mg of elemental calcium , Daily at bedtime   carboxymethylcellulose (REFRESH PLUS) 0.5 % SOLN 1 drop, 3 times daily PRN   carvedilol  (COREG ) 12.5 mg, Oral, 2 times daily   cetirizine (ZYRTEC) 10 mg, 2 times daily   Cholecalciferol (VITAMIN D3 PO) 2,000 Units, Every morning   clobetasol  (TEMOVATE ) 0.05 % external solution 1 Application, Topical, Daily   clotrimazole -betamethasone  (LOTRISONE ) cream 1 Application, 2 times daily PRN   cromolyn  (NASALCROM ) 5.2 MG/ACT nasal spray 1 spray, 2 times daily PRN   desvenlafaxine (PRISTIQ) 100 mg, Every morning   docusate sodium  (COLACE) 100 mg, 2 times daily   EPINEPHrine  (EPI-PEN) 0.3 mg, As needed   Eyelid Cleansers (AVENOVA) 0.01 % SOLN 1 application , 2 times daily   fluocinonide   (LIDEX ) 0.05 % external solution 1 Application, Daily   fluticasone  (FLONASE ) 50 MCG/ACT nasal spray 1 spray, 2 times daily   FT Vitamin K2 100 mcg, Daily at bedtime   levothyroxine  (SYNTHROID ) 0.125 mcg, Daily before breakfast   magnesium  oxide (MAG-OX) 400 mg, Every morning   mexiletine (MEXITIL ) 150 mg, Oral, 2 times daily   NASAL SALINE NA 2 sprays, Daily   nystatin  (MYCOSTATIN ) 500,000 Units, Oral, 4 times daily, Until symptoms resolve   nystatin -triamcinolone  ointment (MYCOLOG) 1 Application, Topical, 2 times daily   omeprazole  (PRILOSEC) 40 mg, Oral, Daily   Ozempic (2 MG/DOSE) 2 mg, Every Sun   Pentosan Polysulfate Sodium  200 mg, 2 times daily  rosuvastatin  (CRESTOR ) 5 mg, Daily at bedtime   Simethicone  (GAS-X ULTRA STRENGTH PO) 250 mg, 2 times daily   triamterene -hydrochlorothiazide  (MAXZIDE -25) 37.5-25 MG tablet 1 tablet, Every morning     Physical Exam:   BP 139/87 (BP Location: Left Arm, Patient Position: Sitting)   Pulse (!) 58   Ht 5' 6 (1.676 m)   Wt 244 lb (110.7 kg)   SpO2 91%   BMI 39.38 kg/m   Salient findings:  CN II-XII intact Given history and complaints, ear microscopy was indicated and performed for evaluation with findings as below in physical exam section and in procedures  Left: EAC obstructed with cerumen. This was carefully removed. Additional cerumen removed off of the TM. TM was intact. Middle ear was aerated. Drainage: none Right: EAC was obstructed. This was carefully removed. Additional cerumen and debris cleaned off the TM. TM was intact. Middle ear was aerated . Drainage: no Patient tolerated the procedure well.  No obviously palpable neck masses/lymphadenopathy/thyromegaly No respiratory distress or stridor  Seprately Identifiable Procedures:  Prior to initiating any procedures, risks/benefits/alternatives were explained to the patient and verbal consent obtained.  Procedure (12/08/2024): Bilateral ear microscopy and cerumen removal using  microscope (CPT [phone]) - Mod 25-Bilateral  Pre-procedure diagnosis: Bilateral Cerumen impaction  Post-procedure diagnosis: same Indication: Bilateral cerumen impaction; given patient's otologic complaints and history as well as for improved and comprehensive examination of external ear and tympanic membrane, bilateral otologic examination using microscope was performed and impacted cerumen removed  Procedure: Patient was placed semi-recumbent. Both ear canals were examined using the microscope with findings above. Cerumen removed on left and on right using suction and currette with improvement in EAC examination and patency. Left: EAC obstructed with cerumen. This was carefully removed. Additional cerumen removed off of the TM. TM was intact. Middle ear was aerated. Drainage: none Right: EAC was obstructed. This was carefully removed. Additional cerumen and debris cleaned off the TM. TM was intact. Middle ear was aerated . Drainage: no Patient tolerated the procedure well.   Impression & Plans:  Kelly Thornton is a 63 y.o. female with   1. Chronic diffuse otitis externa of both ears   2. Meniere's disease of both ears   3. Otosclerosis of both ears   4. Bilateral impacted cerumen     Assessment and Plan Assessment & Plan Chronic otitis externa, bilateral Bilateral chronic otitis externa with irritation and bleeding in the ear canals. The left ear is more affected. Previous treatment with clotrimazole  betamethasone  cream was ineffective, but clobetasol  scalp solution provided relief for itching and scaling. Debris on the eardrum was removed during the visit. - Ordered clobetasol  scalp solution with refills for daily use as needed - Scheduled follow-up in three months with an audiogram  Bilateral cerumen impaction - Treated today   Meniere's disease, bilateral Bilateral Meniere's disease with worsening symptoms when reducing diuretic medication post-knee replacement surgery. Blood  pressure management is challenging due to orthostatic hypotension, but symptoms are tolerable. - Continue current management and monitor symptoms  Otosclerosis, bilateral Bilateral otosclerosis with a prosthesis on one side. Hearing aids are providing feedback but are otherwise effective. - Ordered audiogram to monitor hearing status - Continue current hearing aid management   See below regarding exact medications prescribed this encounter including dosages and route: Meds ordered this encounter  Medications   clobetasol  (TEMOVATE ) 0.05 % external solution    Sig: Apply 1 Application topically daily.    Dispense:  50 mL    Refill:  2    Thank you for allowing me the opportunity to care for your patient. Please do not hesitate to contact me should you have any other questions.  Sincerely, Hadassah Parody, MD Otolaryngologist (ENT), [facility] Phone: [phone] Fax: [phone]  MDM:  Level 4 Complexity/Problems addressed: 4-multiple chronic problems Data complexity: 2- independent review of referral note - Morbidity: 4- prescription drug  - Prescription Drug prescribed or managed: yes

## 2024-12-13 ENCOUNTER — Ambulatory Visit: Admitting: Internal Medicine

## 2024-12-13 ENCOUNTER — Encounter: Payer: Self-pay | Admitting: Internal Medicine

## 2024-12-13 VITALS — BP 132/76 | HR 89 | Ht 66.0 in | Wt 246.8 lb

## 2024-12-13 DIAGNOSIS — J45991 Cough variant asthma: Secondary | ICD-10-CM | POA: Diagnosis not present

## 2024-12-13 MED ORDER — BUDESONIDE-FORMOTEROL FUMARATE 80-4.5 MCG/ACT IN AERO: INHALATION_SPRAY | RESPIRATORY_TRACT | 12 refills | Status: AC

## 2024-12-13 MED ORDER — METHYLPREDNISOLONE ACETATE 80 MG/ML IJ SUSP
120.0000 mg | Freq: Once | INTRAMUSCULAR | Status: AC
  Administered 2024-12-13: 120 mg via INTRAMUSCULAR

## 2024-12-13 NOTE — Progress Notes (Unsigned)
 "   Patient ID: Kelly Thornton, female   DOB: 1961-04-14,     MRN: [id number]    Brief patient profile:  63  yowf  never smoker works as probation officer for [employer] with sinus problems at age 63 = unable to breath thru nose but subsequently pattern of freq sinusitis developed req for abx  rx up to 4 x per year then around age 63 developed more of a chronic asthma pattern on advair since around 2001 and on it ever since and starting in Sept 2018 more doe so referred to pulmonary clinic 04/02/2018 by Dr  Regino   Dr Jama Sinus surgery 1984 Kelly Thornton's eval dust, ragweed  Kelly Thornton for Sjogren's    History of Present Illness  04/02/2018 1st [facility] office visit/ Kelly Thornton   Chief Complaint  Patient presents with   Pulmonary Consult    Referred by Dr. Regino. Pt states she was dxed with Asthma in 1990. She has had increased SOB and wheezing x 8 months.   new onset sob assoc with throat clearing  X sept 2018 / ok sleeping on side  Also chronic dry mouth secondary to Sjogren's Using rescue inhaler once a month now avg  twice daily / worse with lysol exp  Doe = MMRC3 = can't walk 100 yards even at a slow pace at a flat grade s stopping due to sob rec Stop advair  Start Symbicort  80 Take 1  puffs first thing in am and then another 1 puffs about 12 hours later Work on inhaler technique:  Only use your albuterol  as a rescue medication GERD diet     11/06/2021  Re-establish ov/Kelly Thornton Start re: asthma/rhinitis well controlled on prn symbicort  one bid then ran out Oct 1st 2022 first day of covid symptom Oct 19 2021   rx neb prn s specific rx while still on coreg   12.5 mg bid  Chief Complaint  Patient presents with   Acute Visit    Day 18 since tested positive for covid.  Chest congestion with green mucous.  Out of Symbicort  x 3 weeks.   Dyspnea:  MMRC1 = can walk nl pace, flat grade, can't hurry or go uphills or steps s sob   Cough: congested cough / nasal congestion on saline /flonase  /  slt green  tinged mucus assoc with nasal congestion Sleeping: L side down one pillow flat bed  SABA use: twice daily  02: none Covid status:   vax x 3   Rec For cough > mucinex dm 1200 mg every 12hours and if still can't stop coughing > Try prilosec otc 20mg   Take 30-60 min before first meal of the day and Pepcid  ac (famotidine ) 20 mg one @  bedtime until cough is completely gone for at least a week without the need for cough suppression Zpak  Prednisone  10 mg take  4 each am x 2 days,   2 each am x 2 days,  1 each am x 2 days and stop  Resume symbicort  80 Take 2 puffs first thing in am and then another 2 puffs about 12 hours later and wean after a week     01/16/2023  f/u ov/Kelly Thornton re: rhinitis/ sinusitis/ asthma    maint on symbicort  80 2bid  s/p RSV/rx 3 courses of pred/ 2 of levaquin  last dose one week Chief Complaint  Patient presents with   Follow-up    Lungs doing well.  C/o nasal and ear congestion persistent.  Completed abt  Dyspnea:  cane walk/ knees limiting more than breathing Cough: improved since RSV/ still nasal drainage still green but reported pan allergic to abx x for levaquin which didn't work  Sleeping: flat position/ cough worse on back  SABA use: flat bed one pillow 02: none  Rec My office will be contacting you by phone for referral to ENT and sinus ct  - CT sinus 02/03/23 > nl - referred to ENT 01/16/2023 >>> neg [address]  eval > allergy  testing > HP neg        11/17/2023  surgical clearance  ov/Kelly Thornton re: asthma/ rhinitis   maint on symbicort  80 one bid  no flares in months  Chief Complaint  Patient presents with   Follow-up    Breathing doing well.  Surgery tomorrow - removal of L adrenal gland.  New dx of Cushing's Syndrome and Ischemic Colitis.   Dyspnea:  limited by knees/ rollator grocery =[employer] no  limiting doe  Cough: none  Sleeping: bed is flat, on side/ one pillow s  resp cc  SABA use: none in months  02: none    Rec No change in medications needed   Early mobilization, use of Incentive spirometry, minimize pain medications   L adrenal Gland for cushings 11/19/23  L knee11/11/ 2025       12/13/2024  yearly f/u ov/Kelly Thornton re: asthma   maint on symbicort  80 1- 2bid   Chief Complaint  Patient presents with   Medical Management of Chronic Issues   Asthma    Breathing has been slightly worse since Nov 2025. She had left [facility] Nov 2025. She has noticed occ wheezing and has been using her albuterol  inhaler about 2 x per wk.  Dyspnea:  still using rollator for  church and grocery= [employer]  Cough: none  Sleeping: flat bed on side one pillow s  resp cc  SABA use: not needing  but has inhaler  02: none  Wheezing since November  No obvious day to day or daytime variability or assoc excess/ purulent sputum or mucus plugs or hemoptysis or cp or chest tightness,   overt sinus or hb symptoms.    Also denies any obvious fluctuation of symptoms with weather or environmental changes or other aggravating or alleviating factors except as outlined above   No unusual exposure hx or h/o childhood pna/ asthma or knowledge of premature birth.  Current Allergies, Complete Past Medical History, Past Surgical History, Family History, and Social History were reviewed in [facility] record.  ROS  The following are not active complaints unless bolded Hoarseness, sore throat, dysphagia, dental problems, itching, sneezing,  nasal congestion or discharge of excess mucus or purulent secretions, ear ache,   fever, chills, sweats, unintended wt loss or wt gain, classically pleuritic or exertional cp,  orthopnea pnd or arm/hand swelling  or leg swelling, presyncope, palpitations, abdominal pain, anorexia, nausea, vomiting, diarrhea  or change in bowel habits or change in bladder habits, change in stools or change in urine, dysuria, hematuria,  rash, arthralgias, visual complaints, headache, numbness, weakness or ataxia or problems with walking or  coordination,  change in mood or  memory.          Outpatient Medications Prior to Visit  Medication Sig Dispense Refill   albuterol  (PROVENTIL  HFA;VENTOLIN  HFA) 108 (90 BASE) MCG/ACT inhaler Inhale 2 puffs into the lungs every 6 (six) hours as needed for wheezing or shortness of breath.     albuterol  (PROVENTIL ) (2.5 MG/3ML) 0.083%  nebulizer solution Take 2.5 mg by nebulization every 6 (six) hours as needed for wheezing or shortness of breath.     allopurinol  (ZYLOPRIM ) 300 MG tablet Take 300 mg by mouth at bedtime.     amitriptyline  (ELAVIL ) 100 MG tablet Take 100 mg by mouth at bedtime.     BIOTIN PO Take 2,500 mcg by mouth at bedtime.     budesonide -formoterol  (SYMBICORT ) 80-4.5 MCG/ACT inhaler Take 2 puffs first thing in am and then another 2 puffs about 12 hours later. (Patient taking differently: Inhale 1 puff into the lungs in the morning and at bedtime.) 1 each 12   calcium  carbonate (OSCAL) 1500 (600 Ca) MG TABS tablet Take 600 mg of elemental calcium  by mouth at bedtime.     carboxymethylcellulose (REFRESH PLUS) 0.5 % SOLN Place 1 drop into both eyes 3 (three) times daily as needed (dry eyes).     carvedilol  (COREG ) 12.5 MG tablet TAKE 1 TABLET BY MOUTH TWICE  DAILY 180 tablet 3   cetirizine (ZYRTEC) 10 MG tablet Take 10 mg by mouth 2 (two) times daily.     Cholecalciferol (VITAMIN D3 PO) Take 2,000 Units by mouth in the morning.     clotrimazole -betamethasone  (LOTRISONE ) cream Apply 1 Application topically 2 (two) times daily as needed (skin irritation.).     cromolyn  (NASALCROM ) 5.2 MG/ACT nasal spray Place 1 spray into both nostrils 2 (two) times daily as needed for allergies.     desvenlafaxine (PRISTIQ) 100 MG 24 hr tablet Take 100 mg by mouth in the morning.     docusate sodium  (COLACE) 100 MG capsule Take 100 mg by mouth 2 (two) times daily.     EPINEPHrine  0.3 mg/0.3 mL IJ SOAJ injection Inject 0.3 mg into the muscle as needed for anaphylaxis (from seafood).      Eyelid  Cleansers (AVENOVA) 0.01 % SOLN Place 1 application into both eyes 2 (two) times daily.     fluocinonide  (LIDEX ) 0.05 % external solution Apply 1 Application topically daily.     fluticasone  (FLONASE ) 50 MCG/ACT nasal spray Place 1 spray into both nostrils in the morning and at bedtime.     levothyroxine  (SYNTHROID ) 175 MCG tablet Take 0.125 mcg by mouth daily before breakfast.     magnesium  oxide (MAG-OX) 400 (240 Mg) MG tablet Take 400 mg by mouth in the morning.     Menaquinone-7 (FT VITAMIN K2) 100 MCG CAPS Take 100 mcg by mouth at bedtime.     mexiletine (MEXITIL ) 150 MG capsule TAKE 1 CAPSULE BY MOUTH TWICE  DAILY 180 capsule 3   NASAL SALINE NA Place 2 sprays into both nostrils daily.     nystatin  (MYCOSTATIN ) 100000 UNIT/ML suspension Take 5 mLs (500,000 Units total) by mouth 4 (four) times daily. Until symptoms resolve 60 mL 0   nystatin -triamcinolone  ointment (MYCOLOG) Apply 1 Application topically 2 (two) times daily. (Patient taking differently: Apply 1 Application topically 2 (two) times daily as needed (irritation.).) 60 g 2   OZEMPIC, 2 MG/DOSE, 8 MG/3ML SOPN Inject 2 mg into the skin every Sunday.     Pentosan Polysulfate Sodium  200 MG CPDR Take 200 mg by mouth 2 (two) times daily.     rosuvastatin  (CRESTOR ) 5 MG tablet Take 5 mg by mouth at bedtime.      Simethicone  (GAS-X ULTRA STRENGTH PO) Take 250 mg by mouth in the morning and at bedtime.     triamterene -hydrochlorothiazide  (MAXZIDE -25) 37.5-25 MG tablet Take 1 tablet by mouth in the morning.  clobetasol  (TEMOVATE ) 0.05 % external solution Apply 1 Application topically daily. 50 mL 2   omeprazole  (PRILOSEC) 40 MG capsule Take 1 capsule (40 mg total) by mouth daily for 21 days. 21 capsule 0   No facility-administered medications prior to visit.         Objective:   Physical Exam    Wts  12/13/2024        246  11/17/2023        273  01/16/2023         284  11/18/2022       298  11/06/2021       338  12/04/2018       367  08/26/2018          376  05/19/2018         376   04/02/18 (!) 381 lb (172.8 kg)  06/14/14 (!) 353 lb 14.4 oz (160.5 kg)    Vital signs reviewed  12/13/2024  - Note at rest 02 sats  100% on RA   General appearance:    amb pleasant wf nad   HEENT : Oropharynx  clear      Nasal turbinates nl   NECK :  without  apparent JVD/ palpable Nodes/TM    LUNGS: no acc muscle use,  Nl contour chest   with a few sonorous rhonchi bilaterally    CV:  RRR  no s3 or murmur or increase in P2, and no edema   ABD:  obese soft and nontender   MS:    ext warm without deformities Or obvious joint restrictions  calf tenderness, cyanosis or clubbing    SKIN: warm and dry without lesions    NEURO:  alert, approp, nl sensorium with  no motor or cerebellar deficits apparent.      Assessment:       Assessment & Plan Cough variant asthma Onset around age 49 with background of recurrent sinus infections FENO 04/02/2018  =   11 on advair 250 one bid - Spirometry 04/02/2018  FEV1 2.39 (84%)  Ratio 87 with truncation of peak flow p advair prior  - 04/02/2018   try symbicort  80  1-2 bid   - PFT's  05/19/2018  FEV1 2.60 (90 % ) ratio 91  p 17 % improvement from saba p nothing prior to study(since symb 80 x 2 x  12h prior ) with DLCO  81 % corrects to 122  % for alv volume   - 05/19/2018  After extensive coaching inhaler device  effectiveness =    75% from a baseline of 50%  - Allergy  profile 05/19/2018 >  Eos 0.0 /  IgE  < 2 RAST neg  - 08/26/2018  After extensive coaching inhaler device,  effectiveness =  75% (short ti)    - FENO 08/26/2018  =   13 p symb 160 > changed to 80 2bid due to hoarseness/throat clearing  - 01/16/2023  After extensive coaching inhaler device,  effectiveness =    75% (short Ti) > continue symbicort  80 as most of her problem appears to more upper airway in nature (see chronic sinusitis)  - 11/17/2023  continue symbicort  80 1-2 bid    - 12/13/2024  After extensive coaching inhaler device,   effectiveness =    75%  (short Ti)  Despite suboptimal hfa, doing well on symbicort  80 prn Based on two studies from NEJM  378; 20 p 1865 (2018) and 380 : p2020-30 (2019) in pts with  mild asthma it is reasonable to use low dose symbicort  eg 80 2bid prn flare in this setting but I emphasized this was only shown with symbicort  and takes advantage of the rapid onset of action but is not the same as rescue therapy but can be stopped once the acute symptoms have resolved and the need for rescue has been minimized (< 2 x weekly)    She is still recovering from Hip surgery with a few residual wheezes / rhonchi on exam so rec depomedrol 120 mg IM but no change in maint ics dose for now  Discussed in detail all the  indications, usual  risks and alternatives  relative to the benefits with patient who agrees to proceed with Rx as outlined.             Each maintenance medication was reviewed in detail including emphasizing most importantly the difference between maintenance and prns and under what circumstances the prns are to be triggered using an action plan format where appropriate.  Total time for H and P, chart review, counseling, reviewing hfa /neb device(s) and generating customized AVS unique to this office visit / same day charting = 25 min         AVS  Patient Instructions  Plan A = Automatic = Always=    Symbicort  80 up to 2 puffs every 12 hour until 100% then ok to taper but not below one puff  Work on inhaler technique:  relax and gently blow all the way out then take a nice smooth full deep breath back in, triggering the inhaler at same time you start breathing in.  Hold breath in for at least  5 seconds if you can. Blow out symbicort   thru nose. Rinse and gargle with water  when done.  If mouth or throat bother you at all,  try brushing teeth/gums/tongue with arm and hammer toothpaste/ make a slurry and gargle and spit out.       Plan B = Backup (to supplement plan A, not to replace  it) Use your albuterol  inhaler as a rescue medication to be used if you can't catch your breath by resting or slowing your pace  or doing a relaxed purse lip breathing pattern.  - The less you use it, the better it will work when you need it. - Ok to use the inhaler up to 2 puffs  every 4 hours if you must but call for appointment if use goes up over your usual need - Don't leave home without it !!  (think of it like the spare tire or starter fluid for your car)   Plan C = Crisis (instead of Plan B but only if Plan B stops working) - only use your albuterol  nebulizer if you first try Plan B and it fails to help > ok to use the nebulizer up to every 4 hours but if start needing it regularly call for immediate appointment  Depomedrol 120 mg IM now  Please schedule a follow up visit in 6  months but call sooner if needed       Ozell America, MD 12/14/2024     "

## 2024-12-13 NOTE — Patient Instructions (Signed)
 Plan A = Automatic = Always=    Symbicort  80 up to 2 puffs every 12 hour until 100% then ok to taper but not below one puff  Work on inhaler technique:  relax and gently blow all the way out then take a nice smooth full deep breath back in, triggering the inhaler at same time you start breathing in.  Hold breath in for at least  5 seconds if you can. Blow out symbicort   thru nose. Rinse and gargle with water  when done.  If mouth or throat bother you at all,  try brushing teeth/gums/tongue with arm and hammer toothpaste/ make a slurry and gargle and spit out.       Plan B = Backup (to supplement plan A, not to replace it) Use your albuterol  inhaler as a rescue medication to be used if you can't catch your breath by resting or slowing your pace  or doing a relaxed purse lip breathing pattern.  - The less you use it, the better it will work when you need it. - Ok to use the inhaler up to 2 puffs  every 4 hours if you must but call for appointment if use goes up over your usual need - Don't leave home without it !!  (think of it like the spare tire or starter fluid for your car)   Plan C = Crisis (instead of Plan B but only if Plan B stops working) - only use your albuterol  nebulizer if you first try Plan B and it fails to help > ok to use the nebulizer up to every 4 hours but if start needing it regularly call for immediate appointment  Depomedrol 120 mg IM now  Please schedule a follow up visit in 6  months but call sooner if needed

## 2024-12-14 NOTE — Assessment & Plan Note (Addendum)
 Onset around age 63 with background of recurrent sinus infections FENO 04/02/2018  =   11 on advair 250 one bid - Spirometry 04/02/2018  FEV1 2.39 (84%)  Ratio 87 with truncation of peak flow p advair prior  - 04/02/2018   try symbicort  80  1-2 bid   - PFT's  05/19/2018  FEV1 2.60 (90 % ) ratio 91  p 17 % improvement from saba p nothing prior to study(since symb 80 x 2 x  12h prior ) with DLCO  81 % corrects to 122  % for alv volume   - 05/19/2018  After extensive coaching inhaler device  effectiveness =    75% from a baseline of 50%  - Allergy  profile 05/19/2018 >  Eos 0.0 /  IgE  < 2 RAST neg  - 08/26/2018  After extensive coaching inhaler device,  effectiveness =  75% (short ti)    - FENO 08/26/2018  =   13 p symb 160 > changed to 80 2bid due to hoarseness/throat clearing  - 01/16/2023  After extensive coaching inhaler device,  effectiveness =    75% (short Ti) > continue symbicort  80 as most of her problem appears to more upper airway in nature (see chronic sinusitis)  - 11/17/2023  continue symbicort  80 1-2 bid    - 12/13/2024  After extensive coaching inhaler device,  effectiveness =    75%  (short Ti)  Despite suboptimal hfa, doing well on symbicort  80 prn Based on two studies from NEJM  378; 20 p 1865 (2018) and 380 : p2020-30 (2019) in pts with mild asthma it is reasonable to use low dose symbicort  eg 80 2bid prn flare in this setting but I emphasized this was only shown with symbicort  and takes advantage of the rapid onset of action but is not the same as rescue therapy but can be stopped once the acute symptoms have resolved and the need for rescue has been minimized (< 2 x weekly)    She is still recovering from Hip surgery with a few residual wheezes / rhonchi on exam so rec depomedrol 120 mg IM but no change in maint ics dose for now  Discussed in detail all the  indications, usual  risks and alternatives  relative to the benefits with patient who agrees to proceed with Rx as outlined.              Each maintenance medication was reviewed in detail including emphasizing most importantly the difference between maintenance and prns and under what circumstances the prns are to be triggered using an action plan format where appropriate.  Total time for H and P, chart review, counseling, reviewing hfa /neb device(s) and generating customized AVS unique to this office visit / same day charting = 25 min

## 2025-03-07 ENCOUNTER — Ambulatory Visit (INDEPENDENT_AMBULATORY_CARE_PROVIDER_SITE_OTHER): Admitting: Audiology

## 2025-03-07 ENCOUNTER — Ambulatory Visit (INDEPENDENT_AMBULATORY_CARE_PROVIDER_SITE_OTHER)

## 2025-03-09 ENCOUNTER — Ambulatory Visit: Admitting: Nurse Practitioner

## 2025-05-19 ENCOUNTER — Ambulatory Visit: Admitting: Cardiology

## 2025-05-23 ENCOUNTER — Ambulatory Visit: Admitting: Internal Medicine
